# Patient Record
Sex: Female | Born: 1935 | Race: White | Hispanic: Yes | Smoking: Former smoker
Health system: Southern US, Community
[De-identification: ages and names within clinical notes are randomized; demographics above are authoritative.]

## PROBLEM LIST (undated history)

## (undated) DIAGNOSIS — M810 Age-related osteoporosis without current pathological fracture: Secondary | ICD-10-CM

## (undated) DIAGNOSIS — T783XXA Angioneurotic edema, initial encounter: Secondary | ICD-10-CM

## (undated) DIAGNOSIS — F329 Major depressive disorder, single episode, unspecified: Secondary | ICD-10-CM

## (undated) DIAGNOSIS — T464X5A Adverse effect of angiotensin-converting-enzyme inhibitors, initial encounter: Secondary | ICD-10-CM

## (undated) DIAGNOSIS — F0390 Unspecified dementia without behavioral disturbance: Secondary | ICD-10-CM

## (undated) DIAGNOSIS — E119 Type 2 diabetes mellitus without complications: Secondary | ICD-10-CM

## (undated) DIAGNOSIS — I10 Essential (primary) hypertension: Secondary | ICD-10-CM

## (undated) DIAGNOSIS — K5790 Diverticulosis of intestine, part unspecified, without perforation or abscess without bleeding: Secondary | ICD-10-CM

## (undated) DIAGNOSIS — F209 Schizophrenia, unspecified: Secondary | ICD-10-CM

## (undated) DIAGNOSIS — F32A Depression, unspecified: Secondary | ICD-10-CM

## (undated) DIAGNOSIS — K219 Gastro-esophageal reflux disease without esophagitis: Secondary | ICD-10-CM

## (undated) HISTORY — DX: Gastro-esophageal reflux disease without esophagitis: K21.9

## (undated) HISTORY — DX: Age-related osteoporosis without current pathological fracture: M81.0

## (undated) HISTORY — DX: Essential (primary) hypertension: I10

## (undated) HISTORY — DX: Diverticulosis of intestine, part unspecified, without perforation or abscess without bleeding: K57.90

## (undated) HISTORY — DX: Depression, unspecified: F32.A

## (undated) HISTORY — DX: Schizophrenia, unspecified: F20.9

## (undated) HISTORY — DX: Type 2 diabetes mellitus without complications: E11.9

## (undated) HISTORY — DX: Unspecified dementia, unspecified severity, without behavioral disturbance, psychotic disturbance, mood disturbance, and anxiety: F03.90

## (undated) HISTORY — DX: Major depressive disorder, single episode, unspecified: F32.9

## (undated) HISTORY — DX: Adverse effect of angiotensin-converting-enzyme inhibitors, initial encounter: T78.3XXA

## (undated) HISTORY — DX: Angioneurotic edema, initial encounter: T46.4X5A

---

## 2009-09-07 ENCOUNTER — Ambulatory Visit: Payer: Self-pay | Admitting: Cardiology

## 2010-08-16 ENCOUNTER — Emergency Department (HOSPITAL_COMMUNITY)
Admission: EM | Admit: 2010-08-16 | Discharge: 2010-08-16 | Disposition: A | Discharge status: Home / Self Care | Payer: Medicare Other | Attending: Emergency Medicine | Admitting: Emergency Medicine

## 2010-08-16 DIAGNOSIS — R220 Localized swelling, mass and lump, head: Secondary | ICD-10-CM | POA: Insufficient documentation

## 2010-08-16 DIAGNOSIS — E119 Type 2 diabetes mellitus without complications: Secondary | ICD-10-CM | POA: Insufficient documentation

## 2010-08-16 DIAGNOSIS — I10 Essential (primary) hypertension: Secondary | ICD-10-CM | POA: Insufficient documentation

## 2010-08-16 DIAGNOSIS — T783XXA Angioneurotic edema, initial encounter: Secondary | ICD-10-CM | POA: Insufficient documentation

## 2010-08-16 DIAGNOSIS — E0590 Thyrotoxicosis, unspecified without thyrotoxic crisis or storm: Secondary | ICD-10-CM | POA: Insufficient documentation

## 2010-08-16 DIAGNOSIS — I2510 Atherosclerotic heart disease of native coronary artery without angina pectoris: Secondary | ICD-10-CM | POA: Insufficient documentation

## 2010-08-16 DIAGNOSIS — Z79899 Other long term (current) drug therapy: Secondary | ICD-10-CM | POA: Insufficient documentation

## 2010-08-16 DIAGNOSIS — X58XXXA Exposure to other specified factors, initial encounter: Secondary | ICD-10-CM | POA: Insufficient documentation

## 2010-08-16 DIAGNOSIS — R059 Cough, unspecified: Secondary | ICD-10-CM | POA: Insufficient documentation

## 2010-08-16 DIAGNOSIS — R05 Cough: Secondary | ICD-10-CM | POA: Insufficient documentation

## 2010-12-05 ENCOUNTER — Other Ambulatory Visit: Payer: Self-pay | Admitting: Family Medicine

## 2010-12-05 DIAGNOSIS — Z1231 Encounter for screening mammogram for malignant neoplasm of breast: Secondary | ICD-10-CM

## 2010-12-17 ENCOUNTER — Ambulatory Visit
Admission: RE | Admit: 2010-12-17 | Discharge: 2010-12-17 | Disposition: A | Discharge status: Home / Self Care | Payer: Medicare Other | Source: Ambulatory Visit | Attending: Family Medicine | Admitting: Family Medicine

## 2010-12-17 DIAGNOSIS — Z1231 Encounter for screening mammogram for malignant neoplasm of breast: Secondary | ICD-10-CM

## 2011-03-05 LAB — HM COLONOSCOPY

## 2011-06-24 DIAGNOSIS — F200 Paranoid schizophrenia: Secondary | ICD-10-CM | POA: Diagnosis not present

## 2011-09-23 DIAGNOSIS — F259 Schizoaffective disorder, unspecified: Secondary | ICD-10-CM | POA: Diagnosis not present

## 2011-12-11 DIAGNOSIS — R091 Pleurisy: Secondary | ICD-10-CM | POA: Diagnosis not present

## 2011-12-11 DIAGNOSIS — I10 Essential (primary) hypertension: Secondary | ICD-10-CM | POA: Diagnosis not present

## 2011-12-11 DIAGNOSIS — E119 Type 2 diabetes mellitus without complications: Secondary | ICD-10-CM | POA: Diagnosis not present

## 2011-12-15 ENCOUNTER — Other Ambulatory Visit: Payer: Self-pay | Admitting: Family Medicine

## 2011-12-15 DIAGNOSIS — R109 Unspecified abdominal pain: Secondary | ICD-10-CM

## 2011-12-16 DIAGNOSIS — F259 Schizoaffective disorder, unspecified: Secondary | ICD-10-CM | POA: Diagnosis not present

## 2011-12-17 ENCOUNTER — Other Ambulatory Visit: Payer: Medicare Other

## 2011-12-22 ENCOUNTER — Ambulatory Visit
Admission: RE | Admit: 2011-12-22 | Discharge: 2011-12-22 | Disposition: A | Discharge status: Home / Self Care | Payer: Medicare Other | Source: Ambulatory Visit | Attending: Family Medicine | Admitting: Family Medicine

## 2011-12-22 DIAGNOSIS — R109 Unspecified abdominal pain: Secondary | ICD-10-CM

## 2011-12-22 DIAGNOSIS — R143 Flatulence: Secondary | ICD-10-CM | POA: Diagnosis not present

## 2011-12-22 DIAGNOSIS — R142 Eructation: Secondary | ICD-10-CM | POA: Diagnosis not present

## 2011-12-22 DIAGNOSIS — R141 Gas pain: Secondary | ICD-10-CM | POA: Diagnosis not present

## 2011-12-22 MED ORDER — IOHEXOL 300 MG/ML  SOLN
125.0000 mL | Freq: Once | INTRAMUSCULAR | Status: AC | PRN
  Administered 2011-12-22: 125 mL via INTRAVENOUS

## 2012-02-02 DIAGNOSIS — H905 Unspecified sensorineural hearing loss: Secondary | ICD-10-CM | POA: Diagnosis not present

## 2012-02-02 DIAGNOSIS — H9319 Tinnitus, unspecified ear: Secondary | ICD-10-CM | POA: Diagnosis not present

## 2012-02-26 DIAGNOSIS — H9190 Unspecified hearing loss, unspecified ear: Secondary | ICD-10-CM | POA: Diagnosis not present

## 2012-02-26 DIAGNOSIS — H9120 Sudden idiopathic hearing loss, unspecified ear: Secondary | ICD-10-CM | POA: Diagnosis not present

## 2012-02-26 DIAGNOSIS — H905 Unspecified sensorineural hearing loss: Secondary | ICD-10-CM | POA: Diagnosis not present

## 2012-03-16 DIAGNOSIS — F259 Schizoaffective disorder, unspecified: Secondary | ICD-10-CM | POA: Diagnosis not present

## 2012-03-19 DIAGNOSIS — H905 Unspecified sensorineural hearing loss: Secondary | ICD-10-CM | POA: Diagnosis not present

## 2012-03-23 ENCOUNTER — Other Ambulatory Visit: Payer: Self-pay | Admitting: Otolaryngology

## 2012-03-23 DIAGNOSIS — H9120 Sudden idiopathic hearing loss, unspecified ear: Secondary | ICD-10-CM

## 2012-03-23 DIAGNOSIS — H905 Unspecified sensorineural hearing loss: Secondary | ICD-10-CM

## 2012-03-23 DIAGNOSIS — H9121 Sudden idiopathic hearing loss, right ear: Secondary | ICD-10-CM

## 2012-03-29 DIAGNOSIS — H9120 Sudden idiopathic hearing loss, unspecified ear: Secondary | ICD-10-CM | POA: Diagnosis not present

## 2012-03-29 DIAGNOSIS — H905 Unspecified sensorineural hearing loss: Secondary | ICD-10-CM | POA: Diagnosis not present

## 2012-03-30 ENCOUNTER — Ambulatory Visit
Admission: RE | Admit: 2012-03-30 | Discharge: 2012-03-30 | Disposition: A | Discharge status: Home / Self Care | Payer: Medicare Other | Source: Ambulatory Visit | Attending: Otolaryngology | Admitting: Otolaryngology

## 2012-03-30 DIAGNOSIS — H905 Unspecified sensorineural hearing loss: Secondary | ICD-10-CM

## 2012-03-30 DIAGNOSIS — H9120 Sudden idiopathic hearing loss, unspecified ear: Secondary | ICD-10-CM | POA: Diagnosis not present

## 2012-03-30 DIAGNOSIS — H9121 Sudden idiopathic hearing loss, right ear: Secondary | ICD-10-CM

## 2012-03-30 MED ORDER — GADOBENATE DIMEGLUMINE 529 MG/ML IV SOLN
20.0000 mL | Freq: Once | INTRAVENOUS | Status: AC | PRN
  Administered 2012-03-30: 20 mL via INTRAVENOUS

## 2012-05-18 DIAGNOSIS — H905 Unspecified sensorineural hearing loss: Secondary | ICD-10-CM | POA: Diagnosis not present

## 2012-05-18 DIAGNOSIS — E119 Type 2 diabetes mellitus without complications: Secondary | ICD-10-CM | POA: Diagnosis not present

## 2012-05-18 DIAGNOSIS — H903 Sensorineural hearing loss, bilateral: Secondary | ICD-10-CM | POA: Diagnosis not present

## 2012-07-06 DIAGNOSIS — F259 Schizoaffective disorder, unspecified: Secondary | ICD-10-CM | POA: Diagnosis not present

## 2012-07-08 ENCOUNTER — Other Ambulatory Visit: Payer: Self-pay

## 2012-07-08 DIAGNOSIS — Z1231 Encounter for screening mammogram for malignant neoplasm of breast: Secondary | ICD-10-CM

## 2012-07-26 ENCOUNTER — Ambulatory Visit: Payer: Medicare Other

## 2012-08-10 ENCOUNTER — Ambulatory Visit: Payer: Medicare Other

## 2012-08-30 ENCOUNTER — Ambulatory Visit
Admission: RE | Admit: 2012-08-30 | Discharge: 2012-08-30 | Disposition: A | Discharge status: Home / Self Care | Payer: Medicare Other | Source: Ambulatory Visit

## 2012-08-30 DIAGNOSIS — Z1231 Encounter for screening mammogram for malignant neoplasm of breast: Secondary | ICD-10-CM

## 2012-09-21 ENCOUNTER — Telehealth: Payer: Self-pay | Admitting: Family Medicine

## 2012-09-21 MED ORDER — MEMANTINE HCL 10 MG PO TABS: 10.0000 mg | ORAL_TABLET | Freq: Two times a day (BID) | ORAL | Status: DC

## 2012-09-21 NOTE — Telephone Encounter (Signed)
rx script

## 2012-09-28 DIAGNOSIS — F259 Schizoaffective disorder, unspecified: Secondary | ICD-10-CM | POA: Diagnosis not present

## 2012-11-08 ENCOUNTER — Other Ambulatory Visit: Payer: Self-pay | Admitting: Family Medicine

## 2012-11-15 ENCOUNTER — Other Ambulatory Visit: Payer: Self-pay | Admitting: Family Medicine

## 2012-12-21 DIAGNOSIS — F259 Schizoaffective disorder, unspecified: Secondary | ICD-10-CM | POA: Diagnosis not present

## 2013-01-28 DIAGNOSIS — E119 Type 2 diabetes mellitus without complications: Secondary | ICD-10-CM | POA: Diagnosis not present

## 2013-02-07 ENCOUNTER — Ambulatory Visit (INDEPENDENT_AMBULATORY_CARE_PROVIDER_SITE_OTHER): Payer: Medicare Other | Admitting: Family Medicine

## 2013-02-07 ENCOUNTER — Encounter: Payer: Self-pay | Admitting: Family Medicine

## 2013-02-07 VITALS — BP 110/58 | HR 80 | Temp 98.5°F | Resp 18 | Ht 67.0 in | Wt 234.0 lb

## 2013-02-07 DIAGNOSIS — E039 Hypothyroidism, unspecified: Secondary | ICD-10-CM | POA: Diagnosis not present

## 2013-02-07 DIAGNOSIS — Z23 Encounter for immunization: Secondary | ICD-10-CM | POA: Diagnosis not present

## 2013-02-07 DIAGNOSIS — I10 Essential (primary) hypertension: Secondary | ICD-10-CM | POA: Diagnosis not present

## 2013-02-07 DIAGNOSIS — F209 Schizophrenia, unspecified: Secondary | ICD-10-CM | POA: Insufficient documentation

## 2013-02-07 DIAGNOSIS — F329 Major depressive disorder, single episode, unspecified: Secondary | ICD-10-CM | POA: Insufficient documentation

## 2013-02-07 DIAGNOSIS — T464X5A Adverse effect of angiotensin-converting-enzyme inhibitors, initial encounter: Secondary | ICD-10-CM | POA: Insufficient documentation

## 2013-02-07 DIAGNOSIS — E119 Type 2 diabetes mellitus without complications: Secondary | ICD-10-CM | POA: Diagnosis not present

## 2013-02-07 DIAGNOSIS — F0390 Unspecified dementia without behavioral disturbance: Secondary | ICD-10-CM | POA: Insufficient documentation

## 2013-02-07 DIAGNOSIS — K5790 Diverticulosis of intestine, part unspecified, without perforation or abscess without bleeding: Secondary | ICD-10-CM | POA: Insufficient documentation

## 2013-02-07 LAB — COMPLETE METABOLIC PANEL WITH GFR
ALT: 13 U/L (ref 0–35)
Albumin: 4 g/dL (ref 3.5–5.2)
Alkaline Phosphatase: 57 U/L (ref 39–117)
BUN: 13 mg/dL (ref 6–23)
CO2: 29 mEq/L (ref 19–32)
Chloride: 95 mEq/L — ABNORMAL LOW (ref 96–112)
GFR, Est African American: 78 mL/min
GFR, Est Non African American: 68 mL/min
Glucose, Bld: 178 mg/dL — ABNORMAL HIGH (ref 70–99)
Potassium: 4 mEq/L (ref 3.5–5.3)
Sodium: 134 mEq/L — ABNORMAL LOW (ref 135–145)
Total Bilirubin: 0.5 mg/dL (ref 0.3–1.2)
Total Protein: 7.3 g/dL (ref 6.0–8.3)

## 2013-02-07 LAB — TSH: TSH: 1.449 u[IU]/mL (ref 0.350–4.500)

## 2013-02-07 NOTE — Progress Notes (Signed)
Subjective:    Patient ID: Bonnie Watkins, female    DOB: [birth year], 77 y.o.   MRN: [id number]  HPI Patient is a pleasant 78 year old Hispanic female who comes in today for followup of her medical problems. She has diabetes mellitus type 2. She denies any burning pain in her feet.  Currently she is on metformin 1000 mg by mouth twice a day and Lantus 12 units subcutaneous each bedtime. Her fasting blood sugars tend to range around 100. Her two-hour postprandial sugars average around 150. Recently she has been having episodes of hypoglycemia at bedtime into the 60s.  She is not currently taking an aspirin. She's not had an eye exam in over 2 years.  Chest has hypertension. She is a history of angioedema on ACE inhibitors and angiotensin receptor blockers. Currently she is on amlodipine 10 mg by mouth daily, hydrochlorothiazide 25 mg by mouth daily. She denies any chest pain, shortness of breath, or dyspnea exertion.  Chest has hypothyroidism. She is currently on thyroxine 50 mcg by mouth daily. She is overdue for TSH. She denies any fatigue, depression, or alopecia, or constipation.  She requests her flu shot today in clinic. Past Medical History  Diagnosis Date  . Diabetes mellitus without complication   . Schizophrenia   . Hypertension   . Dementia   . Depression   . GERD (gastroesophageal reflux disease)   . ACE inhibitor-aggravated angioedema   . Diverticulosis    Current Outpatient Prescriptions on File Prior to Visit  Medication Sig Dispense Refill  . memantine (NAMENDA) 10 MG tablet Take 1 tablet (10 mg total) by mouth 2 (two) times daily.  60 tablet  2  . metFORMIN (GLUCOPHAGE) 1000 MG tablet TAKE (1) TABLET BY MOUTH TWICE DAILY.  60 tablet  5  . SAFETY-LOK INSULIN SYR 1CC/29G 29G X 1/2" 1 ML MISC USE AS DIRECTED.  100 each  3   No current facility-administered medications on file prior to visit.   Allergies  Allergen Reactions  . Ace Inhibitors   . Angiotensin  Receptor Blockers   . Lisinopril     Angioedema  . Robaxin [Methocarbamol] Rash   History   Social History  . Marital Status: Divorced    Spouse Name: N/A    Number of Children: N/A  . Years of Education: N/A   Occupational History  . Not on file.   Social History Main Topics  . Smoking status: Former Games developer  . Smokeless tobacco: Not on file  . Alcohol Use: No  . Drug Use: No  . Sexual Activity: No     Comment: lives at [facility].     Other Topics Concern  . Not on file   Social History Narrative  . No narrative on file      Review of Systems  All other systems reviewed and are negative.       Objective:   Physical Exam  Vitals reviewed. Constitutional: She is oriented to person, place, and time. She appears well-developed and well-nourished. No distress.  HENT:  Right Ear: External ear normal.  Left Ear: External ear normal.  Nose: Nose normal.  Mouth/Throat: Oropharynx is clear and moist. No oropharyngeal exudate.  Eyes: Conjunctivae are normal. Pupils are equal, round, and reactive to light.  Neck: Normal range of motion. Neck supple. No JVD present. No thyromegaly present.  Cardiovascular: Normal rate, regular rhythm, normal heart sounds and intact distal pulses.  Exam reveals no gallop and no friction rub.   No  murmur heard. Pulmonary/Chest: Effort normal and breath sounds normal. No respiratory distress. She has no wheezes. She has no rales. She exhibits no tenderness.  Abdominal: Soft. Bowel sounds are normal. She exhibits no distension and no mass. There is no tenderness. There is no rebound and no guarding.  Musculoskeletal: Normal range of motion. She exhibits no edema and no tenderness.  Lymphadenopathy:    She has no cervical adenopathy.  Neurological: She is alert and oriented to person, place, and time. No cranial nerve deficit. Coordination normal.  Skin: No rash noted. She is not diaphoretic.          Assessment & Plan:  1. Type  II or unspecified type diabetes mellitus without mention of complication, not stated as uncontrolled Check hemoglobin A1c. If it is less than 6.5 I would decrease Lantus substantially to prevent hypoglycemia. I will also schedule the patient for diabetic eye exam. Also recommended aspirin 81 mg by mouth every morning.  I recommended the patient return fasting for a fasting lipid panel. - COMPLETE METABOLIC PANEL WITH GFR - Hemoglobin A1c  2. Unspecified hypothyroidism Check TSH and titrate levothyroxin to achieve a TSH within therapeutic range. - TSH  3. HTN (hypertension) Blood pressure is currently well controlled. No changes in her present medications at this time. I did recommend that the patient begin regular aerobic exercise due to her obesity and deconditioning. I recommended starting slow with low intensity exercise and gradually increasing it as tolerated. - COMPLETE METABOLIC PANEL WITH GFR

## 2013-02-25 DIAGNOSIS — H2510 Age-related nuclear cataract, unspecified eye: Secondary | ICD-10-CM | POA: Diagnosis not present

## 2013-02-25 DIAGNOSIS — H40019 Open angle with borderline findings, low risk, unspecified eye: Secondary | ICD-10-CM | POA: Diagnosis not present

## 2013-02-25 DIAGNOSIS — E119 Type 2 diabetes mellitus without complications: Secondary | ICD-10-CM | POA: Diagnosis not present

## 2013-02-25 DIAGNOSIS — H35039 Hypertensive retinopathy, unspecified eye: Secondary | ICD-10-CM | POA: Diagnosis not present

## 2013-02-25 DIAGNOSIS — H02839 Dermatochalasis of unspecified eye, unspecified eyelid: Secondary | ICD-10-CM | POA: Diagnosis not present

## 2013-02-25 LAB — HM DIABETES EYE EXAM

## 2013-04-15 ENCOUNTER — Encounter: Payer: Self-pay | Admitting: Family Medicine

## 2013-04-27 ENCOUNTER — Other Ambulatory Visit: Payer: Self-pay | Admitting: Family Medicine

## 2013-05-13 ENCOUNTER — Other Ambulatory Visit: Payer: Self-pay | Admitting: Family Medicine

## 2013-05-17 ENCOUNTER — Other Ambulatory Visit: Payer: Self-pay | Admitting: Family Medicine

## 2013-05-18 ENCOUNTER — Other Ambulatory Visit: Payer: Self-pay | Admitting: Family Medicine

## 2013-05-23 ENCOUNTER — Other Ambulatory Visit: Payer: Self-pay | Admitting: Family Medicine

## 2013-05-27 ENCOUNTER — Other Ambulatory Visit: Payer: Self-pay | Admitting: Family Medicine

## 2013-05-31 ENCOUNTER — Telehealth: Payer: Self-pay | Admitting: Family Medicine

## 2013-05-31 NOTE — Telephone Encounter (Signed)
Pts resident nurse called and states that they have had an outbreak of positive flu and wants to know if we want to treat her prophylaxis for it?  Per Dr. Dennard Schaumann ok to do Tamiflu 75mg  1 po qd x 10 days. Rx faxed to her residence.

## 2013-06-03 ENCOUNTER — Other Ambulatory Visit: Payer: Self-pay | Admitting: Family Medicine

## 2013-06-03 NOTE — Telephone Encounter (Signed)
Last OV 02/07/13.  Do  not see any recent Rf on tramadol  OK refill?

## 2013-06-03 NOTE — Telephone Encounter (Signed)
rx faxed to pharmacy

## 2013-06-06 ENCOUNTER — Other Ambulatory Visit: Payer: Self-pay | Admitting: Family Medicine

## 2013-06-06 DIAGNOSIS — F259 Schizoaffective disorder, unspecified: Secondary | ICD-10-CM | POA: Diagnosis not present

## 2013-06-23 ENCOUNTER — Other Ambulatory Visit: Payer: Self-pay | Admitting: Family Medicine

## 2013-06-23 NOTE — Telephone Encounter (Signed)
Medication refilled per protocol. 

## 2013-07-02 DIAGNOSIS — H40019 Open angle with borderline findings, low risk, unspecified eye: Secondary | ICD-10-CM | POA: Diagnosis not present

## 2013-07-06 ENCOUNTER — Other Ambulatory Visit: Payer: Self-pay | Admitting: Family Medicine

## 2013-07-12 ENCOUNTER — Other Ambulatory Visit: Payer: Self-pay | Admitting: Family Medicine

## 2013-07-13 NOTE — Telephone Encounter (Signed)
Refill appropriate and filled per protocol. 

## 2013-07-14 ENCOUNTER — Other Ambulatory Visit: Payer: Self-pay | Admitting: Family Medicine

## 2013-07-14 NOTE — Telephone Encounter (Signed)
Refill appropriate and filled per protocol. 

## 2013-07-18 ENCOUNTER — Other Ambulatory Visit: Payer: Self-pay | Admitting: Family Medicine

## 2013-07-26 ENCOUNTER — Other Ambulatory Visit: Payer: Self-pay | Admitting: Family Medicine

## 2013-07-26 NOTE — Telephone Encounter (Signed)
Medication refilled per protocol. 

## 2013-08-02 ENCOUNTER — Other Ambulatory Visit: Payer: Self-pay | Admitting: Family Medicine

## 2013-08-02 NOTE — Telephone Encounter (Signed)
Medication refilled per protocol. 

## 2013-08-05 ENCOUNTER — Other Ambulatory Visit: Payer: Self-pay | Admitting: Family Medicine

## 2013-08-05 ENCOUNTER — Encounter: Payer: Self-pay | Admitting: Family Medicine

## 2013-08-05 NOTE — Telephone Encounter (Signed)
Ok to rf x 1 but ntbs

## 2013-08-05 NOTE — Telephone Encounter (Signed)
Last Rf 2/25 # 60.  Last OV 02/07/13.  OK refill?

## 2013-08-05 NOTE — Telephone Encounter (Signed)
RX faxed to pharmacy.  Letter to patient to make appt. 

## 2013-09-02 ENCOUNTER — Other Ambulatory Visit: Payer: Self-pay | Admitting: Family Medicine

## 2013-09-02 NOTE — Telephone Encounter (Signed)
Ok to refill??  Last office visit 02/07/2013.  Last refill 08/05/2013.

## 2013-09-02 NOTE — Telephone Encounter (Signed)
Medication called to pharmacy. 

## 2013-09-02 NOTE — Telephone Encounter (Signed)
ok 

## 2013-09-16 ENCOUNTER — Ambulatory Visit (INDEPENDENT_AMBULATORY_CARE_PROVIDER_SITE_OTHER): Payer: Medicare Other | Admitting: Family Medicine

## 2013-09-16 ENCOUNTER — Encounter: Payer: Self-pay | Admitting: Family Medicine

## 2013-09-16 VITALS — BP 128/66 | HR 60 | Temp 97.6°F | Resp 18 | Wt 234.0 lb

## 2013-09-16 DIAGNOSIS — E119 Type 2 diabetes mellitus without complications: Secondary | ICD-10-CM | POA: Diagnosis not present

## 2013-09-16 DIAGNOSIS — I10 Essential (primary) hypertension: Secondary | ICD-10-CM

## 2013-09-16 DIAGNOSIS — Z23 Encounter for immunization: Secondary | ICD-10-CM | POA: Diagnosis not present

## 2013-09-16 DIAGNOSIS — E039 Hypothyroidism, unspecified: Secondary | ICD-10-CM | POA: Diagnosis not present

## 2013-09-16 LAB — COMPLETE METABOLIC PANEL WITH GFR
ALBUMIN: 3.7 g/dL (ref 3.5–5.2)
ALT: 10 U/L (ref 0–35)
AST: 13 U/L (ref 0–37)
Alkaline Phosphatase: 58 U/L (ref 39–117)
BUN: 11 mg/dL (ref 6–23)
CALCIUM: 8.7 mg/dL (ref 8.4–10.5)
CHLORIDE: 97 meq/L (ref 96–112)
CO2: 29 mEq/L (ref 19–32)
Creat: 0.69 mg/dL (ref 0.50–1.10)
GFR, Est Non African American: 84 mL/min
Glucose, Bld: 105 mg/dL — ABNORMAL HIGH (ref 70–99)
Potassium: 4.1 mEq/L (ref 3.5–5.3)
Sodium: 136 mEq/L (ref 135–145)
Total Bilirubin: 0.5 mg/dL (ref 0.2–1.2)
Total Protein: 6.9 g/dL (ref 6.0–8.3)

## 2013-09-16 LAB — LIPID PANEL
Cholesterol: 122 mg/dL (ref 0–200)
HDL: 47 mg/dL (ref >39)
LDL CALC: 53 mg/dL (ref 0–99)
Total CHOL/HDL Ratio: 2.6 Ratio
Triglycerides: 109 mg/dL (ref <150)
VLDL: 22 mg/dL (ref 0–40)

## 2013-09-16 LAB — TSH: TSH: 3.732 u[IU]/mL (ref 0.350–4.500)

## 2013-09-16 LAB — HEMOGLOBIN A1C
HEMOGLOBIN A1C: 6.7 % — AB (ref <5.7)
MEAN PLASMA GLUCOSE: 146 mg/dL — AB (ref <117)

## 2013-09-16 NOTE — Progress Notes (Signed)
Subjective:    Patient ID: Bonnie Watkins, female    DOB: 11-Jul-1934, 79 y.o.   MRN: [id number]  HPI  Patient is here today for followup of her hypertension, hyperlipidemia, hypothyroidism, and diabetes mellitus type 2. She states her blood sugars are ranging 100-140. She is having no hypoglycemic effects. She is still taking Lantus 12 units subcutaneous daily. She denies any chest pain, shortness of breath, dyspnea on exertion. She denies any myalgias or right quadrant pain.  She denies any neuropathy in her feet. She denies any further acid reflux.  Overall she is doing well and the remainder of her review of systems is negative. Past Medical History  Diagnosis Date  . Diabetes mellitus without complication   . Schizophrenia   . Hypertension   . Dementia   . Depression   . GERD (gastroesophageal reflux disease)   . ACE inhibitor-aggravated angioedema   . Diverticulosis    No past surgical history on file. Current Outpatient Prescriptions on File Prior to Visit  Medication Sig Dispense Refill  . amLODipine (NORVASC) 10 MG tablet TAKE ONE TABLET BY MOUTH ONCE DAILY.  30 tablet  8  . ASPIRIN LOW DOSE 81 MG EC tablet TAKE ONE TABLET BY MOUTH ONCE DAILY.  30 tablet  5  . benztropine (COGENTIN) 1 MG tablet Take 1 mg by mouth 2 (two) times daily.      Marland Kitchen CALCIUM ANTACID 500 MG chewable tablet CHEW 1 TABLET BY MOUTH 3 TIMES DAILY AS NEEDED FOR INDIGESTION.  90 tablet  3  . Carboxymethylcellul-Glycerin 0.5-0.9 % SOLN 1 drop in each eye tid  15 mL  3  . citalopram (CELEXA) 20 MG tablet Take 20 mg by mouth daily.      Marland Kitchen docusate sodium (COLACE) 250 MG capsule 2 caps po bid  120 capsule  11  . hydrochlorothiazide (HYDRODIURIL) 25 MG tablet TAKE ONE TABLET BY MOUTH ONCE DAILY.  30 tablet  5  . LANTUS 100 UNIT/ML injection INJECT 12 UNITS SUBCUTANEOUSLY EACH MORNING.  10 mL  5  . levothyroxine (SYNTHROID, LEVOTHROID) 50 MCG tablet TAKE ONE TABLET BY MOUTH ONCE DAILY.  30 tablet  5  .  loratadine (CLARITIN) 10 MG tablet TAKE ONE TABLET BY MOUTH ONCE DAILY.  30 tablet  5  . memantine (NAMENDA) 10 MG tablet Take 1 tablet (10 mg total) by mouth 2 (two) times daily.  60 tablet  2  . metFORMIN (GLUCOPHAGE) 1000 MG tablet TAKE (1) TABLET BY MOUTH TWICE DAILY.  60 tablet  2  . omeprazole (PRILOSEC) 40 MG capsule TAKE (1) CAPSULE BY MOUTH ONCE DAILY.  30 capsule  5  . perphenazine (TRILAFON) 4 MG tablet Take 8 mg by mouth daily.      Marland Kitchen SAFETY-LOK INSULIN SYR 1CC/29G 29G X 1/2" 1 ML MISC USE AS DIRECTED.  100 each  3  . traMADol (ULTRAM) 50 MG tablet TAKE (1) TABLET BY MOUTH TWICE DAILY.  60 tablet  0  . traZODone (DESYREL) 150 MG tablet Take 150 mg by mouth at bedtime.       No current facility-administered medications on file prior to visit.   Allergies  Allergen Reactions  . Ace Inhibitors   . Angiotensin Receptor Blockers   . Lisinopril     Angioedema  . Robaxin [Methocarbamol] Rash   History   Social History  . Marital Status: Divorced    Spouse Name: N/A    Number of Children: N/A  . Years of Education: N/A  Occupational History  . Not on file.   Social History Main Topics  . Smoking status: Former Research scientist (life sciences)  . Smokeless tobacco: Not on file  . Alcohol Use: No  . Drug Use: No  . Sexual Activity: No     Comment: lives at [facility].     Other Topics Concern  . Not on file   Social History Narrative  . No narrative on file     Review of Systems  All other systems reviewed and are negative.      Objective:   Physical Exam  Vitals reviewed. Constitutional: She appears well-developed and well-nourished.  Neck: Neck supple. No JVD present. No thyromegaly present.  Cardiovascular: Normal rate, regular rhythm and normal heart sounds.   No murmur heard. Pulmonary/Chest: Effort normal and breath sounds normal. No respiratory distress. She has no wheezes. She has no rales.  Abdominal: Soft. Bowel sounds are normal. She exhibits no distension. There  is no tenderness. There is no rebound and no guarding.  Musculoskeletal: She exhibits no edema.  Lymphadenopathy:    She has no cervical adenopathy.          Assessment & Plan:  1. Type II or unspecified type diabetes mellitus without mention of complication, not stated as uncontrolled Check hemoglobin A1c. Goal hemoglobin A1c is less than 7.  Check fasting lipid panel. Goal LDL is less than 100. - COMPLETE METABOLIC PANEL WITH GFR - Hemoglobin A1c - Lipid panel - TSH  2. Need for prophylactic vaccination against Streptococcus pneumoniae (pneumococcus) Patient has had Pneumovax 23. She is due for Prevnar 13 which she received today. - Pneumococcal conjugate vaccine 13-valent  3. HTN (hypertension) Blood pressure is well controlled. Continue current medications at the present dosages.  4. Unspecified hypothyroidism Check TSH and titrate levo thyroxine to achieve a TSH within the brain.

## 2013-09-20 ENCOUNTER — Encounter: Payer: Self-pay | Admitting: Family Medicine

## 2013-09-26 DIAGNOSIS — F259 Schizoaffective disorder, unspecified: Secondary | ICD-10-CM | POA: Diagnosis not present

## 2013-10-04 ENCOUNTER — Other Ambulatory Visit: Payer: Self-pay | Admitting: Family Medicine

## 2013-10-05 NOTE — Telephone Encounter (Signed)
Ok to refill??  Last office visit 09/16/2013.   Last refill 09/02/2013.

## 2013-10-06 NOTE — Telephone Encounter (Signed)
Medication called to pharmacy. 

## 2013-10-06 NOTE — Telephone Encounter (Signed)
ok 

## 2013-10-12 ENCOUNTER — Other Ambulatory Visit: Payer: Self-pay | Admitting: Family Medicine

## 2013-10-17 ENCOUNTER — Other Ambulatory Visit: Payer: Self-pay | Admitting: Family Medicine

## 2013-10-17 NOTE — Telephone Encounter (Signed)
Refill appropriate and filled per protocol. 

## 2013-10-26 ENCOUNTER — Emergency Department (HOSPITAL_COMMUNITY): Payer: Medicare Other

## 2013-10-26 ENCOUNTER — Emergency Department (HOSPITAL_COMMUNITY)
Admission: EM | Admit: 2013-10-26 | Discharge: 2013-10-26 | Disposition: A | Discharge status: Home / Self Care | Payer: Medicare Other | Attending: Emergency Medicine | Admitting: Emergency Medicine

## 2013-10-26 ENCOUNTER — Encounter (HOSPITAL_COMMUNITY): Payer: Self-pay | Admitting: Emergency Medicine

## 2013-10-26 DIAGNOSIS — K449 Diaphragmatic hernia without obstruction or gangrene: Secondary | ICD-10-CM | POA: Diagnosis not present

## 2013-10-26 DIAGNOSIS — K529 Noninfective gastroenteritis and colitis, unspecified: Secondary | ICD-10-CM

## 2013-10-26 DIAGNOSIS — K5289 Other specified noninfective gastroenteritis and colitis: Secondary | ICD-10-CM | POA: Diagnosis not present

## 2013-10-26 DIAGNOSIS — E876 Hypokalemia: Secondary | ICD-10-CM

## 2013-10-26 DIAGNOSIS — R300 Dysuria: Secondary | ICD-10-CM | POA: Insufficient documentation

## 2013-10-26 DIAGNOSIS — K5730 Diverticulosis of large intestine without perforation or abscess without bleeding: Secondary | ICD-10-CM | POA: Diagnosis not present

## 2013-10-26 DIAGNOSIS — Z792 Long term (current) use of antibiotics: Secondary | ICD-10-CM | POA: Insufficient documentation

## 2013-10-26 DIAGNOSIS — K2990 Gastroduodenitis, unspecified, without bleeding: Secondary | ICD-10-CM | POA: Diagnosis not present

## 2013-10-26 DIAGNOSIS — R112 Nausea with vomiting, unspecified: Secondary | ICD-10-CM | POA: Diagnosis not present

## 2013-10-26 DIAGNOSIS — K2970 Gastritis, unspecified, without bleeding: Secondary | ICD-10-CM | POA: Diagnosis not present

## 2013-10-26 DIAGNOSIS — I10 Essential (primary) hypertension: Secondary | ICD-10-CM | POA: Diagnosis not present

## 2013-10-26 DIAGNOSIS — F329 Major depressive disorder, single episode, unspecified: Secondary | ICD-10-CM | POA: Insufficient documentation

## 2013-10-26 DIAGNOSIS — Z87891 Personal history of nicotine dependence: Secondary | ICD-10-CM | POA: Insufficient documentation

## 2013-10-26 DIAGNOSIS — F3289 Other specified depressive episodes: Secondary | ICD-10-CM | POA: Insufficient documentation

## 2013-10-26 DIAGNOSIS — Z7982 Long term (current) use of aspirin: Secondary | ICD-10-CM | POA: Insufficient documentation

## 2013-10-26 DIAGNOSIS — F209 Schizophrenia, unspecified: Secondary | ICD-10-CM | POA: Diagnosis not present

## 2013-10-26 DIAGNOSIS — R109 Unspecified abdominal pain: Secondary | ICD-10-CM | POA: Diagnosis present

## 2013-10-26 DIAGNOSIS — K219 Gastro-esophageal reflux disease without esophagitis: Secondary | ICD-10-CM | POA: Diagnosis not present

## 2013-10-26 DIAGNOSIS — Z79899 Other long term (current) drug therapy: Secondary | ICD-10-CM | POA: Diagnosis not present

## 2013-10-26 DIAGNOSIS — I714 Abdominal aortic aneurysm, without rupture, unspecified: Secondary | ICD-10-CM | POA: Diagnosis not present

## 2013-10-26 DIAGNOSIS — E119 Type 2 diabetes mellitus without complications: Secondary | ICD-10-CM | POA: Diagnosis not present

## 2013-10-26 DIAGNOSIS — R197 Diarrhea, unspecified: Secondary | ICD-10-CM | POA: Diagnosis not present

## 2013-10-26 DIAGNOSIS — F0390 Unspecified dementia without behavioral disturbance: Secondary | ICD-10-CM | POA: Insufficient documentation

## 2013-10-26 LAB — COMPREHENSIVE METABOLIC PANEL
ALBUMIN: 3.9 g/dL (ref 3.5–5.2)
ALK PHOS: 63 U/L (ref 39–117)
ALT: 27 U/L (ref 0–35)
AST: 69 U/L — AB (ref 0–37)
BUN: 9 mg/dL (ref 6–23)
CALCIUM: 9.2 mg/dL (ref 8.4–10.5)
CO2: 26 mEq/L (ref 19–32)
Chloride: 81 mEq/L — ABNORMAL LOW (ref 96–112)
Creatinine, Ser: 0.58 mg/dL (ref 0.50–1.10)
GFR calc Af Amer: >90 mL/min (ref >90)
GFR calc non Af Amer: 87 mL/min — ABNORMAL LOW (ref >90)
Glucose, Bld: 189 mg/dL — ABNORMAL HIGH (ref 70–99)
POTASSIUM: 2.7 meq/L — AB (ref 3.7–5.3)
SODIUM: 127 meq/L — AB (ref 137–147)
TOTAL PROTEIN: 7.8 g/dL (ref 6.0–8.3)
Total Bilirubin: 1.2 mg/dL (ref 0.3–1.2)

## 2013-10-26 LAB — CBC WITH DIFFERENTIAL/PLATELET
BASOS ABS: 0 10*3/uL (ref 0.0–0.1)
BASOS PCT: 0 % (ref 0–1)
EOS ABS: 0 10*3/uL (ref 0.0–0.7)
EOS PCT: 0 % (ref 0–5)
HCT: 37.2 % (ref 36.0–46.0)
Hemoglobin: 12.8 g/dL (ref 12.0–15.0)
Lymphocytes Relative: 21 % (ref 12–46)
Lymphs Abs: 2.3 10*3/uL (ref 0.7–4.0)
MCH: 30.5 pg (ref 26.0–34.0)
MCHC: 34.4 g/dL (ref 30.0–36.0)
MCV: 88.8 fL (ref 78.0–100.0)
Monocytes Absolute: 0.8 10*3/uL (ref 0.1–1.0)
Monocytes Relative: 8 % (ref 3–12)
Neutro Abs: 7.7 10*3/uL (ref 1.7–7.7)
Neutrophils Relative %: 71 % (ref 43–77)
PLATELETS: 187 10*3/uL (ref 150–400)
RBC: 4.19 MIL/uL (ref 3.87–5.11)
RDW: 12.5 % (ref 11.5–15.5)
WBC: 10.9 10*3/uL — ABNORMAL HIGH (ref 4.0–10.5)

## 2013-10-26 LAB — POTASSIUM: Potassium: 4.3 mEq/L (ref 3.7–5.3)

## 2013-10-26 LAB — URINALYSIS, ROUTINE W REFLEX MICROSCOPIC
Bilirubin Urine: NEGATIVE
GLUCOSE, UA: NEGATIVE mg/dL
Hgb urine dipstick: NEGATIVE
KETONES UR: 15 mg/dL — AB
NITRITE: NEGATIVE
PH: 6.5 (ref 5.0–8.0)
Protein, ur: NEGATIVE mg/dL
Specific Gravity, Urine: 1.008 (ref 1.005–1.030)
Urobilinogen, UA: 0.2 mg/dL (ref 0.0–1.0)

## 2013-10-26 LAB — I-STAT TROPONIN, ED: Troponin i, poc: 0.01 ng/mL (ref 0.00–0.08)

## 2013-10-26 LAB — CBG MONITORING, ED: GLUCOSE-CAPILLARY: 129 mg/dL — AB (ref 70–99)

## 2013-10-26 LAB — URINE MICROSCOPIC-ADD ON

## 2013-10-26 LAB — MAGNESIUM: Magnesium: 1.5 mg/dL (ref 1.5–2.5)

## 2013-10-26 LAB — LIPASE, BLOOD: Lipase: 47 U/L (ref 11–59)

## 2013-10-26 MED ORDER — MORPHINE SULFATE 4 MG/ML IJ SOLN
4.0000 mg | Freq: Once | INTRAMUSCULAR | Status: AC
  Administered 2013-10-26: 4 mg via INTRAVENOUS
  Filled 2013-10-26: qty 1

## 2013-10-26 MED ORDER — SODIUM CHLORIDE 0.9 % IV BOLUS (SEPSIS)
1000.0000 mL | Freq: Once | INTRAVENOUS | Status: AC
  Administered 2013-10-26: 1000 mL via INTRAVENOUS

## 2013-10-26 MED ORDER — CIPROFLOXACIN HCL 500 MG PO TABS: 500.0000 mg | ORAL_TABLET | Freq: Two times a day (BID) | ORAL | Status: DC

## 2013-10-26 MED ORDER — SODIUM CHLORIDE 0.9 % IV SOLN: Freq: Once | INTRAVENOUS | Status: DC

## 2013-10-26 MED ORDER — METRONIDAZOLE 500 MG PO TABS: 500.0000 mg | ORAL_TABLET | Freq: Two times a day (BID) | ORAL | Status: DC

## 2013-10-26 MED ORDER — ONDANSETRON HCL 4 MG/2ML IJ SOLN
4.0000 mg | Freq: Once | INTRAMUSCULAR | Status: AC
  Administered 2013-10-26: 4 mg via INTRAVENOUS

## 2013-10-26 MED ORDER — ONDANSETRON 8 MG PO TBDP: 8.0000 mg | ORAL_TABLET | Freq: Three times a day (TID) | ORAL | Status: DC | PRN

## 2013-10-26 MED ORDER — ONDANSETRON HCL 4 MG/2ML IJ SOLN
4.0000 mg | Freq: Once | INTRAMUSCULAR | Status: DC
  Filled 2013-10-26: qty 2

## 2013-10-26 MED ORDER — IOHEXOL 300 MG/ML  SOLN: 80.0000 mL | Freq: Once | INTRAMUSCULAR | Status: DC | PRN

## 2013-10-26 MED ORDER — POTASSIUM CHLORIDE CRYS ER 20 MEQ PO TBCR
40.0000 meq | EXTENDED_RELEASE_TABLET | Freq: Once | ORAL | Status: AC
  Administered 2013-10-26: 40 meq via ORAL
  Filled 2013-10-26: qty 2

## 2013-10-26 MED ORDER — LACTATED RINGERS IV SOLN
INTRAVENOUS | Status: DC
  Administered 2013-10-26: 05:00:00 via INTRAVENOUS

## 2013-10-26 NOTE — ED Notes (Signed)
Pt daughter at bedside. Will take back to [facility]. Pt dressed. Provided orange juice.

## 2013-10-26 NOTE — ED Provider Notes (Signed)
CSN: [id number]     Arrival date & time 10/26/13  0056 History   First MD Initiated Contact with Patient 10/26/13 0121     Chief Complaint  Patient presents with  . Emesis  . Abdominal Pain     (Consider location/radiation/quality/duration/timing/severity/associated sxs/prior Treatment) HPI Comments: Pt with hx of IDDM, Diverticular dz, cholecystectomy comes in to the ER with cc of abd pain. Abd pain since Saturday, constant, and described as burning pain in the epigastrium and sometimes laterally. + associated nausea and emesis - emesis x 3 all today, non bilious and non bloody. Pt also has had about 3 darkm tarry and green stools today, no bright red blood.  Pt denies antibiotic use. + mild dysuria, no hematuria or polyuria.  Patient is a 79 y.o. female presenting with vomiting and abdominal pain. The history is provided by the patient.  Emesis Associated symptoms: abdominal pain and diarrhea   Associated symptoms: no headaches   Abdominal Pain Associated symptoms: diarrhea, dysuria, nausea and vomiting   Associated symptoms: no chest pain, no constipation, no cough, no fever, no hematuria and no shortness of breath     Past Medical History  Diagnosis Date  . Diabetes mellitus without complication   . Schizophrenia   . Hypertension   . Dementia   . Depression   . GERD (gastroesophageal reflux disease)   . ACE inhibitor-aggravated angioedema   . Diverticulosis    History reviewed. No pertinent past surgical history. No family history on file. History  Substance Use Topics  . Smoking status: Former Research scientist (life sciences)  . Smokeless tobacco: Not on file  . Alcohol Use: No   OB History   Grav Para Term Preterm Abortions TAB SAB Ect Mult Living                 Review of Systems  Constitutional: Positive for activity change. Negative for fever and diaphoresis.  HENT: Negative for facial swelling.   Respiratory: Negative for cough, shortness of breath and wheezing.   Cardiovascular:  Negative for chest pain.  Gastrointestinal: Positive for nausea, vomiting, abdominal pain and diarrhea. Negative for constipation, blood in stool and abdominal distention.  Genitourinary: Positive for dysuria. Negative for hematuria and difficulty urinating.  Musculoskeletal: Negative for neck pain.  Skin: Negative for color change.  Neurological: Negative for speech difficulty and headaches.  Hematological: Does not bruise/bleed easily.  Psychiatric/Behavioral: Negative for confusion.      Allergies  Ace inhibitors; Angiotensin receptor blockers; Lisinopril; and Robaxin  Home Medications   Prior to Admission medications   Medication Sig Start Date End Date Taking? Authorizing Provider  acetaminophen (TYLENOL) 500 MG tablet Take 500 mg by mouth every 6 (six) hours as needed for mild pain.   Yes Historical Provider, MD  amLODipine (NORVASC) 10 MG tablet Take 10 mg by mouth daily.   Yes Historical Provider, MD  aspirin EC 81 MG tablet Take 81 mg by mouth daily.   Yes Historical Provider, MD  benztropine (COGENTIN) 1 MG tablet Take 1 mg by mouth 2 (two) times daily.   Yes Historical Provider, MD  calcium carbonate (TUMS - DOSED IN MG ELEMENTAL CALCIUM) 500 MG chewable tablet Chew 1 tablet by mouth 3 (three) times daily as needed for indigestion or heartburn.   Yes Historical Provider, MD  Carboxymethylcellul-Glycerin (OPTIVE) 0.5-0.9 % SOLN Place 1 drop into both eyes 3 (three) times daily.   Yes Historical Provider, MD  citalopram (CELEXA) 20 MG tablet Take 20 mg by mouth daily.  Yes Historical Provider, MD  docusate sodium (COLACE) 250 MG capsule 2 caps po bid 05/17/13  Yes Susy Frizzle, MD  hydrochlorothiazide (HYDRODIURIL) 25 MG tablet Take 25 mg by mouth daily.   Yes Historical Provider, MD  insulin glargine (LANTUS) 100 UNIT/ML injection Inject 12 Units into the skin every morning.   Yes Historical Provider, MD  levothyroxine (SYNTHROID, LEVOTHROID) 50 MCG tablet Take 50 mcg by  mouth daily before breakfast.   Yes Historical Provider, MD  loratadine (CLARITIN) 10 MG tablet Take 10 mg by mouth daily.   Yes Historical Provider, MD  memantine (NAMENDA) 10 MG tablet Take 1 tablet (10 mg total) by mouth 2 (two) times daily. 09/21/12  Yes Susy Frizzle, MD  metFORMIN (GLUCOPHAGE) 1000 MG tablet Take 1,000 mg by mouth 2 (two) times daily.   Yes Historical Provider, MD  omeprazole (PRILOSEC) 40 MG capsule Take 40 mg by mouth daily.   Yes Historical Provider, MD  perphenazine (TRILAFON) 4 MG tablet Take 6 mg by mouth every morning.    Yes Historical Provider, MD  polyethylene glycol (MIRALAX / GLYCOLAX) packet Take 17 g by mouth 2 (two) times daily as needed for mild constipation.   Yes Historical Provider, MD  QUEtiapine (SEROQUEL) 200 MG tablet Take 200 mg by mouth at bedtime.   Yes Historical Provider, MD  traMADol (ULTRAM) 50 MG tablet Take 50 mg by mouth 2 (two) times daily.   Yes Historical Provider, MD  traZODone (DESYREL) 150 MG tablet Take 150 mg by mouth at bedtime as needed for sleep.    Yes Historical Provider, MD  ciprofloxacin (CIPRO) 500 MG tablet Take 1 tablet (500 mg total) by mouth 2 (two) times daily. 10/26/13   Varney Biles, MD  metroNIDAZOLE (FLAGYL) 500 MG tablet Take 1 tablet (500 mg total) by mouth 2 (two) times daily. 10/26/13   Varney Biles, MD  ondansetron (ZOFRAN ODT) 8 MG disintegrating tablet Take 1 tablet (8 mg total) by mouth every 8 (eight) hours as needed for nausea. 10/26/13   Ankit Kathrynn Humble, MD   BP 178/85  Pulse 80  Temp(Src) 98.2 F (36.8 C) (Oral)  Resp 16  SpO2 95% Physical Exam  Constitutional: She is oriented to person, place, and time. She appears well-developed and well-nourished.  HENT:  Head: Normocephalic and atraumatic.  Eyes: EOM are normal. Pupils are equal, round, and reactive to light.  Neck: Neck supple.  Cardiovascular: Normal rate, regular rhythm and normal heart sounds.   Pulmonary/Chest: Effort normal. No  respiratory distress.  Abdominal: Soft. She exhibits no distension. There is tenderness. There is no rebound and no guarding.  Musculoskeletal: She exhibits no edema.  Neurological: She is alert and oriented to person, place, and time.  Skin: Skin is warm and dry.    ED Course  Procedures (including critical care time) Labs Review Labs Reviewed  CBC WITH DIFFERENTIAL - Abnormal; Notable for the following:    WBC 10.9 (*)    All other components within normal limits  COMPREHENSIVE METABOLIC PANEL - Abnormal; Notable for the following:    Sodium 127 (*)    Potassium 2.7 (*)    Chloride 81 (*)    Glucose, Bld 189 (*)    AST 69 (*)    GFR calc non Af Amer 87 (*)    All other components within normal limits  URINALYSIS, ROUTINE W REFLEX MICROSCOPIC - Abnormal; Notable for the following:    Ketones, ur 15 (*)    Leukocytes, UA  SMALL (*)    All other components within normal limits  URINE MICROSCOPIC-ADD ON - Abnormal; Notable for the following:    Squamous Epithelial / LPF FEW (*)    Bacteria, UA FEW (*)    All other components within normal limits  LIPASE, BLOOD  MAGNESIUM  POTASSIUM  I-STAT TROPOININ, ED    Imaging Review Ct Abdomen Pelvis W Contrast  10/26/2013   CLINICAL DATA:  Abdominal pain and diarrhea.  Nausea.  EXAM: CT ABDOMEN AND PELVIS WITH CONTRAST  TECHNIQUE: Multidetector CT imaging of the abdomen and pelvis was performed using the standard protocol following bolus administration of intravenous contrast.  CONTRAST:  80 mL of Omnipaque 300 IV contrast  COMPARISON:  CT of the abdomen and pelvis from 12/22/2011  FINDINGS: The visualized lung bases are clear.  A tiny hiatal hernia is noted.  Mild prominence of the intrahepatic biliary ducts remains within normal limits status post cholecystectomy. Clips are noted along the gallbladder fossa. The liver is otherwise unremarkable in appearance; a clip is noted along the hepatic dome. The spleen contains a calcified granuloma  but is otherwise unremarkable. The pancreas and adrenal glands are unremarkable; there is diffuse fatty infiltration within the pancreas.  The kidneys are unremarkable in appearance. There is no evidence of hydronephrosis. No renal or ureteral stones are seen. No perinephric stranding is appreciated.  No free fluid is identified. The small bowel is unremarkable in appearance. The stomach is within normal limits. No acute vascular abnormalities are seen. There is mild calcification along the abdominal aorta and its branches.  The appendix is not well characterized. There is no evidence for appendicitis. There is slight prominence of vasculature about the colon; the colon is grossly unremarkable in appearance.  The bladder is moderately distended and grossly unremarkable in appearance. The patient is status post hysterectomy. No suspicious adnexal masses are seen. No inguinal lymphadenopathy is seen.  No acute osseous abnormalities are identified. Vacuum phenomenon is noted at L3-L4 and L5-S1. There is mild chronic loss of height at L2.  IMPRESSION: 1. No acute abnormality seen within the abdomen or pelvis. 2. Slight prominence of the vasculature about the colon may reflect chronic sequelae of inflammation, or perhaps a minimal infectious process. This is somewhat better characterized than on the prior study. 3. Tiny hiatal hernia seen. 4. Mild calcification along the abdominal aorta and its branches.   Electronically Signed   By: Garald Balding M.D.   On: 10/26/2013 06:21     EKG Interpretation   Date/Time:  Wednesday October 26 2013 01:07:26 EDT Ventricular Rate:  82 PR Interval:  216 QRS Duration: 101 QT Interval:  438 QTC Calculation: 512 R Axis:   1 Text Interpretation:  Sinus rhythm Borderline prolonged PR interval  Abnormal R-wave progression, early transition Nonspecific T abnormalities,  diffuse leads Prolonged QT interval Confirmed by Kathrynn Humble, MD, ANKIT  [phone]) on 10/26/2013 1:17:37 AM       MDM   Final diagnoses:  Hypokalemia  Colitis  Hyponatremia   Pt comes in w/ cc of epigastric pain, emesis, diarrhea. She has mild leukocytosis, and tenderness on exam. CT scan ordered, and shows mild colitis like picture.  Patient has hyponatremia and hypokalemia. Along with LR for fluids, patient received 40 meq x 2 of K orally.  Pt passed po challenge. IV AB in the ER given, and patient discharged with stable vitals.  Pt advised to see her PCP in 3-5 days.     Varney Biles, MD 10/26/13  0853 

## 2013-10-26 NOTE — ED Notes (Signed)
PER EMS: pt from [facility] home, reports abdominal pain and diarrhea for 2 days. Pt also very nauseous and burping up/throwing up bile. A&Ox4.

## 2013-10-26 NOTE — ED Notes (Signed)
Potassium collected by phlebotomy, waiting for results, Then can discharge.

## 2013-10-26 NOTE — Discharge Instructions (Signed)
Colitis Colitis is inflammation of the colon. Colitis can be a short-term or long-standing (chronic) illness. Crohn's disease and ulcerative colitis are 2 types of colitis which are chronic. They usually require lifelong treatment. CAUSES  There are many different causes of colitis, including:  Viruses.  Germs (bacteria).  Medicine reactions. SYMPTOMS   Diarrhea.  Intestinal bleeding.  Pain.  Fever.  Throwing up (vomiting).  Tiredness (fatigue).  Weight loss.  Bowel blockage. DIAGNOSIS  The diagnosis of colitis is based on examination and stool or blood tests. X-rays, CT scan, and colonoscopy may also be needed. TREATMENT  Treatment may include:  Fluids given through the vein (intravenously).  Bowel rest (nothing to eat or drink for a period of time).  Medicine for pain and diarrhea.  Medicines (antibiotics) that kill germs.  Cortisone medicines.  Surgery. HOME CARE INSTRUCTIONS   Get plenty of rest.  Drink enough water and fluids to keep your urine clear or pale yellow.  Eat a well-balanced diet.  Call your caregiver for follow-up as recommended. SEEK IMMEDIATE MEDICAL CARE IF:   You develop chills.  You have an oral temperature above 102 F (38.9 C), not controlled by medicine.  You have extreme weakness, fainting, or dehydration.  You have repeated vomiting.  You develop severe belly (abdominal) pain or are passing bloody or tarry stools. MAKE SURE YOU:   Understand these instructions.  Will watch your condition.  Will get help right away if you are not doing well or get worse. Document Released: 06/05/2004 Document Revised: 07/21/2011 Document Reviewed: 08/31/2009 [facility] Patient Information 2015 [place], [place]. This information is not intended to replace advice given to you by your health care provider. Make sure you discuss any questions you have with your health care provider.  Hypokalemia Hypokalemia means that the amount of  potassium in the blood is lower than normal.Potassium is a chemical, called an electrolyte, that helps regulate the amount of fluid in the body. It also stimulates muscle contraction and helps nerves function properly.Most of the body's potassium is inside of cells, and only a very small amount is in the blood. Because the amount in the blood is so small, minor changes can be life-threatening. CAUSES  Antibiotics.  Diarrhea or vomiting.  Using laxatives too much, which can cause diarrhea.  Chronic kidney disease.  Water pills (diuretics).  Eating disorders (bulimia).  Low magnesium level.  Sweating a lot. SIGNS AND SYMPTOMS  Weakness.  Constipation.  Fatigue.  Muscle cramps.  Mental confusion.  Skipped heartbeats or irregular heartbeat (palpitations).  Tingling or numbness. DIAGNOSIS  Your health care provider can diagnose hypokalemia with blood tests. In addition to checking your potassium level, your health care provider may also check other lab tests. TREATMENT Hypokalemia can be treated with potassium supplements taken by mouth or adjustments in your current medicines. If your potassium level is very low, you may need to get potassium through a vein (IV) and be monitored in the hospital. A diet high in potassium is also helpful. Foods high in potassium are:  Nuts, such as peanuts and pistachios.  Seeds, such as sunflower seeds and pumpkin seeds.  Peas, lentils, and lima beans.  Whole grain and bran cereals and breads.  Fresh fruit and vegetables, such as apricots, avocado, bananas, cantaloupe, kiwi, oranges, tomatoes, asparagus, and potatoes.  Orange and tomato juices.  Red meats.  Fruit yogurt. HOME CARE INSTRUCTIONS  Take all medicines as prescribed by your health care provider.  Maintain a healthy diet by including  nutritious food, such as fruits, vegetables, nuts, whole grains, and lean meats.  If you are taking a laxative, be sure to follow the  directions on the label. SEEK MEDICAL CARE IF:  Your weakness gets worse.  You feel your heart pounding or racing.  You are vomiting or having diarrhea.  You are diabetic and having trouble keeping your blood glucose in the normal range. SEEK IMMEDIATE MEDICAL CARE IF:  You have chest pain, shortness of breath, or dizziness.  You are vomiting or having diarrhea for more than 2 days.  You faint. MAKE SURE YOU:   Understand these instructions.  Will watch your condition.  Will get help right away if you are not doing well or get worse. Document Released: 04/28/2005 Document Revised: 02/16/2013 Document Reviewed: 10/29/2012 [facility] Patient Information 2015 [place], [place]. This information is not intended to replace advice given to you by your health care provider. Make sure you discuss any questions you have with your health care provider.

## 2013-10-31 ENCOUNTER — Other Ambulatory Visit: Payer: Self-pay | Admitting: Family Medicine

## 2013-10-31 NOTE — Telephone Encounter (Signed)
ok 

## 2013-10-31 NOTE — Telephone Encounter (Signed)
Ok to refill??  Last office visit 09/16/2013.  Last refill 10/04/2013.

## 2013-11-01 NOTE — Telephone Encounter (Signed)
Rx printed and faxed to pharm 

## 2013-11-01 NOTE — Telephone Encounter (Signed)
Pharmacy opens at 9am

## 2013-11-03 ENCOUNTER — Ambulatory Visit (INDEPENDENT_AMBULATORY_CARE_PROVIDER_SITE_OTHER): Payer: Medicare Other | Admitting: Family Medicine

## 2013-11-03 ENCOUNTER — Encounter: Payer: Self-pay | Admitting: Family Medicine

## 2013-11-03 VITALS — BP 110/60 | HR 80 | Temp 97.2°F | Resp 20 | Ht 67.0 in | Wt 223.0 lb

## 2013-11-03 DIAGNOSIS — Z09 Encounter for follow-up examination after completed treatment for conditions other than malignant neoplasm: Secondary | ICD-10-CM

## 2013-11-03 DIAGNOSIS — R269 Unspecified abnormalities of gait and mobility: Secondary | ICD-10-CM | POA: Diagnosis not present

## 2013-11-03 DIAGNOSIS — K5289 Other specified noninfective gastroenteritis and colitis: Secondary | ICD-10-CM | POA: Diagnosis not present

## 2013-11-03 DIAGNOSIS — I10 Essential (primary) hypertension: Secondary | ICD-10-CM | POA: Diagnosis not present

## 2013-11-03 DIAGNOSIS — K529 Noninfective gastroenteritis and colitis, unspecified: Secondary | ICD-10-CM

## 2013-11-03 DIAGNOSIS — IMO0001 Reserved for inherently not codable concepts without codable children: Secondary | ICD-10-CM | POA: Diagnosis not present

## 2013-11-03 DIAGNOSIS — E119 Type 2 diabetes mellitus without complications: Secondary | ICD-10-CM | POA: Diagnosis not present

## 2013-11-03 DIAGNOSIS — M159 Polyosteoarthritis, unspecified: Secondary | ICD-10-CM | POA: Diagnosis not present

## 2013-11-03 NOTE — Progress Notes (Signed)
Subjective:    Patient ID: Bonnie Watkins, female    DOB: October 16, 1934, 79 y.o.   MRN: [id number]  HPI Patient went to the emergency room on June 17 after having severe diarrhea for 5 days. She is also having abdominal pain and emesis. I have copied relevant portions of her hospital notes and included them below for my reference: HPI Comments: Pt with hx of IDDM, Diverticular dz, cholecystectomy comes in to the ER with cc of abd pain. Abd pain since Saturday, constant, and described as burning pain in the epigastrium and sometimes laterally. + associated nausea and emesis - emesis x 3 all today, non bilious and non bloody. Pt also has had about 3 darkm tarry and green stools today, no bright red blood.  Pt denies antibiotic use. + mild dysuria, no hematuria or polyuria.  Patient is a 79 y.o. female presenting with vomiting and abdominal pain. The history is provided by the patient.  Emesis Associated symptoms: abdominal pain and diarrhea  Associated symptoms: no headaches  Abdominal Pain Associated symptoms: diarrhea, dysuria, nausea and vomiting  Associated symptoms: no chest pain, no constipation, no cough, no fever, no hematuria and no shortness of breath  Past Medical History   Diagnosis  Date   .  Diabetes mellitus without complication    .  Schizophrenia    .  Hypertension    .  Dementia    .  Depression    .  GERD (gastroesophageal reflux disease)    .  ACE inhibitor-aggravated angioedema    .  Diverticulosis    History reviewed. No pertinent past surgical history.  No family history on file.  History   Substance Use Topics   .  Smoking status:  Former Research scientist (life sciences)   .  Smokeless tobacco:  Not on file   .  Alcohol Use:  No     BP 178/85  Pulse 80  Temp(Src) 98.2 F (36.8 C) (Oral)  Resp 16  SpO2 95%  Physical Exam  Constitutional: She is oriented to person, place, and time. She appears well-developed and well-nourished.  HENT:  Head: Normocephalic and atraumatic.    Eyes: EOM are normal. Pupils are equal, round, and reactive to light.  Neck: Neck supple.  Cardiovascular: Normal rate, regular rhythm and normal heart sounds.  Pulmonary/Chest: Effort normal. No respiratory distress.  Abdominal: Soft. She exhibits no distension. There is tenderness. There is no rebound and no guarding.  Musculoskeletal: She exhibits no edema.  Neurological: She is alert and oriented to person, place, and time.  Skin: Skin is warm and dry.  ED Course   Procedures (including critical care time)  Labs Review  Labs Reviewed   CBC WITH DIFFERENTIAL - Abnormal; Notable for the following:    WBC  10.9 (*)     All other components within normal limits   COMPREHENSIVE METABOLIC PANEL - Abnormal; Notable for the following:    Sodium  127 (*)     Potassium  2.7 (*)     Chloride  81 (*)     Glucose, Bld  189 (*)     AST  69 (*)     GFR calc non Af Amer  87 (*)     All other components within normal limits   URINALYSIS, ROUTINE W REFLEX MICROSCOPIC - Abnormal; Notable for the following:    Ketones, ur  15 (*)     Leukocytes, UA  SMALL (*)     All other components within normal  limits   URINE MICROSCOPIC-ADD ON - Abnormal; Notable for the following:    Squamous Epithelial / LPF  FEW (*)     Bacteria, UA  FEW (*)     All other components within normal limits   LIPASE, BLOOD   MAGNESIUM   POTASSIUM   I-STAT TROPOININ, ED   Imaging Review  Ct Abdomen Pelvis W Contrast  10/26/2013 CLINICAL DATA: Abdominal pain and diarrhea. Nausea. EXAM: CT ABDOMEN AND PELVIS WITH CONTRAST TECHNIQUE: Multidetector CT imaging of the abdomen and pelvis was performed using the standard protocol following bolus administration of intravenous contrast. CONTRAST: 80 mL of Omnipaque 300 IV contrast COMPARISON: CT of the abdomen and pelvis from 12/22/2011 FINDINGS: The visualized lung bases are clear. A tiny hiatal hernia is noted. Mild prominence of the intrahepatic biliary ducts remains within normal  limits status post cholecystectomy. Clips are noted along the gallbladder fossa. The liver is otherwise unremarkable in appearance; a clip is noted along the hepatic dome. The spleen contains a calcified granuloma but is otherwise unremarkable. The pancreas and adrenal glands are unremarkable; there is diffuse fatty infiltration within the pancreas. The kidneys are unremarkable in appearance. There is no evidence of hydronephrosis. No renal or ureteral stones are seen. No perinephric stranding is appreciated. No free fluid is identified. The small bowel is unremarkable in appearance. The stomach is within normal limits. No acute vascular abnormalities are seen. There is mild calcification along the abdominal aorta and its branches. The appendix is not well characterized. There is no evidence for appendicitis. There is slight prominence of vasculature about the colon; the colon is grossly unremarkable in appearance. The bladder is moderately distended and grossly unremarkable in appearance. The patient is status post hysterectomy. No suspicious adnexal masses are seen. No inguinal lymphadenopathy is seen. No acute osseous abnormalities are identified. Vacuum phenomenon is noted at L3-L4 and L5-S1. There is mild chronic loss of height at L2. IMPRESSION: 1. No acute abnormality seen within the abdomen or pelvis. 2. Slight prominence of the vasculature about the colon may reflect chronic sequelae of inflammation, or perhaps a minimal infectious process. This is somewhat better characterized than on the prior study. 3. Tiny hiatal hernia seen. 4. Mild calcification along the abdominal aorta and its branches. Electronically Signed By: Garald Balding M.D. On: 10/26/2013 06:21  EKG Interpretation  Date/Time: Wednesday October 26 2013 01:07:26 EDT  Ventricular Rate: 82  PR Interval: 216  QRS Duration: 101  QT Interval: 438  QTC Calculation: 512  R Axis: 1  Text Interpretation: Sinus rhythm Borderline prolonged PR  interval  Abnormal R-wave progression, early transition Nonspecific T abnormalities,  diffuse leads Prolonged QT interval Confirmed by Kathrynn Humble, MD, ANKIT  [id number]) on 10/26/2013 1:17:37 AM  MDM    Final diagnoses:   Hypokalemia   Colitis   Hyponatremia  Pt comes in w/ cc of epigastric pain, emesis, diarrhea.  She has mild leukocytosis, and tenderness on exam.  CT scan ordered, and shows mild colitis like picture.  Patient has hyponatremia and hypokalemia.  Along with LR for fluids, patient received 40 meq x 2 of K orally.  Pt passed po challenge. IV AB in the ER given, and patient discharged with stable vitals.  Pt advised to see her PCP in 3-5 days.   Patient was diagnosed with colitis and hypokalemia and hyponatremia. She was given IV fluids and potassium in the emergency room. She was then discharged home on 7 days of Cipro and Flagyl for infectious colitis.  The patient states that since leaving the emergency room she has only required Imodium at one time.  The diarrhea has stopped. She has no further emesis.  She still complains of episodic nausea and. She is eating a bland diet because that's all she can tolerate. She denies any fever or melena or hematochezia.  Patient is a resident of a nursing facility although there is no known exposure to C. difficile colitis. She still feels extremely weak and dizzy and unsteady on her feet. Her blood pressure is much lower than usual at 110/60. Past Medical History  Diagnosis Date  . Diabetes mellitus without complication   . Schizophrenia   . Hypertension   . Dementia   . Depression   . GERD (gastroesophageal reflux disease)   . ACE inhibitor-aggravated angioedema   . Diverticulosis    No past surgical history on file. Current Outpatient Prescriptions on File Prior to Visit  Medication Sig Dispense Refill  . acetaminophen (TYLENOL) 500 MG tablet Take 500 mg by mouth every 6 (six) hours as needed for mild pain.      Marland Kitchen amLODipine (NORVASC)  10 MG tablet Take 10 mg by mouth daily.      Marland Kitchen aspirin EC 81 MG tablet Take 81 mg by mouth daily.      . benztropine (COGENTIN) 1 MG tablet Take 1 mg by mouth 2 (two) times daily.      . calcium carbonate (TUMS - DOSED IN MG ELEMENTAL CALCIUM) 500 MG chewable tablet Chew 1 tablet by mouth 3 (three) times daily as needed for indigestion or heartburn.      . Carboxymethylcellul-Glycerin (OPTIVE) 0.5-0.9 % SOLN Place 1 drop into both eyes 3 (three) times daily.      . citalopram (CELEXA) 20 MG tablet Take 20 mg by mouth daily.      Marland Kitchen docusate sodium (COLACE) 250 MG capsule 2 caps po bid  120 capsule  11  . hydrochlorothiazide (HYDRODIURIL) 25 MG tablet Take 25 mg by mouth daily.      . insulin glargine (LANTUS) 100 UNIT/ML injection Inject 12 Units into the skin every morning.      Marland Kitchen levothyroxine (SYNTHROID, LEVOTHROID) 50 MCG tablet Take 50 mcg by mouth daily before breakfast.      . loratadine (CLARITIN) 10 MG tablet Take 10 mg by mouth daily.      . memantine (NAMENDA) 10 MG tablet Take 1 tablet (10 mg total) by mouth 2 (two) times daily.  60 tablet  2  . metFORMIN (GLUCOPHAGE) 1000 MG tablet Take 1,000 mg by mouth 2 (two) times daily.      Marland Kitchen omeprazole (PRILOSEC) 40 MG capsule Take 40 mg by mouth daily.      . ondansetron (ZOFRAN ODT) 8 MG disintegrating tablet Take 1 tablet (8 mg total) by mouth every 8 (eight) hours as needed for nausea.  20 tablet  0  . perphenazine (TRILAFON) 4 MG tablet Take 6 mg by mouth every morning.       . polyethylene glycol (MIRALAX / GLYCOLAX) packet Take 17 g by mouth 2 (two) times daily as needed for mild constipation.      . QUEtiapine (SEROQUEL) 200 MG tablet Take 200 mg by mouth at bedtime.      . traMADol (ULTRAM) 50 MG tablet TAKE (1) TABLET BY MOUTH TWICE DAILY.  60 tablet  2  . traZODone (DESYREL) 150 MG tablet Take 150 mg by mouth at bedtime as needed for sleep.  No current facility-administered medications on file prior to visit.   Allergies    Allergen Reactions  . Ace Inhibitors Other (See Comments)    On MAR  . Angiotensin Receptor Blockers Other (See Comments)    ON MAR  . Lisinopril     Angioedema  . Robaxin [Methocarbamol] Rash   History   Social History  . Marital Status: Divorced    Spouse Name: N/A    Number of Children: N/A  . Years of Education: N/A   Occupational History  . Not on file.   Social History Main Topics  . Smoking status: Former Research scientist (life sciences)  . Smokeless tobacco: Not on file  . Alcohol Use: No  . Drug Use: No  . Sexual Activity: No     Comment: lives at [facility].     Other Topics Concern  . Not on file   Social History Narrative  . No narrative on file       Review of Systems  All other systems reviewed and are negative.      Objective:   Physical Exam  Vitals reviewed. Constitutional: She appears well-developed and well-nourished. No distress.  Eyes: Conjunctivae are normal. No scleral icterus.  Neck: Neck supple. No JVD present.  Cardiovascular: Normal rate, regular rhythm and normal heart sounds.   No murmur heard. Pulmonary/Chest: Effort normal and breath sounds normal. No respiratory distress. She has no wheezes. She has no rales.  Abdominal: Soft. Bowel sounds are normal. She exhibits no distension. There is no tenderness. There is no rebound and no guarding.  Musculoskeletal: She exhibits no edema.  Lymphadenopathy:    She has no cervical adenopathy.  Skin: She is not diaphoretic.          Assessment & Plan:  Colitis - Plan: CBC with Differential, COMPLETE METABOLIC PANEL WITH GFR, Clostridium Difficile by [facility] discharge follow-up   Clinically the patient appears to be improving.  Check a CBC and a CMP today. I'll make sure that her hyponatremia and hypokalemia has resolved. I believe this is most likely due to her diarrhea and dehydration.  I'll also send a stool assay to rule out C. difficile colitis.  If lab work is normal and stool assay is  negative and patient continues to improve clinically I feel no further followup is necessary. The weakness is due to dehydration and relative hypotension. Differential lung going to temporarily discontinue amlodipine recheck blood pressure here at an office visit in one week. I also ordered physical therapy at her nursing home to help improve weakness and her balance.

## 2013-11-04 DIAGNOSIS — K5289 Other specified noninfective gastroenteritis and colitis: Secondary | ICD-10-CM | POA: Diagnosis not present

## 2013-11-04 LAB — CBC WITH DIFFERENTIAL/PLATELET
Basophils Absolute: 0.1 10*3/uL (ref 0.0–0.1)
Basophils Relative: 1 % (ref 0–1)
Eosinophils Absolute: 0.2 10*3/uL (ref 0.0–0.7)
Eosinophils Relative: 2 % (ref 0–5)
HEMATOCRIT: 37.8 % (ref 36.0–46.0)
HEMOGLOBIN: 13 g/dL (ref 12.0–15.0)
Lymphocytes Relative: 33 % (ref 12–46)
Lymphs Abs: 2.6 10*3/uL (ref 0.7–4.0)
MCH: 30.6 pg (ref 26.0–34.0)
MCHC: 34.4 g/dL (ref 30.0–36.0)
MCV: 88.9 fL (ref 78.0–100.0)
MONO ABS: 0.7 10*3/uL (ref 0.1–1.0)
MONOS PCT: 9 % (ref 3–12)
NEUTROS ABS: 4.4 10*3/uL (ref 1.7–7.7)
Neutrophils Relative %: 55 % (ref 43–77)
Platelets: 247 10*3/uL (ref 150–400)
RBC: 4.25 MIL/uL (ref 3.87–5.11)
RDW: 13.9 % (ref 11.5–15.5)
WBC: 8 10*3/uL (ref 4.0–10.5)

## 2013-11-04 LAB — COMPLETE METABOLIC PANEL WITH GFR
ALBUMIN: 4.1 g/dL (ref 3.5–5.2)
ALT: 19 U/L (ref 0–35)
AST: 21 U/L (ref 0–37)
Alkaline Phosphatase: 60 U/L (ref 39–117)
BUN: 6 mg/dL (ref 6–23)
CALCIUM: 8.9 mg/dL (ref 8.4–10.5)
CO2: 26 mEq/L (ref 19–32)
Chloride: 89 mEq/L — ABNORMAL LOW (ref 96–112)
Creat: 0.75 mg/dL (ref 0.50–1.10)
GFR, EST AFRICAN AMERICAN: 89 mL/min
GFR, EST NON AFRICAN AMERICAN: 77 mL/min
GLUCOSE: 166 mg/dL — AB (ref 70–99)
POTASSIUM: 4.5 meq/L (ref 3.5–5.3)
SODIUM: 129 meq/L — AB (ref 135–145)
Total Bilirubin: 0.3 mg/dL (ref 0.2–1.2)
Total Protein: 7.2 g/dL (ref 6.0–8.3)

## 2013-11-04 LAB — CLOSTRIDIUM DIFFICILE BY PCR: Toxigenic C. Difficile by PCR: NEGATIVE

## 2013-11-07 DIAGNOSIS — E119 Type 2 diabetes mellitus without complications: Secondary | ICD-10-CM | POA: Diagnosis not present

## 2013-11-07 DIAGNOSIS — I10 Essential (primary) hypertension: Secondary | ICD-10-CM | POA: Diagnosis not present

## 2013-11-07 DIAGNOSIS — R269 Unspecified abnormalities of gait and mobility: Secondary | ICD-10-CM | POA: Diagnosis not present

## 2013-11-07 DIAGNOSIS — M159 Polyosteoarthritis, unspecified: Secondary | ICD-10-CM | POA: Diagnosis not present

## 2013-11-07 DIAGNOSIS — IMO0001 Reserved for inherently not codable concepts without codable children: Secondary | ICD-10-CM | POA: Diagnosis not present

## 2013-11-10 ENCOUNTER — Encounter: Payer: Self-pay | Admitting: Family Medicine

## 2013-11-10 ENCOUNTER — Ambulatory Visit (INDEPENDENT_AMBULATORY_CARE_PROVIDER_SITE_OTHER): Payer: Medicare Other | Admitting: Family Medicine

## 2013-11-10 VITALS — BP 128/70 | HR 80 | Temp 97.6°F | Resp 18 | Ht 67.0 in | Wt 228.0 lb

## 2013-11-10 DIAGNOSIS — R0989 Other specified symptoms and signs involving the circulatory and respiratory systems: Secondary | ICD-10-CM

## 2013-11-10 DIAGNOSIS — R0609 Other forms of dyspnea: Secondary | ICD-10-CM | POA: Diagnosis not present

## 2013-11-10 DIAGNOSIS — E871 Hypo-osmolality and hyponatremia: Secondary | ICD-10-CM

## 2013-11-10 DIAGNOSIS — K529 Noninfective gastroenteritis and colitis, unspecified: Secondary | ICD-10-CM

## 2013-11-10 DIAGNOSIS — K5289 Other specified noninfective gastroenteritis and colitis: Secondary | ICD-10-CM | POA: Diagnosis not present

## 2013-11-10 LAB — BASIC METABOLIC PANEL
BUN: 10 mg/dL (ref 6–23)
CHLORIDE: 89 meq/L — AB (ref 96–112)
CO2: 23 mEq/L (ref 19–32)
Calcium: 8.7 mg/dL (ref 8.4–10.5)
Creat: 0.63 mg/dL (ref 0.50–1.10)
Glucose, Bld: 111 mg/dL — ABNORMAL HIGH (ref 70–99)
Potassium: 4.3 mEq/L (ref 3.5–5.3)
Sodium: 127 mEq/L — ABNORMAL LOW (ref 135–145)

## 2013-11-10 NOTE — Progress Notes (Signed)
Subjective:    Patient ID: Bonnie Watkins, female    DOB: Nov 10, 1934, 79 y.o.   MRN: [id number]  HPI 11/03/13 Patient went to the emergency room on June 17 after having severe diarrhea for 5 days. She is also having abdominal pain and emesis. I have copied relevant portions of her hospital notes and included them below for my reference: HPI Comments: Pt with hx of IDDM, Diverticular dz, cholecystectomy comes in to the ER with cc of abd pain. Abd pain since Saturday, constant, and described as burning pain in the epigastrium and sometimes laterally. + associated nausea and emesis - emesis x 3 all today, non bilious and non bloody. Pt also has had about 3 darkm tarry and green stools today, no bright red blood.  Pt denies antibiotic use. + mild dysuria, no hematuria or polyuria.  Patient is a 79 y.o. female presenting with vomiting and abdominal pain. The history is provided by the patient.  Emesis Associated symptoms: abdominal pain and diarrhea  Associated symptoms: no headaches  Abdominal Pain Associated symptoms: diarrhea, dysuria, nausea and vomiting  Associated symptoms: no chest pain, no constipation, no cough, no fever, no hematuria and no shortness of breath  Past Medical History   Diagnosis  Date   .  Diabetes mellitus without complication    .  Schizophrenia    .  Hypertension    .  Dementia    .  Depression    .  GERD (gastroesophageal reflux disease)    .  ACE inhibitor-aggravated angioedema    .  Diverticulosis    History reviewed. No pertinent past surgical history.  No family history on file.  History   Substance Use Topics   .  Smoking status:  Former Research scientist (life sciences)   .  Smokeless tobacco:  Not on file   .  Alcohol Use:  No     BP 178/85  Pulse 80  Temp(Src) 98.2 F (36.8 C) (Oral)  Resp 16  SpO2 95%  Physical Exam  Constitutional: She is oriented to person, place, and time. She appears well-developed and well-nourished.  HENT:  Head: Normocephalic and  atraumatic.  Eyes: EOM are normal. Pupils are equal, round, and reactive to light.  Neck: Neck supple.  Cardiovascular: Normal rate, regular rhythm and normal heart sounds.  Pulmonary/Chest: Effort normal. No respiratory distress.  Abdominal: Soft. She exhibits no distension. There is tenderness. There is no rebound and no guarding.  Musculoskeletal: She exhibits no edema.  Neurological: She is alert and oriented to person, place, and time.  Skin: Skin is warm and dry.  ED Course   Procedures (including critical care time)  Labs Review  Labs Reviewed   CBC WITH DIFFERENTIAL - Abnormal; Notable for the following:    WBC  10.9 (*)     All other components within normal limits   COMPREHENSIVE METABOLIC PANEL - Abnormal; Notable for the following:    Sodium  127 (*)     Potassium  2.7 (*)     Chloride  81 (*)     Glucose, Bld  189 (*)     AST  69 (*)     GFR calc non Af Amer  87 (*)     All other components within normal limits   URINALYSIS, ROUTINE W REFLEX MICROSCOPIC - Abnormal; Notable for the following:    Ketones, ur  15 (*)     Leukocytes, UA  SMALL (*)     All other components within normal  limits   URINE MICROSCOPIC-ADD ON - Abnormal; Notable for the following:    Squamous Epithelial / LPF  FEW (*)     Bacteria, UA  FEW (*)     All other components within normal limits   LIPASE, BLOOD   MAGNESIUM   POTASSIUM   I-STAT TROPOININ, ED   Imaging Review  Ct Abdomen Pelvis W Contrast  10/26/2013 CLINICAL DATA: Abdominal pain and diarrhea. Nausea. EXAM: CT ABDOMEN AND PELVIS WITH CONTRAST TECHNIQUE: Multidetector CT imaging of the abdomen and pelvis was performed using the standard protocol following bolus administration of intravenous contrast. CONTRAST: 80 mL of Omnipaque 300 IV contrast COMPARISON: CT of the abdomen and pelvis from 12/22/2011 FINDINGS: The visualized lung bases are clear. A tiny hiatal hernia is noted. Mild prominence of the intrahepatic biliary ducts remains  within normal limits status post cholecystectomy. Clips are noted along the gallbladder fossa. The liver is otherwise unremarkable in appearance; a clip is noted along the hepatic dome. The spleen contains a calcified granuloma but is otherwise unremarkable. The pancreas and adrenal glands are unremarkable; there is diffuse fatty infiltration within the pancreas. The kidneys are unremarkable in appearance. There is no evidence of hydronephrosis. No renal or ureteral stones are seen. No perinephric stranding is appreciated. No free fluid is identified. The small bowel is unremarkable in appearance. The stomach is within normal limits. No acute vascular abnormalities are seen. There is mild calcification along the abdominal aorta and its branches. The appendix is not well characterized. There is no evidence for appendicitis. There is slight prominence of vasculature about the colon; the colon is grossly unremarkable in appearance. The bladder is moderately distended and grossly unremarkable in appearance. The patient is status post hysterectomy. No suspicious adnexal masses are seen. No inguinal lymphadenopathy is seen. No acute osseous abnormalities are identified. Vacuum phenomenon is noted at L3-L4 and L5-S1. There is mild chronic loss of height at L2. IMPRESSION: 1. No acute abnormality seen within the abdomen or pelvis. 2. Slight prominence of the vasculature about the colon may reflect chronic sequelae of inflammation, or perhaps a minimal infectious process. This is somewhat better characterized than on the prior study. 3. Tiny hiatal hernia seen. 4. Mild calcification along the abdominal aorta and its branches. Electronically Signed By: Garald Balding M.D. On: 10/26/2013 06:21  EKG Interpretation  Date/Time: Wednesday October 26 2013 01:07:26 EDT  Ventricular Rate: 82  PR Interval: 216  QRS Duration: 101  QT Interval: 438  QTC Calculation: 512  R Axis: 1  Text Interpretation: Sinus rhythm Borderline  prolonged PR interval  Abnormal R-wave progression, early transition Nonspecific T abnormalities,  diffuse leads Prolonged QT interval Confirmed by Kathrynn Humble, MD, ANKIT  [phone]) on 10/26/2013 1:17:37 AM  MDM    Final diagnoses:   Hypokalemia   Colitis   Hyponatremia  Pt comes in w/ cc of epigastric pain, emesis, diarrhea.  She has mild leukocytosis, and tenderness on exam.  CT scan ordered, and shows mild colitis like picture.  Patient has hyponatremia and hypokalemia.  Along with LR for fluids, patient received 40 meq x 2 of K orally.  Pt passed po challenge. IV AB in the ER given, and patient discharged with stable vitals.  Pt advised to see her PCP in 3-5 days.   Patient was diagnosed with colitis and hypokalemia and hyponatremia. She was given IV fluids and potassium in the emergency room. She was then discharged home on 7 days of Cipro and Flagyl for infectious colitis.  The patient states that since leaving the emergency room she has only required Imodium at one time.  The diarrhea has stopped. She has no further emesis.  She still complains of episodic nausea and. She is eating a bland diet because that's all she can tolerate. She denies any fever or melena or hematochezia.  Patient is a resident of a nursing facility although there is no known exposure to C. difficile colitis. She still feels extremely weak and dizzy and unsteady on her feet. Her blood pressure is much lower than usual at 110/60.  At that time, my plan was: Clinically the patient appears to be improving.  Check a CBC and a CMP today. I'll make sure that her hyponatremia and hypokalemia has resolved. I believe this is most likely due to her diarrhea and dehydration.  I'll also send a stool assay to rule out C. difficile colitis.  If lab work is normal and stool assay is negative and patient continues to improve clinically I feel no further followup is necessary. The weakness is due to dehydration and relative hypotension. Due  to low blood pressure, I will temporarily discontinue amlodipine recheck blood pressure here at an office visit in one week. I also ordered physical therapy at her nursing home to help improve weakness and her balance.  11/10/13 Patient is here today for recheck.  Her potassium was normal on her lab work.  However, her sodium remained low.   No visits with results within 1 Week(s) from this visit. Latest known visit with results is:  Office Visit on 11/03/2013  Component Date Value Ref Range Status  . WBC 11/03/2013 8.0  4.0 - 10.5 K/uL Final  . RBC 11/03/2013 4.25  3.87 - 5.11 MIL/uL Final  . Hemoglobin 11/03/2013 13.0  12.0 - 15.0 g/dL Final  . HCT 11/03/2013 37.8  36.0 - 46.0 % Final  . MCV 11/03/2013 88.9  78.0 - 100.0 fL Final  . MCH 11/03/2013 30.6  26.0 - 34.0 pg Final  . MCHC 11/03/2013 34.4  30.0 - 36.0 g/dL Final  . RDW 11/03/2013 13.9  11.5 - 15.5 % Final  . Platelets 11/03/2013 247  150 - 400 K/uL Final  . Neutrophils Relative % 11/03/2013 55  43 - 77 % Final  . Neutro Abs 11/03/2013 4.4  1.7 - 7.7 K/uL Final  . Lymphocytes Relative 11/03/2013 33  12 - 46 % Final  . Lymphs Abs 11/03/2013 2.6  0.7 - 4.0 K/uL Final  . Monocytes Relative 11/03/2013 9  3 - 12 % Final  . Monocytes Absolute 11/03/2013 0.7  0.1 - 1.0 K/uL Final  . Eosinophils Relative 11/03/2013 2  0 - 5 % Final  . Eosinophils Absolute 11/03/2013 0.2  0.0 - 0.7 K/uL Final  . Basophils Relative 11/03/2013 1  0 - 1 % Final  . Basophils Absolute 11/03/2013 0.1  0.0 - 0.1 K/uL Final  . Smear Review 11/03/2013 Criteria for review not met   Final  . Sodium 11/03/2013 129* 135 - 145 mEq/L Final  . Potassium 11/03/2013 4.5  3.5 - 5.3 mEq/L Final  . Chloride 11/03/2013 89* 96 - 112 mEq/L Final  . CO2 11/03/2013 26  19 - 32 mEq/L Final  . Glucose, Bld 11/03/2013 166* 70 - 99 mg/dL Final  . BUN 11/03/2013 6  6 - 23 mg/dL Final  . Creat 11/03/2013 0.75  0.50 - 1.10 mg/dL Final  . Total Bilirubin 11/03/2013 0.3  0.2 - 1.2  mg/dL Final  .  Alkaline Phosphatase 11/03/2013 60  39 - 117 U/L Final  . AST 11/03/2013 21  0 - 37 U/L Final  . ALT 11/03/2013 19  0 - 35 U/L Final  . Total Protein 11/03/2013 7.2  6.0 - 8.3 g/dL Final  . Albumin 11/03/2013 4.1  3.5 - 5.2 g/dL Final  . Calcium 11/03/2013 8.9  8.4 - 10.5 mg/dL Final  . GFR, Est African American 11/03/2013 89   Final  . GFR, Est Non African American 11/03/2013 77   Final   Comment:                            The estimated GFR is a calculation valid for adults (>=57 years old)                          that uses the CKD-EPI algorithm to adjust for age and sex. It is                            not to be used for children, pregnant women, hospitalized patients,                             patients on dialysis, or with rapidly changing kidney function.                          According to the NKDEP, eGFR >89 is normal, 60-89 shows mild                          impairment, 30-59 shows moderate impairment, 15-29 shows severe                          impairment and <15 is ESRD.                              Patient's diarrhea has resolved. Her abdominal pain is much better. Unfortunately she reports substernal chest pressure at times. She also reports dyspnea on exertion and weakness. She denies any chest pain. This has gradually worsened over the last week. She is now short of breath with minimal exertion.  Past Medical History  Diagnosis Date  . Diabetes mellitus without complication   . Schizophrenia   . Hypertension   . Dementia   . Depression   . GERD (gastroesophageal reflux disease)   . ACE inhibitor-aggravated angioedema   . Diverticulosis    No past surgical history on file. Current Outpatient Prescriptions on File Prior to Visit  Medication Sig Dispense Refill  . acetaminophen (TYLENOL) 500 MG tablet Take 500 mg by mouth every 6 (six) hours as needed for mild pain.      Marland Kitchen amLODipine (NORVASC) 10 MG tablet Take 10 mg by mouth daily.      Marland Kitchen aspirin  EC 81 MG tablet Take 81 mg by mouth daily.      . benztropine (COGENTIN) 1 MG tablet Take 1 mg by mouth 2 (two) times daily.      . calcium carbonate (TUMS - DOSED IN MG ELEMENTAL CALCIUM) 500 MG chewable tablet Chew 1 tablet by mouth 3 (three) times daily as needed for indigestion or heartburn.      Marland Kitchen  Carboxymethylcellul-Glycerin (OPTIVE) 0.5-0.9 % SOLN Place 1 drop into both eyes 3 (three) times daily.      . citalopram (CELEXA) 20 MG tablet Take 20 mg by mouth daily.      Marland Kitchen docusate sodium (COLACE) 250 MG capsule 2 caps po bid  120 capsule  11  . hydrochlorothiazide (HYDRODIURIL) 25 MG tablet Take 25 mg by mouth daily.      . insulin glargine (LANTUS) 100 UNIT/ML injection Inject 12 Units into the skin every morning.      Marland Kitchen levothyroxine (SYNTHROID, LEVOTHROID) 50 MCG tablet Take 50 mcg by mouth daily before breakfast.      . loratadine (CLARITIN) 10 MG tablet Take 10 mg by mouth daily.      . memantine (NAMENDA) 10 MG tablet Take 1 tablet (10 mg total) by mouth 2 (two) times daily.  60 tablet  2  . metFORMIN (GLUCOPHAGE) 1000 MG tablet Take 1,000 mg by mouth 2 (two) times daily.      Marland Kitchen omeprazole (PRILOSEC) 40 MG capsule Take 40 mg by mouth daily.      . ondansetron (ZOFRAN ODT) 8 MG disintegrating tablet Take 1 tablet (8 mg total) by mouth every 8 (eight) hours as needed for nausea.  20 tablet  0  . perphenazine (TRILAFON) 4 MG tablet Take 6 mg by mouth every morning.       . polyethylene glycol (MIRALAX / GLYCOLAX) packet Take 17 g by mouth 2 (two) times daily as needed for mild constipation.      . QUEtiapine (SEROQUEL) 200 MG tablet Take 200 mg by mouth at bedtime.      . traMADol (ULTRAM) 50 MG tablet TAKE (1) TABLET BY MOUTH TWICE DAILY.  60 tablet  2  . traZODone (DESYREL) 150 MG tablet Take 150 mg by mouth at bedtime as needed for sleep.        No current facility-administered medications on file prior to visit.   Allergies  Allergen Reactions  . Ace Inhibitors Other (See  Comments)    On MAR  . Angiotensin Receptor Blockers Other (See Comments)    ON MAR  . Lisinopril     Angioedema  . Robaxin [Methocarbamol] Rash   History   Social History  . Marital Status: Divorced    Spouse Name: N/A    Number of Children: N/A  . Years of Education: N/A   Occupational History  . Not on file.   Social History Main Topics  . Smoking status: Former Research scientist (life sciences)  . Smokeless tobacco: Not on file  . Alcohol Use: No  . Drug Use: No  . Sexual Activity: No     Comment: lives at [facility].     Other Topics Concern  . Not on file   Social History Narrative  . No narrative on file       Review of Systems  All other systems reviewed and are negative.      Objective:   Physical Exam  Vitals reviewed. Constitutional: She appears well-developed and well-nourished. No distress.  Eyes: Conjunctivae are normal. No scleral icterus.  Neck: Neck supple. No JVD present.  Cardiovascular: Normal rate, regular rhythm and normal heart sounds.   No murmur heard. Pulmonary/Chest: Effort normal and breath sounds normal. No respiratory distress. She has no wheezes. She has no rales.  Abdominal: Soft. Bowel sounds are normal. She exhibits no distension. There is no tenderness. There is no rebound and no guarding.  Musculoskeletal: She exhibits no edema.  Lymphadenopathy:    She has no cervical adenopathy.  Skin: She is not diaphoretic.          Assessment & Plan:  1. Colitis Clinically, the colitis seems to be resolving.  I have not yet to seen the results on her C. difficile assay.  However clinically the patient does not have C. difficile colitis.  2. Hyponatremia I will repeat a BMP today to make sure the hyponatremia has improved with hydration. - Basic Metabolic Panel  3. Dyspnea on exertion I obtained an EKG.  EKG shows normal sinus rhythm with first degree AV block at 67 beats per minute. The patient has a normal axis. She does have nonspecific ST  changes in inferior and lateral leads. I will obtain a cardiology consultation rule out ischemic cardiomyopathy as  the cause for dyspnea on exertion given her numerous risk factors. - EKG 12-Lead - Ambulatory referral to Cardiology

## 2013-11-16 ENCOUNTER — Other Ambulatory Visit: Payer: Self-pay | Admitting: Family Medicine

## 2013-11-16 ENCOUNTER — Telehealth: Payer: Self-pay | Admitting: Family Medicine

## 2013-11-16 DIAGNOSIS — E871 Hypo-osmolality and hyponatremia: Secondary | ICD-10-CM

## 2013-11-16 DIAGNOSIS — M159 Polyosteoarthritis, unspecified: Secondary | ICD-10-CM | POA: Diagnosis not present

## 2013-11-16 DIAGNOSIS — I10 Essential (primary) hypertension: Secondary | ICD-10-CM | POA: Diagnosis not present

## 2013-11-16 DIAGNOSIS — IMO0001 Reserved for inherently not codable concepts without codable children: Secondary | ICD-10-CM | POA: Diagnosis not present

## 2013-11-16 DIAGNOSIS — R269 Unspecified abnormalities of gait and mobility: Secondary | ICD-10-CM | POA: Diagnosis not present

## 2013-11-16 DIAGNOSIS — E119 Type 2 diabetes mellitus without complications: Secondary | ICD-10-CM | POA: Diagnosis not present

## 2013-11-16 NOTE — Telephone Encounter (Signed)
Message copied by Alyson Locket on Wed Nov 16, 2013  3:38 PM ------      Message from: Lenore Manner      Created: Wed Nov 16, 2013  3:16 PM      Regarding: [facility]       Contact: [phone]       [facility] is calling because of the food restrictions that they received they are not able to do them.            Please ask for PJ ------

## 2013-11-16 NOTE — Telephone Encounter (Signed)
LMTRC

## 2013-11-18 DIAGNOSIS — E119 Type 2 diabetes mellitus without complications: Secondary | ICD-10-CM | POA: Diagnosis not present

## 2013-11-18 DIAGNOSIS — M159 Polyosteoarthritis, unspecified: Secondary | ICD-10-CM | POA: Diagnosis not present

## 2013-11-18 DIAGNOSIS — R269 Unspecified abnormalities of gait and mobility: Secondary | ICD-10-CM | POA: Diagnosis not present

## 2013-11-18 DIAGNOSIS — IMO0001 Reserved for inherently not codable concepts without codable children: Secondary | ICD-10-CM | POA: Diagnosis not present

## 2013-11-18 DIAGNOSIS — I10 Essential (primary) hypertension: Secondary | ICD-10-CM | POA: Diagnosis not present

## 2013-11-21 DIAGNOSIS — I10 Essential (primary) hypertension: Secondary | ICD-10-CM | POA: Diagnosis not present

## 2013-11-21 DIAGNOSIS — R269 Unspecified abnormalities of gait and mobility: Secondary | ICD-10-CM | POA: Diagnosis not present

## 2013-11-21 DIAGNOSIS — M159 Polyosteoarthritis, unspecified: Secondary | ICD-10-CM | POA: Diagnosis not present

## 2013-11-21 DIAGNOSIS — IMO0001 Reserved for inherently not codable concepts without codable children: Secondary | ICD-10-CM | POA: Diagnosis not present

## 2013-11-21 DIAGNOSIS — E119 Type 2 diabetes mellitus without complications: Secondary | ICD-10-CM | POA: Diagnosis not present

## 2013-11-23 ENCOUNTER — Ambulatory Visit (INDEPENDENT_AMBULATORY_CARE_PROVIDER_SITE_OTHER): Payer: Medicare Other | Admitting: Cardiology

## 2013-11-23 ENCOUNTER — Encounter: Payer: Self-pay | Admitting: Cardiology

## 2013-11-23 VITALS — BP 132/82 | HR 68 | Ht 67.0 in | Wt 232.0 lb

## 2013-11-23 DIAGNOSIS — R0602 Shortness of breath: Secondary | ICD-10-CM | POA: Diagnosis not present

## 2013-11-23 DIAGNOSIS — E119 Type 2 diabetes mellitus without complications: Secondary | ICD-10-CM

## 2013-11-23 DIAGNOSIS — R9431 Abnormal electrocardiogram [ECG] [EKG]: Secondary | ICD-10-CM | POA: Diagnosis not present

## 2013-11-23 DIAGNOSIS — E669 Obesity, unspecified: Secondary | ICD-10-CM

## 2013-11-23 DIAGNOSIS — I10 Essential (primary) hypertension: Secondary | ICD-10-CM

## 2013-11-23 DIAGNOSIS — I440 Atrioventricular block, first degree: Secondary | ICD-10-CM | POA: Diagnosis not present

## 2013-11-23 DIAGNOSIS — E871 Hypo-osmolality and hyponatremia: Secondary | ICD-10-CM

## 2013-11-23 NOTE — Patient Instructions (Signed)
Your physician has requested that you have an echocardiogram. Echocardiography is a painless test that uses sound waves to create images of your heart. It provides your doctor with information about the size and shape of your heart and how well your heart's chambers and valves are working. This procedure takes approximately one hour. There are no restrictions for this procedure.  Your physician recommends that you schedule a follow-up appointment as needed.

## 2013-11-23 NOTE — Progress Notes (Signed)
[facility]. [address] [place], [place]  [address] Phone: [phone] Fax:  [phone]  Date:  11/23/2013   ID:  Bonnie Watkins, DOB [birth year], MRN [id number]  PCP:  Odette Fraction, MD   History of Present Illness: Bonnie Watkins is a 79 y.o. female here for the evaluation of dyspnea on exertion. I have reviewed Dr. Samella Parr note and EKG was obtained which showed first degree AV block, 67 beats per minute with nonspecific ST-T wave changes. There is concern for possible ischemic cardiomyopathy as the cause for her dyspnea on exertion given numerous risk factors.   She has had some shortness of breath that has improved. Deconditioning. No chest pain complaints.  She recently was quite ill going to the emergency room with diarrhea for 5 days. Abdominal pain, emesis. She has comorbidities of diabetes, schizophrenia, hypertension. Since that emergency room visit, she has improved.   Wt Readings from Last 3 Encounters:  11/23/13 232 lb (105.235 kg)  11/10/13 228 lb (103.42 kg)  11/03/13 223 lb (101.152 kg)     Past Medical History  Diagnosis Date  . Diabetes mellitus without complication   . Schizophrenia   . Hypertension   . Dementia   . Depression   . GERD (gastroesophageal reflux disease)   . ACE inhibitor-aggravated angioedema   . Diverticulosis     No past surgical history on file.  Current Outpatient Prescriptions  Medication Sig Dispense Refill  . acetaminophen (TYLENOL) 500 MG tablet Take 500 mg by mouth every 6 (six) hours as needed for mild pain.      Marland Kitchen amLODipine (NORVASC) 10 MG tablet Take 10 mg by mouth daily.      Marland Kitchen aspirin EC 81 MG tablet Take 81 mg by mouth daily.      . benztropine (COGENTIN) 1 MG tablet Take 1 mg by mouth 2 (two) times daily.      . calcium carbonate (TUMS - DOSED IN MG ELEMENTAL CALCIUM) 500 MG chewable tablet Chew 1 tablet by mouth 3 (three) times daily as needed for indigestion or heartburn.      .  Carboxymethylcellul-Glycerin (OPTIVE) 0.5-0.9 % SOLN Place 1 drop into both eyes 3 (three) times daily.      . citalopram (CELEXA) 20 MG tablet Take 20 mg by mouth daily.      Marland Kitchen docusate sodium (COLACE) 250 MG capsule 2 caps po bid  120 capsule  11  . hydrochlorothiazide (HYDRODIURIL) 25 MG tablet Take 25 mg by mouth daily.      . insulin glargine (LANTUS) 100 UNIT/ML injection Inject 12 Units into the skin every morning.      Marland Kitchen levothyroxine (SYNTHROID, LEVOTHROID) 50 MCG tablet Take 50 mcg by mouth daily before breakfast.      . loratadine (CLARITIN) 10 MG tablet Take 10 mg by mouth daily.      . memantine (NAMENDA) 10 MG tablet Take 1 tablet (10 mg total) by mouth 2 (two) times daily.  60 tablet  2  . metFORMIN (GLUCOPHAGE) 1000 MG tablet Take 1,000 mg by mouth 2 (two) times daily.      Marland Kitchen omeprazole (PRILOSEC) 40 MG capsule Take 40 mg by mouth daily.      . ondansetron (ZOFRAN ODT) 8 MG disintegrating tablet Take 1 tablet (8 mg total) by mouth every 8 (eight) hours as needed for nausea.  20 tablet  0  . perphenazine (TRILAFON) 4 MG tablet Take 6 mg by mouth every morning.       Marland Kitchen  polyethylene glycol (MIRALAX / GLYCOLAX) packet Take 17 g by mouth 2 (two) times daily as needed for mild constipation.      . traMADol (ULTRAM) 50 MG tablet TAKE (1) TABLET BY MOUTH TWICE DAILY.  60 tablet  2  . traZODone (DESYREL) 150 MG tablet Take 150 mg by mouth at bedtime as needed for sleep.       Marland Kitchen QUEtiapine (SEROQUEL) 200 MG tablet Take 200 mg by mouth at bedtime.       No current facility-administered medications for this visit.    Allergies:    Allergies  Allergen Reactions  . Shellfish Allergy Anaphylaxis    ALL SEAFOOD ALLERGY  . Ace Inhibitors Other (See Comments)    On MAR  . Angiotensin Receptor Blockers Other (See Comments)    ON MAR  . Lisinopril     Angioedema  . Robaxin [Methocarbamol] Rash    Social History:  The patient  reports that she has quit smoking. She does not have any  smokeless tobacco history on file. She reports that she does not drink alcohol or use illicit drugs.   No family history on file.  ROS:  Please see the history of present illness.   Denies any fevers, chills, orthopnea, PND, bleeding, syncope, chest pain.   All other systems reviewed and negative.   PHYSICAL EXAM: VS:  BP 132/82  Pulse 68  Ht 5\' 7"  (1.702 m)  Wt 232 lb (105.235 kg)  BMI 36.33 kg/m2 Well nourished, well developed, in no acute distress HEENT: normal, [place]/AT, EOMI Neck: no JVD, normal carotid upstroke, no bruit Cardiac:  normal S1, S2; RRR; no murmur Lungs:  clear to auscultation bilaterally, no wheezing, rhonchi or rales Abd: soft, nontender, no hepatomegaly, no bruitsoverweight Ext: no edema, 2+ distal pulses Skin: warm and dry GU: deferred Neuro: no focal abnormalities noted, AAO x 3  EKG:  Sinus rhythm , first degree AV block, nonspecific ST-T wave changes with inversion noted in the inferior and precordial lateral leads.     Labs: Sodium 127, potassium 4.3, creatinine 0.6, hemoglobin 13  ASSESSMENT AND PLAN:  1. Dyspnea-with associated abnormal EKG/nonspecific ST-T wave changes and her multiple comorbidities, I will go ahead and perform an echocardiogram to ensure proper structure and function of her heart. Overall she is improved after the last visit with Dr. Dennard Schaumann. I will followup with echocardiogram and if necessary see her back in the office. In the meanwhile, continue to modify all risk factors such as diabetes, hypertension, hyperlipidemia. Weight loss is important. 2. Essential hypertension-currently under good control. Multidrug regimen. Not on ACE inhibitor because of allergy. 3. Obesity-continue to encourage weight loss. 4. First degree AV block-try to avoid excessive AV nodal blocking agents. She is not having any high-risk symptoms such as syncope. 5. Hyponatremia-fluid restriction. Per primary team.  Signed, Candee Furbish, MD [facility]  11/23/2013 2:57 PM

## 2013-11-25 DIAGNOSIS — IMO0001 Reserved for inherently not codable concepts without codable children: Secondary | ICD-10-CM | POA: Diagnosis not present

## 2013-11-25 DIAGNOSIS — R269 Unspecified abnormalities of gait and mobility: Secondary | ICD-10-CM | POA: Diagnosis not present

## 2013-11-25 DIAGNOSIS — E119 Type 2 diabetes mellitus without complications: Secondary | ICD-10-CM | POA: Diagnosis not present

## 2013-11-25 DIAGNOSIS — M159 Polyosteoarthritis, unspecified: Secondary | ICD-10-CM | POA: Diagnosis not present

## 2013-11-25 DIAGNOSIS — I10 Essential (primary) hypertension: Secondary | ICD-10-CM | POA: Diagnosis not present

## 2013-11-30 DIAGNOSIS — E119 Type 2 diabetes mellitus without complications: Secondary | ICD-10-CM | POA: Diagnosis not present

## 2013-11-30 DIAGNOSIS — M159 Polyosteoarthritis, unspecified: Secondary | ICD-10-CM | POA: Diagnosis not present

## 2013-11-30 DIAGNOSIS — I10 Essential (primary) hypertension: Secondary | ICD-10-CM | POA: Diagnosis not present

## 2013-11-30 DIAGNOSIS — IMO0001 Reserved for inherently not codable concepts without codable children: Secondary | ICD-10-CM | POA: Diagnosis not present

## 2013-11-30 DIAGNOSIS — R269 Unspecified abnormalities of gait and mobility: Secondary | ICD-10-CM | POA: Diagnosis not present

## 2013-12-02 DIAGNOSIS — I10 Essential (primary) hypertension: Secondary | ICD-10-CM | POA: Diagnosis not present

## 2013-12-02 DIAGNOSIS — E119 Type 2 diabetes mellitus without complications: Secondary | ICD-10-CM | POA: Diagnosis not present

## 2013-12-02 DIAGNOSIS — IMO0001 Reserved for inherently not codable concepts without codable children: Secondary | ICD-10-CM | POA: Diagnosis not present

## 2013-12-02 DIAGNOSIS — R269 Unspecified abnormalities of gait and mobility: Secondary | ICD-10-CM | POA: Diagnosis not present

## 2013-12-02 DIAGNOSIS — M159 Polyosteoarthritis, unspecified: Secondary | ICD-10-CM | POA: Diagnosis not present

## 2013-12-07 DIAGNOSIS — IMO0001 Reserved for inherently not codable concepts without codable children: Secondary | ICD-10-CM | POA: Diagnosis not present

## 2013-12-07 DIAGNOSIS — R269 Unspecified abnormalities of gait and mobility: Secondary | ICD-10-CM | POA: Diagnosis not present

## 2013-12-07 DIAGNOSIS — M159 Polyosteoarthritis, unspecified: Secondary | ICD-10-CM | POA: Diagnosis not present

## 2013-12-07 DIAGNOSIS — I10 Essential (primary) hypertension: Secondary | ICD-10-CM | POA: Diagnosis not present

## 2013-12-07 DIAGNOSIS — E119 Type 2 diabetes mellitus without complications: Secondary | ICD-10-CM | POA: Diagnosis not present

## 2013-12-08 ENCOUNTER — Other Ambulatory Visit (HOSPITAL_COMMUNITY): Payer: Medicare Other

## 2013-12-09 DIAGNOSIS — M159 Polyosteoarthritis, unspecified: Secondary | ICD-10-CM | POA: Diagnosis not present

## 2013-12-09 DIAGNOSIS — I10 Essential (primary) hypertension: Secondary | ICD-10-CM | POA: Diagnosis not present

## 2013-12-09 DIAGNOSIS — R269 Unspecified abnormalities of gait and mobility: Secondary | ICD-10-CM | POA: Diagnosis not present

## 2013-12-09 DIAGNOSIS — IMO0001 Reserved for inherently not codable concepts without codable children: Secondary | ICD-10-CM | POA: Diagnosis not present

## 2013-12-09 DIAGNOSIS — E119 Type 2 diabetes mellitus without complications: Secondary | ICD-10-CM | POA: Diagnosis not present

## 2013-12-13 ENCOUNTER — Telehealth: Payer: Self-pay | Admitting: Family Medicine

## 2013-12-13 NOTE — Telephone Encounter (Signed)
Verbal ok given for additional visit

## 2013-12-13 NOTE — Telephone Encounter (Signed)
Lora from [facility] calling to get order for one more visit for pt using the cane please call her back at  [phone] she would like to go see patient in the morning

## 2013-12-14 ENCOUNTER — Ambulatory Visit (HOSPITAL_COMMUNITY): Payer: Medicare Other | Attending: Cardiology | Admitting: Cardiology

## 2013-12-14 DIAGNOSIS — R0602 Shortness of breath: Secondary | ICD-10-CM

## 2013-12-14 DIAGNOSIS — R269 Unspecified abnormalities of gait and mobility: Secondary | ICD-10-CM | POA: Diagnosis not present

## 2013-12-14 DIAGNOSIS — M159 Polyosteoarthritis, unspecified: Secondary | ICD-10-CM | POA: Diagnosis not present

## 2013-12-14 DIAGNOSIS — I519 Heart disease, unspecified: Secondary | ICD-10-CM | POA: Diagnosis not present

## 2013-12-14 DIAGNOSIS — E119 Type 2 diabetes mellitus without complications: Secondary | ICD-10-CM | POA: Diagnosis not present

## 2013-12-14 DIAGNOSIS — I10 Essential (primary) hypertension: Secondary | ICD-10-CM | POA: Diagnosis not present

## 2013-12-14 DIAGNOSIS — IMO0001 Reserved for inherently not codable concepts without codable children: Secondary | ICD-10-CM | POA: Diagnosis not present

## 2013-12-14 NOTE — Progress Notes (Signed)
Echo performed. 

## 2014-01-17 DIAGNOSIS — F259 Schizoaffective disorder, unspecified: Secondary | ICD-10-CM | POA: Diagnosis not present

## 2014-02-27 DIAGNOSIS — H40013 Open angle with borderline findings, low risk, bilateral: Secondary | ICD-10-CM | POA: Diagnosis not present

## 2014-02-27 DIAGNOSIS — H2513 Age-related nuclear cataract, bilateral: Secondary | ICD-10-CM | POA: Diagnosis not present

## 2014-02-27 DIAGNOSIS — H25013 Cortical age-related cataract, bilateral: Secondary | ICD-10-CM | POA: Diagnosis not present

## 2014-02-27 DIAGNOSIS — E119 Type 2 diabetes mellitus without complications: Secondary | ICD-10-CM | POA: Diagnosis not present

## 2014-03-30 DIAGNOSIS — D2312 Other benign neoplasm of skin of left eyelid, including canthus: Secondary | ICD-10-CM | POA: Diagnosis not present

## 2014-04-24 DIAGNOSIS — H04123 Dry eye syndrome of bilateral lacrimal glands: Secondary | ICD-10-CM | POA: Diagnosis not present

## 2014-04-24 DIAGNOSIS — H02839 Dermatochalasis of unspecified eye, unspecified eyelid: Secondary | ICD-10-CM | POA: Diagnosis not present

## 2014-04-24 DIAGNOSIS — H40013 Open angle with borderline findings, low risk, bilateral: Secondary | ICD-10-CM | POA: Diagnosis not present

## 2014-05-08 DIAGNOSIS — F251 Schizoaffective disorder, depressive type: Secondary | ICD-10-CM | POA: Diagnosis not present

## 2014-07-01 DIAGNOSIS — H40013 Open angle with borderline findings, low risk, bilateral: Secondary | ICD-10-CM | POA: Diagnosis not present

## 2014-07-20 ENCOUNTER — Other Ambulatory Visit: Payer: Self-pay

## 2014-07-20 DIAGNOSIS — Z1231 Encounter for screening mammogram for malignant neoplasm of breast: Secondary | ICD-10-CM

## 2014-07-24 DIAGNOSIS — F251 Schizoaffective disorder, depressive type: Secondary | ICD-10-CM | POA: Diagnosis not present

## 2014-08-02 ENCOUNTER — Other Ambulatory Visit: Payer: Self-pay

## 2014-08-02 ENCOUNTER — Ambulatory Visit
Admission: RE | Admit: 2014-08-02 | Discharge: 2014-08-02 | Disposition: A | Discharge status: Home / Self Care | Payer: Medicare Other | Source: Ambulatory Visit

## 2014-08-02 DIAGNOSIS — Z1231 Encounter for screening mammogram for malignant neoplasm of breast: Secondary | ICD-10-CM | POA: Diagnosis not present

## 2014-10-06 ENCOUNTER — Other Ambulatory Visit: Payer: Self-pay | Admitting: Family Medicine

## 2014-10-06 NOTE — Telephone Encounter (Signed)
Medication refilled per protocol. 

## 2014-11-14 DIAGNOSIS — F251 Schizoaffective disorder, depressive type: Secondary | ICD-10-CM | POA: Diagnosis not present

## 2014-11-16 ENCOUNTER — Emergency Department (HOSPITAL_COMMUNITY): Payer: Medicare Other

## 2014-11-16 ENCOUNTER — Encounter (HOSPITAL_COMMUNITY): Payer: Self-pay | Admitting: Emergency Medicine

## 2014-11-16 DIAGNOSIS — Y9389 Activity, other specified: Secondary | ICD-10-CM | POA: Insufficient documentation

## 2014-11-16 DIAGNOSIS — Z87891 Personal history of nicotine dependence: Secondary | ICD-10-CM | POA: Insufficient documentation

## 2014-11-16 DIAGNOSIS — I10 Essential (primary) hypertension: Secondary | ICD-10-CM | POA: Diagnosis not present

## 2014-11-16 DIAGNOSIS — S3992XA Unspecified injury of lower back, initial encounter: Secondary | ICD-10-CM | POA: Diagnosis not present

## 2014-11-16 DIAGNOSIS — Y9289 Other specified places as the place of occurrence of the external cause: Secondary | ICD-10-CM | POA: Diagnosis not present

## 2014-11-16 DIAGNOSIS — M545 Low back pain: Secondary | ICD-10-CM | POA: Diagnosis not present

## 2014-11-16 DIAGNOSIS — Y998 Other external cause status: Secondary | ICD-10-CM | POA: Insufficient documentation

## 2014-11-16 DIAGNOSIS — S20221A Contusion of right back wall of thorax, initial encounter: Secondary | ICD-10-CM | POA: Diagnosis not present

## 2014-11-16 DIAGNOSIS — F329 Major depressive disorder, single episode, unspecified: Secondary | ICD-10-CM | POA: Insufficient documentation

## 2014-11-16 DIAGNOSIS — W1839XA Other fall on same level, initial encounter: Secondary | ICD-10-CM | POA: Diagnosis not present

## 2014-11-16 DIAGNOSIS — Z7982 Long term (current) use of aspirin: Secondary | ICD-10-CM | POA: Diagnosis not present

## 2014-11-16 DIAGNOSIS — S0990XA Unspecified injury of head, initial encounter: Secondary | ICD-10-CM | POA: Insufficient documentation

## 2014-11-16 DIAGNOSIS — Z79899 Other long term (current) drug therapy: Secondary | ICD-10-CM | POA: Diagnosis not present

## 2014-11-16 DIAGNOSIS — F0390 Unspecified dementia without behavioral disturbance: Secondary | ICD-10-CM | POA: Diagnosis not present

## 2014-11-16 DIAGNOSIS — F209 Schizophrenia, unspecified: Secondary | ICD-10-CM | POA: Diagnosis not present

## 2014-11-16 DIAGNOSIS — S20229A Contusion of unspecified back wall of thorax, initial encounter: Secondary | ICD-10-CM | POA: Diagnosis not present

## 2014-11-16 DIAGNOSIS — Z794 Long term (current) use of insulin: Secondary | ICD-10-CM | POA: Insufficient documentation

## 2014-11-16 DIAGNOSIS — K219 Gastro-esophageal reflux disease without esophagitis: Secondary | ICD-10-CM | POA: Diagnosis not present

## 2014-11-16 DIAGNOSIS — M549 Dorsalgia, unspecified: Secondary | ICD-10-CM | POA: Diagnosis not present

## 2014-11-16 DIAGNOSIS — E119 Type 2 diabetes mellitus without complications: Secondary | ICD-10-CM | POA: Insufficient documentation

## 2014-11-16 DIAGNOSIS — S299XXA Unspecified injury of thorax, initial encounter: Secondary | ICD-10-CM | POA: Diagnosis present

## 2014-11-16 NOTE — ED Notes (Addendum)
Pt brought in by family from assisted living. PT was bending over to get a water bottle and fell backwards. Complaining of lower back pain and tenderness to back of head. She takes an asprin daily.

## 2014-11-17 ENCOUNTER — Encounter (HOSPITAL_COMMUNITY): Payer: Self-pay | Admitting: *Deleted

## 2014-11-17 ENCOUNTER — Emergency Department (HOSPITAL_COMMUNITY): Payer: Medicare Other

## 2014-11-17 ENCOUNTER — Emergency Department (HOSPITAL_COMMUNITY)
Admission: EM | Admit: 2014-11-17 | Discharge: 2014-11-17 | Disposition: A | Discharge status: Home / Self Care | Payer: Medicare Other | Attending: Emergency Medicine | Admitting: Emergency Medicine

## 2014-11-17 DIAGNOSIS — S0990XA Unspecified injury of head, initial encounter: Secondary | ICD-10-CM | POA: Diagnosis not present

## 2014-11-17 DIAGNOSIS — W19XXXA Unspecified fall, initial encounter: Secondary | ICD-10-CM

## 2014-11-17 DIAGNOSIS — T148XXA Other injury of unspecified body region, initial encounter: Secondary | ICD-10-CM

## 2014-11-17 DIAGNOSIS — S20229A Contusion of unspecified back wall of thorax, initial encounter: Secondary | ICD-10-CM | POA: Diagnosis not present

## 2014-11-17 NOTE — ED Provider Notes (Signed)
CSN: [id number]     Arrival date & time 11/16/14  2240 History   This chart was scribed for Varney Biles, MD by Forrestine Him, ED Scribe. This patient was seen in room A09C/A09C and the patient's care was started 3:47 AM.   Chief Complaint  Patient presents with  . Fall  . Head Injury  . Back Pain   The history is provided by the patient. No language interpreter was used.    HPI Comments: Bonnie Watkins, here with her daughter is a 80 y.o. female who presents to the Emergency Department here after an witnessed fall this evening. Daughter states per staff, pt went to grab some water and lost her balance resulting in her falling backwards 1-3 hours prior to arrival. Pt was previously medicated with nightly scheduled medications prior to fall. She now c/o constant, ongoing generalized rib pain and lower back pain. However, per triage note, pt initially complained of tenderness to the back of her head as well. No OTC medications or home remedies attempted prior to arrival. No recent fever, chills, chest pain, shortness of breath, or abdominal pain.  Past Medical History  Diagnosis Date  . Diabetes mellitus without complication   . Schizophrenia   . Hypertension   . Dementia   . Depression   . GERD (gastroesophageal reflux disease)   . ACE inhibitor-aggravated angioedema   . Diverticulosis    History reviewed. No pertinent past surgical history. History reviewed. No pertinent family history. History  Substance Use Topics  . Smoking status: Former Research scientist (life sciences)  . Smokeless tobacco: Not on file  . Alcohol Use: No   OB History    No data available     Review of Systems  Constitutional: Negative for fever and chills.  Respiratory: Negative for cough and shortness of breath.   Gastrointestinal: Negative for nausea, vomiting and abdominal pain.  Musculoskeletal: Positive for back pain and arthralgias.  Neurological: Negative for weakness and numbness.  All other systems reviewed  and are negative.     Allergies  Shellfish allergy; Ace inhibitors; Angiotensin receptor blockers; Lisinopril; Strawberry; and Robaxin  Home Medications   Prior to Admission medications   Medication Sig Start Date End Date Taking? Authorizing Provider  acetaminophen (TYLENOL) 500 MG tablet Take 500 mg by mouth every 6 (six) hours as needed for mild pain.   Yes Historical Provider, MD  aspirin EC 81 MG tablet Take 81 mg by mouth daily.   Yes Historical Provider, MD  benztropine (COGENTIN) 1 MG tablet Take 1 mg by mouth 2 (two) times daily.   Yes Historical Provider, MD  calcium carbonate (TUMS - DOSED IN MG ELEMENTAL CALCIUM) 500 MG chewable tablet Chew 1 tablet by mouth 3 (three) times daily as needed for indigestion or heartburn.   Yes Historical Provider, MD  citalopram (CELEXA) 20 MG tablet Take 20 mg by mouth daily.   Yes Historical Provider, MD  docusate sodium (COLACE) 250 MG capsule 2 caps po bid Patient taking differently: 2 caps twice daily as needed for constipation. 05/17/13  Yes Susy Frizzle, MD  hydrochlorothiazide (HYDRODIURIL) 25 MG tablet Take 25 mg by mouth daily.   Yes Historical Provider, MD  insulin glargine (LANTUS) 100 UNIT/ML injection Inject 12 Units into the skin every morning.   Yes Historical Provider, MD  levothyroxine (SYNTHROID, LEVOTHROID) 50 MCG tablet Take 50 mcg by mouth daily before breakfast.   Yes Historical Provider, MD  memantine (NAMENDA) 10 MG tablet Take 1 tablet (10  mg total) by mouth 2 (two) times daily. 09/21/12  Yes Susy Frizzle, MD  metFORMIN (GLUCOPHAGE) 1000 MG tablet Take 1,000 mg by mouth 2 (two) times daily.   Yes Historical Provider, MD  omeprazole (PRILOSEC) 40 MG capsule TAKE (1) CAPSULE BY MOUTH ONCE DAILY. 10/06/14  Yes Susy Frizzle, MD  ondansetron (ZOFRAN ODT) 8 MG disintegrating tablet Take 1 tablet (8 mg total) by mouth every 8 (eight) hours as needed for nausea. 10/26/13  Yes Varney Biles, MD  perphenazine (TRILAFON) 4  MG tablet Take 6 mg by mouth every morning.    Yes Historical Provider, MD  polyethylene glycol (MIRALAX / GLYCOLAX) packet Take 17 g by mouth 2 (two) times daily as needed for mild constipation.   Yes Historical Provider, MD  QUEtiapine (SEROQUEL) 200 MG tablet Take 200 mg by mouth at bedtime.   Yes Historical Provider, MD  traMADol (ULTRAM) 50 MG tablet TAKE (1) TABLET BY MOUTH TWICE DAILY.   Yes Susy Frizzle, MD  traZODone (DESYREL) 150 MG tablet Take 150 mg by mouth at bedtime as needed for sleep.    Yes Historical Provider, MD  amLODipine (NORVASC) 10 MG tablet Take 10 mg by mouth daily.    Historical Provider, MD  Carboxymethylcellul-Glycerin (OPTIVE) 0.5-0.9 % SOLN Place 1 drop into both eyes 3 (three) times daily.    Historical Provider, MD  loratadine (CLARITIN) 10 MG tablet Take 10 mg by mouth daily.    Historical Provider, MD   Triage Vitals: BP 145/62 mmHg  Pulse 67  Temp(Src) 98.1 F (36.7 C) (Oral)  Resp 16  SpO2 99%   Physical Exam  Constitutional: She is oriented to person, place, and time. She appears well-developed and well-nourished.  HENT:  Head: Normocephalic.  Eyes: EOM are normal.  Neck: Normal range of motion.  Pulmonary/Chest: Effort normal.  Tenderness to palpation over R lower thoracic region without ecchymosis   Abdominal: Soft. She exhibits no distension. There is no tenderness.  Musculoskeletal: Normal range of motion.  No midline C-spine tenderness Lower thoracic spine tenderness with mild ecchymoses over posterior thoracic region inferiorly   Head to toe evaluation shows no hematoma, bleeding of the scalp, no facial abrasions, step offs, crepitus, no tenderness to palpation of the bilateral upper and lower extremities, no gross deformities, no chest tenderness, no pelvic pain.   Neurological: She is alert and oriented to person, place, and time.  Psychiatric: She has a normal mood and affect.  Nursing note and vitals reviewed.   ED Course   Procedures (including critical care time)  DIAGNOSTIC STUDIES: Oxygen Saturation is 98% on RA, Normal by my interpretation.    COORDINATION OF CARE: 3:50 AM- Will order CT head without contrast, DG thoracic spine 2 views, and DG lumbar spine complete. Discussed treatment plan with pt at bedside and pt agreed to plan.     Labs Review Labs Reviewed - No data to display  Imaging Review Dg Thoracic Spine 2 View  11/16/2014   CLINICAL DATA:  Status post fall, with mid and lower back pain. Initial encounter.  EXAM: THORACIC SPINE - 2-3 VIEWS  COMPARISON:  None.  FINDINGS: There is no evidence of fracture or subluxation. Vertebral bodies demonstrate normal height and alignment. Intervertebral disc spaces are preserved. Lateral and anterior osteophytes are noted along the thoracic spine.  The visualized portions of both lungs are clear. The mediastinum is unremarkable in appearance.  IMPRESSION: No evidence of fracture or subluxation along the thoracic spine.  Electronically Signed   By: Garald Balding M.D.   On: 11/16/2014 23:53   Dg Lumbar Spine Complete  11/16/2014   CLINICAL DATA:  Status post fall, with mid and lower back pain. Initial encounter.  EXAM: LUMBAR SPINE - COMPLETE 4+ VIEW  COMPARISON:  CT of the abdomen and pelvis from 10/26/2013  FINDINGS: There is no evidence of fracture or subluxation. Vertebral bodies demonstrate normal height and alignment. Mild multilevel disc space narrowing is noted along the lumbar spine. Scattered anterior and lateral osteophytes are seen along the lumbar spine.  The visualized bowel gas pattern is unremarkable in appearance; air and stool are noted within the colon. The sacroiliac joints are within normal limits.  IMPRESSION: No evidence of fracture or subluxation along the lumbar spine.   Electronically Signed   By: Garald Balding M.D.   On: 11/16/2014 23:54   Ct Head Wo Contrast  11/17/2014   CLINICAL DATA:  80 year old female with fall  EXAM: CT HEAD  WITHOUT CONTRAST  TECHNIQUE: Contiguous axial images were obtained from the base of the skull through the vertex without intravenous contrast.  COMPARISON:  Brain MRI dated 03/30/2012 and PET-CT dated 03/08/2011  FINDINGS: There is slight prominence of the ventricles and sulci compatible with age-related volume loss. Mild periventricular and deep white matter hypodensities represent chronic microvascular ischemic changes. There is no intracranial hemorrhage. No mass effect or midline shift identified.  The visualized paranasal sinuses and mastoid air cells are well aerated. The calvarium is intact.  IMPRESSION: No acute intracranial pathology.  Mild age-related atrophy and chronic microvascular ischemic disease.   Electronically Signed   By: Anner Crete M.D.   On: 11/17/2014 00:38     EKG Interpretation None      MDM   Final diagnoses:  Fall, initial encounter  Contusion    I personally performed the services described in this documentation, which was scribed in my presence. The recorded information has been reviewed and is accurate.  DDx includes: - Mechanical falls - ICH - Fractures - Contusions - Soft tissue injury  Pt with fall that occurred several hours ago. Family reports that the fall was mechanical, and likely due to the effects of the sleep meds. Pt has some back pain. Moving all 4, no neuro deficits, and neg imaging of the hip, back and head.  Varney Biles, MD 11/17/14 [phone]

## 2014-11-17 NOTE — Discharge Instructions (Signed)
Take tylenol or motrin for pain. All the results in the ER after the fall are normal - no fractures, dislocations. Expect the pain to get worse in the next 1-2 days   Contusion A contusion is a deep bruise. Contusions are the result of an injury that caused bleeding under the skin. The contusion may turn blue, purple, or yellow. Minor injuries will give you a painless contusion, but more severe contusions may stay painful and swollen for a few weeks.  CAUSES  A contusion is usually caused by a blow, trauma, or direct force to an area of the body. SYMPTOMS   Swelling and redness of the injured area.  Bruising of the injured area.  Tenderness and soreness of the injured area.  Pain. DIAGNOSIS  The diagnosis can be made by taking a history and physical exam. An X-ray, CT scan, or MRI may be needed to determine if there were any associated injuries, such as fractures. TREATMENT  Specific treatment will depend on what area of the body was injured. In general, the best treatment for a contusion is resting, icing, elevating, and applying cold compresses to the injured area. Over-the-counter medicines may also be recommended for pain control. Ask your caregiver what the best treatment is for your contusion. HOME CARE INSTRUCTIONS   Put ice on the injured area.  Put ice in a plastic bag.  Place a towel between your skin and the bag.  Leave the ice on for 15-20 minutes, 3-4 times a day, or as directed by your health care provider.  Only take over-the-counter or prescription medicines for pain, discomfort, or fever as directed by your caregiver. Your caregiver may recommend avoiding anti-inflammatory medicines (aspirin, ibuprofen, and naproxen) for 48 hours because these medicines may increase bruising.  Rest the injured area.  If possible, elevate the injured area to reduce swelling. SEEK IMMEDIATE MEDICAL CARE IF:   You have increased bruising or swelling.  You have pain that is  getting worse.  Your swelling or pain is not relieved with medicines. MAKE SURE YOU:   Understand these instructions.  Will watch your condition.  Will get help right away if you are not doing well or get worse. Document Released: 02/05/2005 Document Revised: 05/03/2013 Document Reviewed: 03/03/2011 [facility] Patient Information 2015 [place], [place]. This information is not intended to replace advice given to you by your health care provider. Make sure you discuss any questions you have with your health care provider.

## 2014-11-23 ENCOUNTER — Encounter: Payer: Self-pay | Admitting: Family Medicine

## 2014-11-23 ENCOUNTER — Ambulatory Visit (INDEPENDENT_AMBULATORY_CARE_PROVIDER_SITE_OTHER): Payer: Medicare Other | Admitting: Family Medicine

## 2014-11-23 VITALS — BP 128/58 | HR 84 | Temp 98.5°F | Resp 18 | Ht 67.0 in | Wt 232.0 lb

## 2014-11-23 DIAGNOSIS — I10 Essential (primary) hypertension: Secondary | ICD-10-CM | POA: Diagnosis not present

## 2014-11-23 DIAGNOSIS — Z13820 Encounter for screening for osteoporosis: Secondary | ICD-10-CM

## 2014-11-23 DIAGNOSIS — E038 Other specified hypothyroidism: Secondary | ICD-10-CM

## 2014-11-23 DIAGNOSIS — E119 Type 2 diabetes mellitus without complications: Secondary | ICD-10-CM | POA: Diagnosis not present

## 2014-11-23 DIAGNOSIS — R296 Repeated falls: Secondary | ICD-10-CM

## 2014-11-23 DIAGNOSIS — E871 Hypo-osmolality and hyponatremia: Secondary | ICD-10-CM

## 2014-11-23 NOTE — Progress Notes (Signed)
Subjective:    Patient ID: Bonnie Watkins, female    DOB: 1935-01-03, 80 y.o.   MRN: [id number]  HPI Patient has not been seen in over a year. She has a past medical history of insulin-dependent diabetes. She states that her blood sugars are always below 200. She denies any hypoglycemia. Her blood pressure is well controlled today 128/58. She denies any chest pain shortness of breath or dyspnea on exertion. Pneumovax 23 and Prevnar 13 are up-to-date. Patient had a mammogram that was normal in March. She is due for a bone density test. Based on her age she does not require colonoscopy or Pap smear. Unfortunately recently she had to go the hospital after she fell and injured her thoracic back as well as struck her occiput. X-rays of the thoracic spine were negative. Head CT was negative. She did sprain her right knee when she fell. Patient states that over the last few weeks she has been feeling more and more unsteady. She also complains of weakness with walking. I ambulated with the patient in the hallway today and she does have a shuffling gait. She also staggers and returned to go in the room. Patient is on several psychiatric medicines. She has no cough or rigidity but she does have a mild resting pill-rolling tremor in her left hand. She is working with a psychiatrist to wean her off her psychiatric medication. However she would like a referral to physical therapy to help her deconditioning and her poor balance which I believe is totally appropriate Past Medical History  Diagnosis Date  . Diabetes mellitus without complication   . Schizophrenia   . Hypertension   . Dementia   . Depression   . GERD (gastroesophageal reflux disease)   . ACE inhibitor-aggravated angioedema   . Diverticulosis    No past surgical history on file. Current Outpatient Prescriptions on File Prior to Visit  Medication Sig Dispense Refill  . acetaminophen (TYLENOL) 500 MG tablet Take 500 mg by mouth every 6  (six) hours as needed for mild pain.    Marland Kitchen amLODipine (NORVASC) 10 MG tablet Take 10 mg by mouth daily.    Marland Kitchen aspirin EC 81 MG tablet Take 81 mg by mouth daily.    . benztropine (COGENTIN) 1 MG tablet Take 1 mg by mouth 2 (two) times daily.    . calcium carbonate (TUMS - DOSED IN MG ELEMENTAL CALCIUM) 500 MG chewable tablet Chew 1 tablet by mouth 3 (three) times daily as needed for indigestion or heartburn.    . Carboxymethylcellul-Glycerin (OPTIVE) 0.5-0.9 % SOLN Place 1 drop into both eyes 3 (three) times daily.    . citalopram (CELEXA) 20 MG tablet Take 20 mg by mouth daily.    Marland Kitchen docusate sodium (COLACE) 250 MG capsule 2 caps po bid (Patient taking differently: 2 caps twice daily as needed for constipation.) 120 capsule 11  . hydrochlorothiazide (HYDRODIURIL) 25 MG tablet Take 25 mg by mouth daily.    . insulin glargine (LANTUS) 100 UNIT/ML injection Inject 12 Units into the skin every morning.    Marland Kitchen levothyroxine (SYNTHROID, LEVOTHROID) 50 MCG tablet Take 50 mcg by mouth daily before breakfast.    . loratadine (CLARITIN) 10 MG tablet Take 10 mg by mouth daily.    . memantine (NAMENDA) 10 MG tablet Take 1 tablet (10 mg total) by mouth 2 (two) times daily. 60 tablet 2  . metFORMIN (GLUCOPHAGE) 1000 MG tablet Take 1,000 mg by mouth 2 (two) times daily.    Marland Kitchen  omeprazole (PRILOSEC) 40 MG capsule TAKE (1) CAPSULE BY MOUTH ONCE DAILY. 30 capsule 3  . ondansetron (ZOFRAN ODT) 8 MG disintegrating tablet Take 1 tablet (8 mg total) by mouth every 8 (eight) hours as needed for nausea. 20 tablet 0  . perphenazine (TRILAFON) 4 MG tablet Take 6 mg by mouth every morning.     Marland Kitchen QUEtiapine (SEROQUEL) 200 MG tablet Take 200 mg by mouth at bedtime.    . traMADol (ULTRAM) 50 MG tablet TAKE (1) TABLET BY MOUTH TWICE DAILY. 60 tablet 2  . traZODone (DESYREL) 150 MG tablet Take 150 mg by mouth at bedtime as needed for sleep.      No current facility-administered medications on file prior to visit.   Allergies    Allergen Reactions  . Shellfish Allergy Anaphylaxis    ALL SEAFOOD ALLERGY  . Ace Inhibitors Other (See Comments)    On MAR  . Angiotensin Receptor Blockers Other (See Comments)    ON MAR  . Lisinopril     Angioedema  . Strawberry Hives  . Robaxin [Methocarbamol] Rash   History   Social History  . Marital Status: Divorced    Spouse Name: N/A  . Number of Children: N/A  . Years of Education: N/A   Occupational History  . Not on file.   Social History Main Topics  . Smoking status: Former Research scientist (life sciences)  . Smokeless tobacco: Not on file  . Alcohol Use: No  . Drug Use: No  . Sexual Activity: No     Comment: lives at [facility].     Other Topics Concern  . Not on file   Social History Narrative     Review of Systems  All other systems reviewed and are negative.      Objective:   Physical Exam  Constitutional: She appears well-developed and well-nourished.  Cardiovascular: Normal rate, regular rhythm and normal heart sounds.   No murmur heard. Pulmonary/Chest: Effort normal and breath sounds normal. No respiratory distress. She has no wheezes. She has no rales.  Abdominal: Soft. Bowel sounds are normal. She exhibits no distension. There is no tenderness. There is no rebound and no guarding.  Musculoskeletal: She exhibits no edema.  Skin: No rash noted.  Vitals reviewed.  [phone] dr. lay       Assessment & Plan:  Hyponatremia  Essential hypertension  Diabetes mellitus type II, controlled - Plan: COMPLETE METABOLIC PANEL WITH GFR, CBC with Differential/Platelet, Hemoglobin A1c, Microalbumin, urine  Other specified hypothyroidism - Plan: TSH  Falls frequently  Patient has a history of hyponatremia and so therefore I'll recheck a sodium level to make sure the low sodium did not contribute to her dizziness and falls. Her blood pressures well controlled today. I will check a TSH to make sure that her levothyroxine is in the therapeutic range. I will also  check a hemoglobin A1c and a urine microalbumin to monitor the management of her diabetes. Her immunizations are up-to-date. I will schedule the patient for physical therapy. I'll also schedule her for a bone density test.

## 2014-11-24 LAB — COMPLETE METABOLIC PANEL WITH GFR
ALBUMIN: 3.9 g/dL (ref 3.5–5.2)
ALK PHOS: 50 U/L (ref 39–117)
ALT: 12 U/L (ref 0–35)
AST: 16 U/L (ref 0–37)
BUN: 11 mg/dL (ref 6–23)
CALCIUM: 8.8 mg/dL (ref 8.4–10.5)
CO2: 27 meq/L (ref 19–32)
CREATININE: 0.69 mg/dL (ref 0.50–1.10)
Chloride: 93 mEq/L — ABNORMAL LOW (ref 96–112)
GFR, Est African American: >89 mL/min
GFR, Est Non African American: 84 mL/min
GLUCOSE: 98 mg/dL (ref 70–99)
POTASSIUM: 4.1 meq/L (ref 3.5–5.3)
Sodium: 130 mEq/L — ABNORMAL LOW (ref 135–145)
Total Bilirubin: 0.4 mg/dL (ref 0.2–1.2)
Total Protein: 6.9 g/dL (ref 6.0–8.3)

## 2014-11-24 LAB — CBC WITH DIFFERENTIAL/PLATELET
BASOS PCT: 0 % (ref 0–1)
Basophils Absolute: 0 10*3/uL (ref 0.0–0.1)
EOS ABS: 0.2 10*3/uL (ref 0.0–0.7)
EOS PCT: 2 % (ref 0–5)
HCT: 34.9 % — ABNORMAL LOW (ref 36.0–46.0)
HEMOGLOBIN: 11.9 g/dL — AB (ref 12.0–15.0)
LYMPHS PCT: 43 % (ref 12–46)
Lymphs Abs: 3.5 10*3/uL (ref 0.7–4.0)
MCH: 31.1 pg (ref 26.0–34.0)
MCHC: 34.1 g/dL (ref 30.0–36.0)
MCV: 91.1 fL (ref 78.0–100.0)
MPV: 9 fL (ref 8.6–12.4)
Monocytes Absolute: 0.6 10*3/uL (ref 0.1–1.0)
Monocytes Relative: 7 % (ref 3–12)
NEUTROS ABS: 3.9 10*3/uL (ref 1.7–7.7)
Neutrophils Relative %: 48 % (ref 43–77)
Platelets: 200 10*3/uL (ref 150–400)
RBC: 3.83 MIL/uL — ABNORMAL LOW (ref 3.87–5.11)
RDW: 14.1 % (ref 11.5–15.5)
WBC: 8.2 10*3/uL (ref 4.0–10.5)

## 2014-11-24 LAB — HEMOGLOBIN A1C
Hgb A1c MFr Bld: 6.6 % — ABNORMAL HIGH (ref <5.7)
Mean Plasma Glucose: 143 mg/dL — ABNORMAL HIGH (ref <117)

## 2014-11-24 LAB — MICROALBUMIN, URINE: Microalb, Ur: 0.2 mg/dL (ref <2.0)

## 2014-11-24 LAB — TSH: TSH: 1.696 u[IU]/mL (ref 0.350–4.500)

## 2014-11-27 ENCOUNTER — Other Ambulatory Visit: Payer: Self-pay | Admitting: Family Medicine

## 2014-11-27 ENCOUNTER — Telehealth: Payer: Self-pay | Admitting: *Deleted

## 2014-11-27 DIAGNOSIS — E2839 Other primary ovarian failure: Secondary | ICD-10-CM

## 2014-11-27 NOTE — Telephone Encounter (Signed)
Pt has appt scheuduled at the [facility] for Bone Density on July 26th at 1:15pm arrival for 1:30 appt, pt daughter is aware of appt

## 2014-11-28 ENCOUNTER — Encounter: Payer: Self-pay | Admitting: Family Medicine

## 2014-12-04 ENCOUNTER — Telehealth: Payer: Self-pay | Admitting: Family Medicine

## 2014-12-04 DIAGNOSIS — S4991XA Unspecified injury of right shoulder and upper arm, initial encounter: Secondary | ICD-10-CM

## 2014-12-04 NOTE — Telephone Encounter (Signed)
I am ok with xray if needed.

## 2014-12-04 NOTE — Telephone Encounter (Signed)
[facility] again at [facility] Friday.  Pt says felt dizzy and fell.  Injury to rt arm and shoulder.  Large bruise to area.  Area very sore, no loss in motion.  Daughter has been putting ice to area.  Daughter took cane away and gave mother walker.  Has appt for [facility] tomorrow, would like to get xray of arm while there.  Explained again to daughter about fluid restrictions.  Need to send [facility] Therapy to [facility] and they will arrange for agency to come there.  Spoke with Carla Drape, Actuary.  She said send PT referral to [facility], they come to their facility.  Also asked her about fall, daughter wanted xray.  She is going to go see resident and call me back if we feel xray needed.

## 2014-12-04 NOTE — Telephone Encounter (Signed)
ok 

## 2014-12-04 NOTE — Telephone Encounter (Signed)
Care director at home checked on Resident.  Large bruise on upper arm (humerous)  Pain and swelling in wrist area.  Will get xray of wrist area.

## 2014-12-05 ENCOUNTER — Ambulatory Visit
Admission: RE | Admit: 2014-12-05 | Discharge: 2014-12-05 | Disposition: A | Discharge status: Home / Self Care | Payer: Medicare Other | Source: Ambulatory Visit | Attending: Family Medicine | Admitting: Family Medicine

## 2014-12-05 DIAGNOSIS — M818 Other osteoporosis without current pathological fracture: Secondary | ICD-10-CM | POA: Diagnosis not present

## 2014-12-05 DIAGNOSIS — S6991XA Unspecified injury of right wrist, hand and finger(s), initial encounter: Secondary | ICD-10-CM | POA: Diagnosis not present

## 2014-12-05 DIAGNOSIS — E2839 Other primary ovarian failure: Secondary | ICD-10-CM

## 2014-12-05 DIAGNOSIS — M25531 Pain in right wrist: Secondary | ICD-10-CM | POA: Diagnosis not present

## 2014-12-05 DIAGNOSIS — S4991XA Unspecified injury of right shoulder and upper arm, initial encounter: Secondary | ICD-10-CM

## 2014-12-06 ENCOUNTER — Other Ambulatory Visit: Payer: Self-pay | Admitting: Family Medicine

## 2014-12-06 DIAGNOSIS — F329 Major depressive disorder, single episode, unspecified: Secondary | ICD-10-CM | POA: Diagnosis not present

## 2014-12-06 DIAGNOSIS — I10 Essential (primary) hypertension: Secondary | ICD-10-CM | POA: Diagnosis not present

## 2014-12-06 DIAGNOSIS — M25511 Pain in right shoulder: Secondary | ICD-10-CM | POA: Diagnosis not present

## 2014-12-06 DIAGNOSIS — E119 Type 2 diabetes mellitus without complications: Secondary | ICD-10-CM | POA: Diagnosis not present

## 2014-12-06 DIAGNOSIS — F209 Schizophrenia, unspecified: Secondary | ICD-10-CM | POA: Diagnosis not present

## 2014-12-06 DIAGNOSIS — F0390 Unspecified dementia without behavioral disturbance: Secondary | ICD-10-CM | POA: Diagnosis not present

## 2014-12-06 MED ORDER — ALENDRONATE SODIUM 70 MG PO TABS: 70.0000 mg | ORAL_TABLET | ORAL | Status: DC

## 2014-12-08 DIAGNOSIS — F209 Schizophrenia, unspecified: Secondary | ICD-10-CM | POA: Diagnosis not present

## 2014-12-08 DIAGNOSIS — F329 Major depressive disorder, single episode, unspecified: Secondary | ICD-10-CM | POA: Diagnosis not present

## 2014-12-08 DIAGNOSIS — F0390 Unspecified dementia without behavioral disturbance: Secondary | ICD-10-CM | POA: Diagnosis not present

## 2014-12-08 DIAGNOSIS — I10 Essential (primary) hypertension: Secondary | ICD-10-CM | POA: Diagnosis not present

## 2014-12-08 DIAGNOSIS — M25511 Pain in right shoulder: Secondary | ICD-10-CM | POA: Diagnosis not present

## 2014-12-08 DIAGNOSIS — E119 Type 2 diabetes mellitus without complications: Secondary | ICD-10-CM | POA: Diagnosis not present

## 2014-12-11 ENCOUNTER — Telehealth: Payer: Self-pay | Admitting: Family Medicine

## 2014-12-11 DIAGNOSIS — M25511 Pain in right shoulder: Secondary | ICD-10-CM | POA: Diagnosis not present

## 2014-12-11 DIAGNOSIS — F329 Major depressive disorder, single episode, unspecified: Secondary | ICD-10-CM | POA: Diagnosis not present

## 2014-12-11 DIAGNOSIS — E119 Type 2 diabetes mellitus without complications: Secondary | ICD-10-CM | POA: Diagnosis not present

## 2014-12-11 DIAGNOSIS — F209 Schizophrenia, unspecified: Secondary | ICD-10-CM | POA: Diagnosis not present

## 2014-12-11 DIAGNOSIS — I10 Essential (primary) hypertension: Secondary | ICD-10-CM | POA: Diagnosis not present

## 2014-12-11 DIAGNOSIS — F0390 Unspecified dementia without behavioral disturbance: Secondary | ICD-10-CM | POA: Diagnosis not present

## 2014-12-11 NOTE — Telephone Encounter (Signed)
Bonnie Watkins is aware of normal Xray - any suggestions before I call her back?

## 2014-12-11 NOTE — Telephone Encounter (Signed)
[facility] patients daughter calling to get xray results from wrist xray, still having a lot of pain  [phone]

## 2014-12-12 NOTE — Telephone Encounter (Signed)
LMTRC

## 2014-12-12 NOTE — Telephone Encounter (Signed)
Spoke to [place] and she is going to see pt this afternoon and will evaluate her wrist and if needed she will make an appt. She uses that wrist to hold on to her walker.

## 2014-12-12 NOTE — Telephone Encounter (Signed)
Would likely recommend a thumb spica splint.  But if hurting that bad, likely NTBS.

## 2014-12-14 ENCOUNTER — Encounter (HOSPITAL_COMMUNITY): Payer: Self-pay | Admitting: Emergency Medicine

## 2014-12-14 ENCOUNTER — Emergency Department (HOSPITAL_COMMUNITY): Payer: Medicare Other

## 2014-12-14 ENCOUNTER — Emergency Department (HOSPITAL_COMMUNITY)
Admission: EM | Admit: 2014-12-14 | Discharge: 2014-12-14 | Disposition: A | Discharge status: Home / Self Care | Payer: Medicare Other | Attending: Emergency Medicine | Admitting: Emergency Medicine

## 2014-12-14 DIAGNOSIS — Z87891 Personal history of nicotine dependence: Secondary | ICD-10-CM | POA: Insufficient documentation

## 2014-12-14 DIAGNOSIS — W1839XA Other fall on same level, initial encounter: Secondary | ICD-10-CM | POA: Diagnosis not present

## 2014-12-14 DIAGNOSIS — K219 Gastro-esophageal reflux disease without esophagitis: Secondary | ICD-10-CM | POA: Diagnosis not present

## 2014-12-14 DIAGNOSIS — Y929 Unspecified place or not applicable: Secondary | ICD-10-CM | POA: Insufficient documentation

## 2014-12-14 DIAGNOSIS — S80212A Abrasion, left knee, initial encounter: Secondary | ICD-10-CM | POA: Diagnosis not present

## 2014-12-14 DIAGNOSIS — S199XXA Unspecified injury of neck, initial encounter: Secondary | ICD-10-CM | POA: Diagnosis not present

## 2014-12-14 DIAGNOSIS — Y999 Unspecified external cause status: Secondary | ICD-10-CM | POA: Insufficient documentation

## 2014-12-14 DIAGNOSIS — Y939 Activity, unspecified: Secondary | ICD-10-CM | POA: Diagnosis not present

## 2014-12-14 DIAGNOSIS — E119 Type 2 diabetes mellitus without complications: Secondary | ICD-10-CM | POA: Insufficient documentation

## 2014-12-14 DIAGNOSIS — F329 Major depressive disorder, single episode, unspecified: Secondary | ICD-10-CM | POA: Diagnosis not present

## 2014-12-14 DIAGNOSIS — S0990XA Unspecified injury of head, initial encounter: Secondary | ICD-10-CM | POA: Diagnosis not present

## 2014-12-14 DIAGNOSIS — I10 Essential (primary) hypertension: Secondary | ICD-10-CM | POA: Diagnosis not present

## 2014-12-14 DIAGNOSIS — F209 Schizophrenia, unspecified: Secondary | ICD-10-CM | POA: Insufficient documentation

## 2014-12-14 DIAGNOSIS — F0390 Unspecified dementia without behavioral disturbance: Secondary | ICD-10-CM | POA: Diagnosis not present

## 2014-12-14 DIAGNOSIS — R51 Headache: Secondary | ICD-10-CM | POA: Diagnosis not present

## 2014-12-14 DIAGNOSIS — S00212A Abrasion of left eyelid and periocular area, initial encounter: Secondary | ICD-10-CM | POA: Diagnosis not present

## 2014-12-14 DIAGNOSIS — S0993XA Unspecified injury of face, initial encounter: Secondary | ICD-10-CM | POA: Diagnosis present

## 2014-12-14 DIAGNOSIS — Z79899 Other long term (current) drug therapy: Secondary | ICD-10-CM | POA: Diagnosis not present

## 2014-12-14 DIAGNOSIS — Z7982 Long term (current) use of aspirin: Secondary | ICD-10-CM | POA: Insufficient documentation

## 2014-12-14 DIAGNOSIS — S0081XA Abrasion of other part of head, initial encounter: Secondary | ICD-10-CM | POA: Diagnosis not present

## 2014-12-14 DIAGNOSIS — W19XXXA Unspecified fall, initial encounter: Secondary | ICD-10-CM

## 2014-12-14 NOTE — ED Notes (Signed)
Per EMS, pt from [facility].  Pt had fall at around 8:20 am.  Pt fall unwitnessed.  Pt found sitting but has abrasion to knee and forehead.  Pt c/o neck, left forehead, left knee, rt shoulder, rt wrist.  No deformity noted.  No LOC.  Pt was making a bed when fell.  Pt is alert and oriented.  Pt able to stand to get on stretcher.  Neck collar in place.  Vitals: htn at first 210/90 but has improved slightly to 144/114.  Hr 80, resp 18, cbg 155.  Pt has not taken meds.  Arrival bp now 154/67

## 2014-12-14 NOTE — ED Provider Notes (Signed)
CSN: [id number]     Arrival date & time 12/14/14  1962 History   First MD Initiated Contact with Patient 12/14/14 1013     Chief Complaint  Patient presents with  . Fall     (Consider location/radiation/quality/duration/timing/severity/associated sxs/prior Treatment) Patient is a 80 y.o. female presenting with fall.  Fall This is a recurrent problem. The current episode started 1 to 2 hours ago. Episode frequency: once. The problem has been resolved. Associated symptoms include headaches (mild). Pertinent negatives include no chest pain, no abdominal pain and no shortness of breath. Nothing aggravates the symptoms. Nothing relieves the symptoms.    Past Medical History  Diagnosis Date  . Diabetes mellitus without complication   . Schizophrenia   . Hypertension   . Dementia   . Depression   . GERD (gastroesophageal reflux disease)   . ACE inhibitor-aggravated angioedema   . Diverticulosis    History reviewed. No pertinent past surgical history. No family history on file. History  Substance Use Topics  . Smoking status: Former Research scientist (life sciences)  . Smokeless tobacco: Not on file  . Alcohol Use: No   OB History    No data available     Review of Systems  Respiratory: Negative for shortness of breath.   Cardiovascular: Negative for chest pain.  Gastrointestinal: Negative for abdominal pain.  Neurological: Positive for headaches (mild).  All other systems reviewed and are negative.     Allergies  Shellfish allergy; Ace inhibitors; Angiotensin receptor blockers; Lisinopril; Strawberry; and Robaxin  Home Medications   Prior to Admission medications   Medication Sig Start Date End Date Taking? Authorizing Provider  acetaminophen (TYLENOL) 500 MG tablet Take 500 mg by mouth every 6 (six) hours as needed for mild pain.   Yes Historical Provider, MD  aspirin EC 81 MG tablet Take 81 mg by mouth daily.   Yes Historical Provider, MD  benztropine (COGENTIN) 1 MG tablet Take 1 mg by mouth  2 (two) times daily.   Yes Historical Provider, MD  calcium carbonate (TUMS - DOSED IN MG ELEMENTAL CALCIUM) 500 MG chewable tablet Chew 1 tablet by mouth 3 (three) times daily as needed for indigestion or heartburn.   Yes Historical Provider, MD  citalopram (CELEXA) 20 MG tablet Take 20 mg by mouth daily.   Yes Historical Provider, MD  docusate sodium (COLACE) 250 MG capsule 2 caps po bid Patient taking differently: 2 caps twice daily as needed for constipation. 05/17/13  Yes Susy Frizzle, MD  hydrochlorothiazide (HYDRODIURIL) 25 MG tablet Take 25 mg by mouth daily.   Yes Historical Provider, MD  insulin glargine (LANTUS) 100 UNIT/ML injection Inject 12 Units into the skin every morning.   Yes Historical Provider, MD  levothyroxine (SYNTHROID, LEVOTHROID) 50 MCG tablet Take 50 mcg by mouth daily before breakfast.   Yes Historical Provider, MD  memantine (NAMENDA) 10 MG tablet Take 1 tablet (10 mg total) by mouth 2 (two) times daily. 09/21/12  Yes Susy Frizzle, MD  metFORMIN (GLUCOPHAGE) 1000 MG tablet Take 1,000 mg by mouth 2 (two) times daily.   Yes Historical Provider, MD  omeprazole (PRILOSEC) 40 MG capsule TAKE (1) CAPSULE BY MOUTH ONCE DAILY. Patient taking differently: TAKE 40 MG BY MOUTH ONCE DAILY 10/06/14  Yes Susy Frizzle, MD  ondansetron (ZOFRAN ODT) 8 MG disintegrating tablet Take 1 tablet (8 mg total) by mouth every 8 (eight) hours as needed for nausea. 10/26/13  Yes Varney Biles, MD  perphenazine (TRILAFON) 4 MG tablet Take  6 mg by mouth every morning.    Yes Historical Provider, MD  polyethylene glycol (MIRALAX / GLYCOLAX) packet Take 17 g by mouth daily as needed for mild constipation or moderate constipation. Mix with 8 oz of liquid   Yes Historical Provider, MD  QUEtiapine (SEROQUEL) 200 MG tablet Take 200 mg by mouth at bedtime.   Yes Historical Provider, MD  traMADol (ULTRAM) 50 MG tablet TAKE (1) TABLET BY MOUTH TWICE DAILY. Patient taking differently: TAKE 50 MG BY  MOUTH TWICE DAILY.   Yes Susy Frizzle, MD  traZODone (DESYREL) 150 MG tablet Take 150 mg by mouth at bedtime as needed for sleep.    Yes Historical Provider, MD  alendronate (FOSAMAX) 70 MG tablet Take 1 tablet (70 mg total) by mouth every 7 (seven) days. Take with a full glass of water on an empty stomach. Patient not taking: Reported on 12/14/2014 12/06/14   Susy Frizzle, MD   BP 154/67 mmHg  Pulse 71  Temp(Src) 98.2 F (36.8 C) (Oral)  Resp 17  SpO2 96% Physical Exam  Constitutional: She is oriented to person, place, and time. She appears well-developed and well-nourished.  HENT:  Head: Normocephalic.  Right Ear: External ear normal.  Left Ear: External ear normal.  Abrasions over L forehead, brow  Eyes: Conjunctivae and EOM are normal. Pupils are equal, round, and reactive to light.  Neck: Normal range of motion. Neck supple.  Cardiovascular: Normal rate, regular rhythm, normal heart sounds and intact distal pulses.   Pulmonary/Chest: Effort normal and breath sounds normal.  Abdominal: Soft. Bowel sounds are normal. There is no tenderness.  Musculoskeletal: Normal range of motion.  Abrasions over L knee, no bony tenderness, FROM  Neurological: She is alert and oriented to person, place, and time.  Skin: Skin is warm and dry.  Vitals reviewed.   ED Course  Procedures (including critical care time) Labs Review Labs Reviewed - No data to display  Imaging Review Ct Head Wo Contrast  12/14/2014   CLINICAL DATA:  80 year old female post fall. No loss of consciousness. Initial encounter.  EXAM: CT HEAD WITHOUT CONTRAST  CT CERVICAL SPINE WITHOUT CONTRAST  TECHNIQUE: Multidetector CT imaging of the head and cervical spine was performed following the standard protocol without intravenous contrast. Multiplanar CT image reconstructions of the cervical spine were also generated.  COMPARISON:  11/16/2014 head CT  FINDINGS: CT HEAD FINDINGS  No skull fracture or intracranial  hemorrhage.  No CT evidence of large acute infarct.  Mild age related atrophy without hydrocephalus.  No intracranial mass lesion noted on this unenhanced exam.  CT CERVICAL SPINE FINDINGS  No cervical spine fracture. Straightening of the cervical spine may be related to head position or spasm.  Cervical spondylotic changes with spinal stenosis and cord flattening most notable C5-6 followed by the C6-7 level. If ligamentous injury or cord injury were of high clinical concern, MR imaging could be obtained for further delineation.  Lung apices clear.  No worrisome neck mass identified.  IMPRESSION: CT HEAD  No skull fracture or intracranial hemorrhage.  CT CERVICAL SPINE  No cervical spine fracture.  Straightening of the cervical spine may be related to head position or spasm.  Cervical spondylotic changes with spinal stenosis and cord flattening most notable C5-6 followed by the C6-7 level. If ligamentous injury or cord injury were of high clinical concern, MR imaging could be obtained for further delineation.   Electronically Signed   By: Alcide Evener.D.  On: 12/14/2014 11:54   Ct Cervical Spine Wo Contrast  12/14/2014   CLINICAL DATA:  80 year old female post fall. No loss of consciousness. Initial encounter.  EXAM: CT HEAD WITHOUT CONTRAST  CT CERVICAL SPINE WITHOUT CONTRAST  TECHNIQUE: Multidetector CT imaging of the head and cervical spine was performed following the standard protocol without intravenous contrast. Multiplanar CT image reconstructions of the cervical spine were also generated.  COMPARISON:  11/16/2014 head CT  FINDINGS: CT HEAD FINDINGS  No skull fracture or intracranial hemorrhage.  No CT evidence of large acute infarct.  Mild age related atrophy without hydrocephalus.  No intracranial mass lesion noted on this unenhanced exam.  CT CERVICAL SPINE FINDINGS  No cervical spine fracture. Straightening of the cervical spine may be related to head position or spasm.  Cervical spondylotic changes  with spinal stenosis and cord flattening most notable C5-6 followed by the C6-7 level. If ligamentous injury or cord injury were of high clinical concern, MR imaging could be obtained for further delineation.  Lung apices clear.  No worrisome neck mass identified.  IMPRESSION: CT HEAD  No skull fracture or intracranial hemorrhage.  CT CERVICAL SPINE  No cervical spine fracture.  Straightening of the cervical spine may be related to head position or spasm.  Cervical spondylotic changes with spinal stenosis and cord flattening most notable C5-6 followed by the C6-7 level. If ligamentous injury or cord injury were of high clinical concern, MR imaging could be obtained for further delineation.   Electronically Signed   By: Genia Del M.D.   On: 12/14/2014 11:54     EKG Interpretation None      MDM   Final diagnoses:  Fall, initial encounter    80 y.o. female with pertinent PMH of dementia, schizophrenia, recurrent falls presents with recurrent fall. She has tenderness over her forehead, otherwise atraumatic. No spinal tenderness, however with headache and history of dementia will obtain cervical spine CT scan. Patient gives a clear etiology of her fall which was that she tripped over bed sheet.   Wu as above unremarkable.  DC home in stable condition.  I have reviewed all laboratory and imaging studies if ordered as above  1. Fall, initial encounter         Debby Freiberg, MD 12/14/14 1209

## 2014-12-14 NOTE — Discharge Instructions (Signed)
Fall Prevention and Home Safety Falls cause injuries and can affect all age groups. It is possible to use preventive measures to significantly decrease the likelihood of falls. There are many simple measures which can make your home safer and prevent falls. OUTDOORS  Repair cracks and edges of walkways and driveways.  Remove high doorway thresholds.  Trim shrubbery on the main path into your home.  Have good outside lighting.  Clear walkways of tools, rocks, debris, and clutter.  Check that handrails are not broken and are securely fastened. Both sides of steps should have handrails.  Have leaves, snow, and ice cleared regularly.  Use sand or salt on walkways during winter months.  In the garage, clean up grease or oil spills. BATHROOM  Install night lights.  Install grab bars by the toilet and in the tub and shower.  Use non-skid mats or decals in the tub or shower.  Place a plastic non-slip stool in the shower to sit on, if needed.  Keep floors dry and clean up all water on the floor immediately.  Remove soap buildup in the tub or shower on a regular basis.  Secure bath mats with non-slip, double-sided rug tape.  Remove throw rugs and tripping hazards from the floors. BEDROOMS  Install night lights.  Make sure a bedside light is easy to reach.  Do not use oversized bedding.  Keep a telephone by your bedside.  Have a firm chair with side arms to use for getting dressed.  Remove throw rugs and tripping hazards from the floor. KITCHEN  Keep handles on pots and pans turned toward the center of the stove. Use back burners when possible.  Clean up spills quickly and allow time for drying.  Avoid walking on wet floors.  Avoid hot utensils and knives.  Position shelves so they are not too high or low.  Place commonly used objects within easy reach.  If necessary, use a sturdy step stool with a grab bar when reaching.  Keep electrical cables out of the  way.  Do not use floor polish or wax that makes floors slippery. If you must use wax, use non-skid floor wax.  Remove throw rugs and tripping hazards from the floor. STAIRWAYS  Never leave objects on stairs.  Place handrails on both sides of stairways and use them. Fix any loose handrails. Make sure handrails on both sides of the stairways are as long as the stairs.  Check carpeting to make sure it is firmly attached along stairs. Make repairs to worn or loose carpet promptly.  Avoid placing throw rugs at the top or bottom of stairways, or properly secure the rug with carpet tape to prevent slippage. Get rid of throw rugs, if possible.  Have an electrician put in a light switch at the top and bottom of the stairs. OTHER FALL PREVENTION TIPS  Wear low-heel or rubber-soled shoes that are supportive and fit well. Wear closed toe shoes.  When using a stepladder, make sure it is fully opened and both spreaders are firmly locked. Do not climb a closed stepladder.  Add color or contrast paint or tape to grab bars and handrails in your home. Place contrasting color strips on first and last steps.  Learn and use mobility aids as needed. Install an electrical emergency response system.  Turn on lights to avoid dark areas. Replace light bulbs that burn out immediately. Get light switches that glow.  Arrange furniture to create clear pathways. Keep furniture in the same place.  Firmly attach carpet with non-skid or double-sided tape. °· Eliminate uneven floor surfaces. °· Select a carpet pattern that does not visually hide the edge of steps. °· Be aware of all pets. °OTHER HOME SAFETY TIPS °· Set the water temperature for 120° F (48.8° C). °· Keep emergency numbers on or near the telephone. °· Keep smoke detectors on every level of the home and near sleeping areas. °Document Released: 04/18/2002 Document Revised: 10/28/2011 Document Reviewed: 07/18/2011 °ExitCare® Patient Information ©2015  [employer]. This information is not intended to replace advice given to you by your health care provider. Make sure you discuss any questions you have with your health care provider. °Contusion °A contusion is a deep bruise. Contusions are the result of an injury that caused bleeding under the skin. The contusion may turn blue, purple, or yellow. Minor injuries will give you a painless contusion, but more severe contusions may stay painful and swollen for a few weeks.  °CAUSES  °A contusion is usually caused by a blow, trauma, or direct force to an area of the body. °SYMPTOMS  °· Swelling and redness of the injured area. °· Bruising of the injured area. °· Tenderness and soreness of the injured area. °· Pain. °DIAGNOSIS  °The diagnosis can be made by taking a history and physical exam. An X-ray, CT scan, or MRI may be needed to determine if there were any associated injuries, such as fractures. °TREATMENT  °Specific treatment will depend on what area of the body was injured. In general, the best treatment for a contusion is resting, icing, elevating, and applying cold compresses to the injured area. Over-the-counter medicines may also be recommended for pain control. Ask your caregiver what the best treatment is for your contusion. °HOME CARE INSTRUCTIONS  °· Put ice on the injured area. °· Put ice in a plastic bag. °· Place a towel between your skin and the bag. °· Leave the ice on for 15-20 minutes, 3-4 times a day, or as directed by your health care provider. °· Only take over-the-counter or prescription medicines for pain, discomfort, or fever as directed by your caregiver. Your caregiver may recommend avoiding anti-inflammatory medicines (aspirin, ibuprofen, and naproxen) for 48 hours because these medicines may increase bruising. °· Rest the injured area. °· If possible, elevate the injured area to reduce swelling. °SEEK IMMEDIATE MEDICAL CARE IF:  °· You have increased bruising or swelling. °· You have  pain that is getting worse. °· Your swelling or pain is not relieved with medicines. °MAKE SURE YOU:  °· Understand these instructions. °· Will watch your condition. °· Will get help right away if you are not doing well or get worse. °Document Released: 02/05/2005 Document Revised: 05/03/2013 Document Reviewed: 03/03/2011 °ExitCare® Patient Information ©2015 [employer]. This information is not intended to replace advice given to you by your health care provider. Make sure you discuss any questions you have with your health care provider. ° °

## 2014-12-14 NOTE — ED Notes (Signed)
Bed: QD64 Expected date:  Expected time:  Means of arrival:  Comments: fall

## 2014-12-18 ENCOUNTER — Ambulatory Visit: Payer: Medicare Other | Admitting: Family Medicine

## 2014-12-19 DIAGNOSIS — F0390 Unspecified dementia without behavioral disturbance: Secondary | ICD-10-CM | POA: Diagnosis not present

## 2014-12-19 DIAGNOSIS — F329 Major depressive disorder, single episode, unspecified: Secondary | ICD-10-CM | POA: Diagnosis not present

## 2014-12-19 DIAGNOSIS — E119 Type 2 diabetes mellitus without complications: Secondary | ICD-10-CM | POA: Diagnosis not present

## 2014-12-19 DIAGNOSIS — M25511 Pain in right shoulder: Secondary | ICD-10-CM | POA: Diagnosis not present

## 2014-12-19 DIAGNOSIS — I10 Essential (primary) hypertension: Secondary | ICD-10-CM | POA: Diagnosis not present

## 2014-12-19 DIAGNOSIS — F209 Schizophrenia, unspecified: Secondary | ICD-10-CM | POA: Diagnosis not present

## 2014-12-21 DIAGNOSIS — F0390 Unspecified dementia without behavioral disturbance: Secondary | ICD-10-CM | POA: Diagnosis not present

## 2014-12-21 DIAGNOSIS — E119 Type 2 diabetes mellitus without complications: Secondary | ICD-10-CM | POA: Diagnosis not present

## 2014-12-21 DIAGNOSIS — M25511 Pain in right shoulder: Secondary | ICD-10-CM | POA: Diagnosis not present

## 2014-12-21 DIAGNOSIS — F329 Major depressive disorder, single episode, unspecified: Secondary | ICD-10-CM | POA: Diagnosis not present

## 2014-12-21 DIAGNOSIS — F209 Schizophrenia, unspecified: Secondary | ICD-10-CM | POA: Diagnosis not present

## 2014-12-21 DIAGNOSIS — I10 Essential (primary) hypertension: Secondary | ICD-10-CM | POA: Diagnosis not present

## 2014-12-25 DIAGNOSIS — I10 Essential (primary) hypertension: Secondary | ICD-10-CM | POA: Diagnosis not present

## 2014-12-25 DIAGNOSIS — M25511 Pain in right shoulder: Secondary | ICD-10-CM | POA: Diagnosis not present

## 2014-12-25 DIAGNOSIS — F329 Major depressive disorder, single episode, unspecified: Secondary | ICD-10-CM | POA: Diagnosis not present

## 2014-12-25 DIAGNOSIS — E119 Type 2 diabetes mellitus without complications: Secondary | ICD-10-CM | POA: Diagnosis not present

## 2014-12-25 DIAGNOSIS — F209 Schizophrenia, unspecified: Secondary | ICD-10-CM | POA: Diagnosis not present

## 2014-12-25 DIAGNOSIS — F0390 Unspecified dementia without behavioral disturbance: Secondary | ICD-10-CM | POA: Diagnosis not present

## 2014-12-26 ENCOUNTER — Encounter: Payer: Self-pay | Admitting: Family Medicine

## 2014-12-26 ENCOUNTER — Ambulatory Visit (INDEPENDENT_AMBULATORY_CARE_PROVIDER_SITE_OTHER): Payer: Medicare Other | Admitting: Family Medicine

## 2014-12-26 VITALS — BP 100/60 | HR 82 | Temp 98.4°F | Resp 18 | Ht 67.0 in | Wt 228.0 lb

## 2014-12-26 DIAGNOSIS — R296 Repeated falls: Secondary | ICD-10-CM | POA: Diagnosis not present

## 2014-12-26 DIAGNOSIS — G2119 Other drug induced secondary parkinsonism: Secondary | ICD-10-CM

## 2014-12-26 NOTE — Progress Notes (Signed)
Subjective:    Patient ID: Bonnie Watkins, female    DOB: [birth year], 80 y.o.   MRN: [id number]  HPI 11/23/14 Patient has not been seen in over a year. She has a past medical history of insulin-dependent diabetes. She states that her blood sugars are always below 200. She denies any hypoglycemia. Her blood pressure is well controlled today 128/58. She denies any chest pain shortness of breath or dyspnea on exertion. Pneumovax 23 and Prevnar 13 are up-to-date. Patient had a mammogram that was normal in March. She is due for a bone density test. Based on her age she does not require colonoscopy or Pap smear. Unfortunately recently she had to go the hospital after she fell and injured her thoracic back as well as struck her occiput. X-rays of the thoracic spine were negative. Head CT was negative. She did sprain her right knee when she fell. Patient states that over the last few weeks she has been feeling more and more unsteady. She also complains of weakness with walking. I ambulated with the patient in the hallway today and she does have a shuffling gait. She also staggers and returned to go in the room. Patient is on several psychiatric medicines. She has no cough or rigidity but she does have a mild resting pill-rolling tremor in her left hand. She is working with a psychiatrist to wean her off her psychiatric medication. However she would like a referral to physical therapy to help her deconditioning and her poor balance which I believe is totally appropriate.  At that time, my plan was: Patient has a history of hyponatremia and so therefore I'll recheck a sodium level to make sure the low sodium did not contribute to her dizziness and falls. Her blood pressures well controlled today. I will check a TSH to make sure that her levothyroxine is in the therapeutic range. I will also check a hemoglobin A1c and a urine microalbumin to monitor the management of her diabetes. Her immunizations are  up-to-date. I will schedule the patient for physical therapy. I'll also schedule her for a bone density test.  12/26/14 In just the last month since I last saw the patient, she has fallen on 3 separate occasions. One she was walking in the hall with a cane and became tripped over her feet which seemed to be dragging and fell. The second time she was in the room and she became tangled up in her sheets that she tried to get out of bed and fell the third time she was ambulating in her room and became tripped over her own feet and fell. Patient has suffered injuries including a contusion to the for head and a sprain of her right wrist. She has had to go to the emergency room with a done head CTs as well as neck CTs. There are no significant abnormalities although she did have cervical spinal stenosis found coincidentally on the CT of the neck. Examination today reveals cranial nerves II through XII are grossly intact muscle strength is 5 over 5 equal and symmetric in the upper and lower extremities. The patient has normal reflexes and normal sensation. There is no significant neurologic deficit detected on examination. However she does have a pill-rolling tremor in her hand. She does have a shuffling gait and a masklike facies consistent with drug-induced parkinsonism. She is on perphenazine as well as Seroquel. She is on benztropine to help mitigate the side effects of these medications Past Medical History  Diagnosis  Date  . Diabetes mellitus without complication   . Schizophrenia   . Hypertension   . Dementia   . Depression   . GERD (gastroesophageal reflux disease)   . ACE inhibitor-aggravated angioedema   . Diverticulosis    No past surgical history on file. Current Outpatient Prescriptions on File Prior to Visit  Medication Sig Dispense Refill  . acetaminophen (TYLENOL) 500 MG tablet Take 500 mg by mouth every 6 (six) hours as needed for mild pain.    Marland Kitchen alendronate (FOSAMAX) 70 MG tablet Take 1  tablet (70 mg total) by mouth every 7 (seven) days. Take with a full glass of water on an empty stomach. 12 tablet 3  . aspirin EC 81 MG tablet Take 81 mg by mouth daily.    . benztropine (COGENTIN) 1 MG tablet Take 1 mg by mouth 2 (two) times daily.    . calcium carbonate (TUMS - DOSED IN MG ELEMENTAL CALCIUM) 500 MG chewable tablet Chew 1 tablet by mouth 3 (three) times daily as needed for indigestion or heartburn.    . citalopram (CELEXA) 20 MG tablet Take 20 mg by mouth daily.    Marland Kitchen docusate sodium (COLACE) 250 MG capsule 2 caps po bid (Patient taking differently: 2 caps twice daily as needed for constipation.) 120 capsule 11  . hydrochlorothiazide (HYDRODIURIL) 25 MG tablet Take 25 mg by mouth daily.    . insulin glargine (LANTUS) 100 UNIT/ML injection Inject 12 Units into the skin every morning.    Marland Kitchen levothyroxine (SYNTHROID, LEVOTHROID) 50 MCG tablet Take 50 mcg by mouth daily before breakfast.    . memantine (NAMENDA) 10 MG tablet Take 1 tablet (10 mg total) by mouth 2 (two) times daily. 60 tablet 2  . metFORMIN (GLUCOPHAGE) 1000 MG tablet Take 1,000 mg by mouth 2 (two) times daily.    Marland Kitchen omeprazole (PRILOSEC) 40 MG capsule TAKE (1) CAPSULE BY MOUTH ONCE DAILY. (Patient taking differently: TAKE 40 MG BY MOUTH ONCE DAILY) 30 capsule 3  . ondansetron (ZOFRAN ODT) 8 MG disintegrating tablet Take 1 tablet (8 mg total) by mouth every 8 (eight) hours as needed for nausea. 20 tablet 0  . perphenazine (TRILAFON) 4 MG tablet Take 6 mg by mouth every morning.     . polyethylene glycol (MIRALAX / GLYCOLAX) packet Take 17 g by mouth daily as needed for mild constipation or moderate constipation. Mix with 8 oz of liquid    . QUEtiapine (SEROQUEL) 200 MG tablet Take 200 mg by mouth at bedtime.    . traMADol (ULTRAM) 50 MG tablet TAKE (1) TABLET BY MOUTH TWICE DAILY. (Patient taking differently: TAKE 50 MG BY MOUTH TWICE DAILY.) 60 tablet 2  . traZODone (DESYREL) 150 MG tablet Take 150 mg by mouth at bedtime  as needed for sleep.      No current facility-administered medications on file prior to visit.   Allergies  Allergen Reactions  . Shellfish Allergy Anaphylaxis    ALL SEAFOOD ALLERGY  . Ace Inhibitors Other (See Comments)    On MAR  . Angiotensin Receptor Blockers Other (See Comments)    ON MAR  . Lisinopril     Angioedema  . Strawberry Hives  . Robaxin [Methocarbamol] Rash   Social History   Social History  . Marital Status: Divorced    Spouse Name: N/A  . Number of Children: N/A  . Years of Education: N/A   Occupational History  . Not on file.   Social History Main Topics  .  Smoking status: Former Research scientist (life sciences)  . Smokeless tobacco: Not on file  . Alcohol Use: No  . Drug Use: No  . Sexual Activity: No     Comment: lives at [facility].     Other Topics Concern  . Not on file   Social History Narrative     Review of Systems  All other systems reviewed and are negative.      Objective:   Physical Exam  Constitutional: She appears well-developed and well-nourished.  Cardiovascular: Normal rate, regular rhythm and normal heart sounds.   No murmur heard. Pulmonary/Chest: Effort normal and breath sounds normal. No respiratory distress. She has no wheezes. She has no rales.  Abdominal: Soft. Bowel sounds are normal. She exhibits no distension. There is no tenderness. There is no rebound and no guarding.  Musculoskeletal: She exhibits no edema.  Skin: No rash noted.  Vitals reviewed.  [phone] dr. lay       Assessment & Plan:  Frequent falls - Plan: MR Brain W Wo Contrast  Drug-induced Parkinsonism - Plan: MR Brain W Wo Contrast  I believe the patient's falls are multifactorial. There is certainly some age-related deconditioning. The patient is currently working with physical therapy to help improve the situation. I'm also concerned about her drug-induced parkinsonism related to her anti-psychotic medication. In the past she has tried to wean off  perphenazine unsuccessfully as she experienced delusions and paranoia. I would like to speak with her psychiatrist, Dr. Hoyle Barr about possibly weaning the patient down on Seroquel to see if we can help prevent future falls. I will also schedule the patient for an MRI of the brain to exclude a subtle stroke in her cerebellum and she does have a staggering gait in addition to a shuffling gait. She staggers into walls as we walk together in the hallway.

## 2014-12-27 DIAGNOSIS — I10 Essential (primary) hypertension: Secondary | ICD-10-CM | POA: Diagnosis not present

## 2014-12-27 DIAGNOSIS — F329 Major depressive disorder, single episode, unspecified: Secondary | ICD-10-CM | POA: Diagnosis not present

## 2014-12-27 DIAGNOSIS — F0390 Unspecified dementia without behavioral disturbance: Secondary | ICD-10-CM | POA: Diagnosis not present

## 2014-12-27 DIAGNOSIS — M25511 Pain in right shoulder: Secondary | ICD-10-CM | POA: Diagnosis not present

## 2014-12-27 DIAGNOSIS — F209 Schizophrenia, unspecified: Secondary | ICD-10-CM | POA: Diagnosis not present

## 2014-12-27 DIAGNOSIS — E119 Type 2 diabetes mellitus without complications: Secondary | ICD-10-CM | POA: Diagnosis not present

## 2015-01-03 DIAGNOSIS — E119 Type 2 diabetes mellitus without complications: Secondary | ICD-10-CM | POA: Diagnosis not present

## 2015-01-03 DIAGNOSIS — F329 Major depressive disorder, single episode, unspecified: Secondary | ICD-10-CM | POA: Diagnosis not present

## 2015-01-03 DIAGNOSIS — F0390 Unspecified dementia without behavioral disturbance: Secondary | ICD-10-CM | POA: Diagnosis not present

## 2015-01-03 DIAGNOSIS — M25511 Pain in right shoulder: Secondary | ICD-10-CM | POA: Diagnosis not present

## 2015-01-03 DIAGNOSIS — F209 Schizophrenia, unspecified: Secondary | ICD-10-CM | POA: Diagnosis not present

## 2015-01-03 DIAGNOSIS — I10 Essential (primary) hypertension: Secondary | ICD-10-CM | POA: Diagnosis not present

## 2015-01-05 DIAGNOSIS — M25511 Pain in right shoulder: Secondary | ICD-10-CM | POA: Diagnosis not present

## 2015-01-05 DIAGNOSIS — F329 Major depressive disorder, single episode, unspecified: Secondary | ICD-10-CM | POA: Diagnosis not present

## 2015-01-05 DIAGNOSIS — F209 Schizophrenia, unspecified: Secondary | ICD-10-CM | POA: Diagnosis not present

## 2015-01-05 DIAGNOSIS — E119 Type 2 diabetes mellitus without complications: Secondary | ICD-10-CM | POA: Diagnosis not present

## 2015-01-05 DIAGNOSIS — I10 Essential (primary) hypertension: Secondary | ICD-10-CM | POA: Diagnosis not present

## 2015-01-05 DIAGNOSIS — F0390 Unspecified dementia without behavioral disturbance: Secondary | ICD-10-CM | POA: Diagnosis not present

## 2015-01-08 DIAGNOSIS — M25511 Pain in right shoulder: Secondary | ICD-10-CM | POA: Diagnosis not present

## 2015-01-08 DIAGNOSIS — E119 Type 2 diabetes mellitus without complications: Secondary | ICD-10-CM | POA: Diagnosis not present

## 2015-01-08 DIAGNOSIS — F209 Schizophrenia, unspecified: Secondary | ICD-10-CM | POA: Diagnosis not present

## 2015-01-08 DIAGNOSIS — F329 Major depressive disorder, single episode, unspecified: Secondary | ICD-10-CM | POA: Diagnosis not present

## 2015-01-08 DIAGNOSIS — F0390 Unspecified dementia without behavioral disturbance: Secondary | ICD-10-CM | POA: Diagnosis not present

## 2015-01-08 DIAGNOSIS — I10 Essential (primary) hypertension: Secondary | ICD-10-CM | POA: Diagnosis not present

## 2015-01-09 DIAGNOSIS — F209 Schizophrenia, unspecified: Secondary | ICD-10-CM | POA: Diagnosis not present

## 2015-01-09 DIAGNOSIS — F329 Major depressive disorder, single episode, unspecified: Secondary | ICD-10-CM | POA: Diagnosis not present

## 2015-01-09 DIAGNOSIS — I10 Essential (primary) hypertension: Secondary | ICD-10-CM | POA: Diagnosis not present

## 2015-01-10 DIAGNOSIS — F329 Major depressive disorder, single episode, unspecified: Secondary | ICD-10-CM | POA: Diagnosis not present

## 2015-01-10 DIAGNOSIS — E119 Type 2 diabetes mellitus without complications: Secondary | ICD-10-CM | POA: Diagnosis not present

## 2015-01-10 DIAGNOSIS — F0390 Unspecified dementia without behavioral disturbance: Secondary | ICD-10-CM | POA: Diagnosis not present

## 2015-01-10 DIAGNOSIS — M25511 Pain in right shoulder: Secondary | ICD-10-CM | POA: Diagnosis not present

## 2015-01-10 DIAGNOSIS — F209 Schizophrenia, unspecified: Secondary | ICD-10-CM | POA: Diagnosis not present

## 2015-01-10 DIAGNOSIS — I10 Essential (primary) hypertension: Secondary | ICD-10-CM | POA: Diagnosis not present

## 2015-01-17 DIAGNOSIS — E119 Type 2 diabetes mellitus without complications: Secondary | ICD-10-CM | POA: Diagnosis not present

## 2015-01-17 DIAGNOSIS — F209 Schizophrenia, unspecified: Secondary | ICD-10-CM | POA: Diagnosis not present

## 2015-01-17 DIAGNOSIS — M25511 Pain in right shoulder: Secondary | ICD-10-CM | POA: Diagnosis not present

## 2015-01-17 DIAGNOSIS — I10 Essential (primary) hypertension: Secondary | ICD-10-CM | POA: Diagnosis not present

## 2015-01-17 DIAGNOSIS — F329 Major depressive disorder, single episode, unspecified: Secondary | ICD-10-CM | POA: Diagnosis not present

## 2015-01-17 DIAGNOSIS — F0390 Unspecified dementia without behavioral disturbance: Secondary | ICD-10-CM | POA: Diagnosis not present

## 2015-01-19 ENCOUNTER — Ambulatory Visit
Admission: RE | Admit: 2015-01-19 | Discharge: 2015-01-19 | Disposition: A | Discharge status: Home / Self Care | Payer: Medicare Other | Source: Ambulatory Visit | Attending: Family Medicine | Admitting: Family Medicine

## 2015-01-19 DIAGNOSIS — R296 Repeated falls: Secondary | ICD-10-CM

## 2015-01-19 DIAGNOSIS — G2119 Other drug induced secondary parkinsonism: Secondary | ICD-10-CM

## 2015-01-19 MED ORDER — GADOBENATE DIMEGLUMINE 529 MG/ML IV SOLN
20.0000 mL | Freq: Once | INTRAVENOUS | Status: AC | PRN
  Administered 2015-01-19: 20 mL via INTRAVENOUS

## 2015-01-22 DIAGNOSIS — E119 Type 2 diabetes mellitus without complications: Secondary | ICD-10-CM | POA: Diagnosis not present

## 2015-01-22 DIAGNOSIS — I10 Essential (primary) hypertension: Secondary | ICD-10-CM | POA: Diagnosis not present

## 2015-01-22 DIAGNOSIS — F329 Major depressive disorder, single episode, unspecified: Secondary | ICD-10-CM | POA: Diagnosis not present

## 2015-01-22 DIAGNOSIS — F0390 Unspecified dementia without behavioral disturbance: Secondary | ICD-10-CM | POA: Diagnosis not present

## 2015-01-22 DIAGNOSIS — M25511 Pain in right shoulder: Secondary | ICD-10-CM | POA: Diagnosis not present

## 2015-01-22 DIAGNOSIS — F209 Schizophrenia, unspecified: Secondary | ICD-10-CM | POA: Diagnosis not present

## 2015-01-23 ENCOUNTER — Other Ambulatory Visit: Payer: Self-pay | Admitting: Family Medicine

## 2015-01-23 MED ORDER — QUETIAPINE FUMARATE 100 MG PO TABS: 100.0000 mg | ORAL_TABLET | Freq: Every day | ORAL | Status: DC

## 2015-01-24 DIAGNOSIS — I10 Essential (primary) hypertension: Secondary | ICD-10-CM | POA: Diagnosis not present

## 2015-01-24 DIAGNOSIS — F329 Major depressive disorder, single episode, unspecified: Secondary | ICD-10-CM | POA: Diagnosis not present

## 2015-01-24 DIAGNOSIS — E119 Type 2 diabetes mellitus without complications: Secondary | ICD-10-CM | POA: Diagnosis not present

## 2015-01-24 DIAGNOSIS — M25511 Pain in right shoulder: Secondary | ICD-10-CM | POA: Diagnosis not present

## 2015-01-24 DIAGNOSIS — F0390 Unspecified dementia without behavioral disturbance: Secondary | ICD-10-CM | POA: Diagnosis not present

## 2015-01-24 DIAGNOSIS — F209 Schizophrenia, unspecified: Secondary | ICD-10-CM | POA: Diagnosis not present

## 2015-02-02 DIAGNOSIS — Z23 Encounter for immunization: Secondary | ICD-10-CM | POA: Diagnosis not present

## 2015-02-13 ENCOUNTER — Encounter: Payer: Self-pay | Admitting: Family Medicine

## 2015-02-14 ENCOUNTER — Telehealth: Payer: Self-pay | Admitting: Family Medicine

## 2015-02-14 NOTE — Telephone Encounter (Signed)
Submitted PA through [email or web address] Josph Macho

## 2015-02-15 DIAGNOSIS — H2513 Age-related nuclear cataract, bilateral: Secondary | ICD-10-CM | POA: Diagnosis not present

## 2015-02-15 DIAGNOSIS — H40013 Open angle with borderline findings, low risk, bilateral: Secondary | ICD-10-CM | POA: Diagnosis not present

## 2015-02-15 DIAGNOSIS — E119 Type 2 diabetes mellitus without complications: Secondary | ICD-10-CM | POA: Diagnosis not present

## 2015-02-15 DIAGNOSIS — H25013 Cortical age-related cataract, bilateral: Secondary | ICD-10-CM | POA: Diagnosis not present

## 2015-02-15 LAB — HM DIABETES EYE EXAM

## 2015-02-16 ENCOUNTER — Encounter: Payer: Self-pay | Admitting: Family Medicine

## 2015-03-05 DIAGNOSIS — F251 Schizoaffective disorder, depressive type: Secondary | ICD-10-CM | POA: Diagnosis not present

## 2015-03-30 ENCOUNTER — Other Ambulatory Visit: Payer: Self-pay | Admitting: Family Medicine

## 2015-03-30 NOTE — Telephone Encounter (Signed)
Refill appropriate and filled per protocol. 

## 2015-04-13 ENCOUNTER — Telehealth: Payer: Self-pay | Admitting: *Deleted

## 2015-04-13 NOTE — Telephone Encounter (Signed)
Received fax from pharmacy.   Requested order to D/C PRN medications that patient has not used in >90 days: Chewable Antacid Miralax Tylenol  MD reviewed and approved order. Order faxed to facility.

## 2015-04-23 ENCOUNTER — Other Ambulatory Visit: Payer: Self-pay | Admitting: Family Medicine

## 2015-04-23 NOTE — Telephone Encounter (Signed)
Medication refilled per protocol. 

## 2015-05-21 ENCOUNTER — Other Ambulatory Visit: Payer: Self-pay | Admitting: Family Medicine

## 2015-05-23 ENCOUNTER — Other Ambulatory Visit: Payer: Self-pay | Admitting: Family Medicine

## 2015-05-28 ENCOUNTER — Other Ambulatory Visit: Payer: Self-pay | Admitting: Family Medicine

## 2015-05-28 NOTE — Telephone Encounter (Signed)
Refill appropriate and filled per protocol. 

## 2015-05-28 NOTE — Telephone Encounter (Signed)
Medication refilled per protocol. 

## 2015-05-29 ENCOUNTER — Other Ambulatory Visit: Payer: Self-pay | Admitting: Family Medicine

## 2015-05-29 NOTE — Telephone Encounter (Signed)
Medication called to pharmacy. 

## 2015-05-29 NOTE — Telephone Encounter (Signed)
ok 

## 2015-05-29 NOTE — Telephone Encounter (Signed)
Ok to refill??  Last office visit 12/26/2014.   Last refill 11/02/2014, #2 refills.

## 2015-06-25 ENCOUNTER — Other Ambulatory Visit: Payer: Self-pay | Admitting: Family Medicine

## 2015-06-25 DIAGNOSIS — F251 Schizoaffective disorder, depressive type: Secondary | ICD-10-CM | POA: Diagnosis not present

## 2015-06-26 NOTE — Telephone Encounter (Signed)
Refill appropriate and filled per protocol. 

## 2015-06-27 DIAGNOSIS — H40013 Open angle with borderline findings, low risk, bilateral: Secondary | ICD-10-CM | POA: Diagnosis not present

## 2015-07-12 ENCOUNTER — Other Ambulatory Visit: Payer: Self-pay | Admitting: *Deleted

## 2015-07-12 MED ORDER — CALCIUM CITRATE-VITAMIN D 250-100 MG-UNIT PO TABS: 2.0000 | ORAL_TABLET | Freq: Two times a day (BID) | ORAL | Status: DC

## 2015-07-12 NOTE — Telephone Encounter (Signed)
Received fax from facility with pharmacy recommendations.   Reports that patient is taking Fosamax for Osteoporosis prevention, but she is not taking calcium or Vit D.   Requested order for calcium citrate 200/250 (2) tabs PO BID.   MD reviewed and approved.   Order faxed to facility.   Medication list updated.

## 2015-07-18 ENCOUNTER — Telehealth: Payer: Self-pay | Admitting: Family Medicine

## 2015-07-18 ENCOUNTER — Other Ambulatory Visit: Payer: Self-pay | Admitting: Family Medicine

## 2015-07-18 NOTE — Telephone Encounter (Signed)
Daughter is wanting to get her a rolling walker with a seat d/t her weakness and shaking  - order faxed to [employer].

## 2015-08-13 ENCOUNTER — Other Ambulatory Visit: Payer: Self-pay | Admitting: Family Medicine

## 2015-08-13 MED ORDER — CALCIUM CITRATE-VITAMIN D 200-250 MG-UNIT PO TABS: 2.0000 | ORAL_TABLET | Freq: Two times a day (BID) | ORAL | Status: DC

## 2015-08-13 NOTE — Telephone Encounter (Signed)
Medication called/sent to requested pharmacy  

## 2015-10-15 DIAGNOSIS — F251 Schizoaffective disorder, depressive type: Secondary | ICD-10-CM | POA: Diagnosis not present

## 2015-10-23 ENCOUNTER — Encounter: Payer: Self-pay | Admitting: Family Medicine

## 2015-10-23 ENCOUNTER — Ambulatory Visit (INDEPENDENT_AMBULATORY_CARE_PROVIDER_SITE_OTHER): Payer: Medicare Other | Admitting: Family Medicine

## 2015-10-23 VITALS — BP 160/74 | HR 84 | Temp 98.2°F | Resp 20 | Ht 67.0 in | Wt 244.0 lb

## 2015-10-23 DIAGNOSIS — J208 Acute bronchitis due to other specified organisms: Secondary | ICD-10-CM | POA: Diagnosis not present

## 2015-10-23 MED ORDER — DOXYCYCLINE HYCLATE 100 MG PO TABS: 100.0000 mg | ORAL_TABLET | Freq: Two times a day (BID) | ORAL | Status: DC

## 2015-10-23 NOTE — Progress Notes (Signed)
Subjective:    Patient ID: Bonnie Watkins, female    DOB: June 18, 1934, 81 y.o.   MRN: [id number]  HPI  Has had a cough for 1 week. Cough is productive of yellow mucus. She has thick chest congestion and some mild shortness of breath. On examination today she has rhonchorous breath sounds. She denies any fevers. She denies any hemoptysis. She does have some mild pleurisy Past Medical History  Diagnosis Date  . Diabetes mellitus without complication ([place])   . Schizophrenia ([place])   . Hypertension   . Dementia   . Depression   . GERD (gastroesophageal reflux disease)   . ACE inhibitor-aggravated angioedema   . Diverticulosis   . Osteoporosis    No past surgical history on file. Current Outpatient Prescriptions on File Prior to Visit  Medication Sig Dispense Refill  . alendronate (FOSAMAX) 70 MG tablet TAKE 1 TAB EACH WEEK 30 MIN PRIOR TO BREAKFAST WITH LARGE GLASS OF WATER. REMAIN UPRIGHT. 4 tablet 5  . ASPIRIN LOW DOSE 81 MG EC tablet TAKE ONE TABLET BY MOUTH ONCE DAILY. 30 tablet 11  . benztropine (COGENTIN) 1 MG tablet Take 1 mg by mouth 2 (two) times daily.    . Calcium Citrate-Vitamin D 200-250 MG-UNIT TABS Take 2 tablets by mouth 2 (two) times daily. 120 tablet 11  . calcium-vitamin D 250-100 MG-UNIT tablet Take 2 tablets by mouth 2 (two) times daily.    . citalopram (CELEXA) 20 MG tablet Take 20 mg by mouth daily.    Marland Kitchen docusate sodium (COLACE) 250 MG capsule 2 caps po bid (Patient taking differently: 2 caps twice daily as needed for constipation.) 120 capsule 11  . hydrochlorothiazide (HYDRODIURIL) 25 MG tablet TAKE ONE TABLET BY MOUTH ONCE DAILY. 30 tablet 11  . insulin glargine (LANTUS) 100 UNIT/ML injection Inject 12 Units into the skin every morning.    Marland Kitchen levothyroxine (SYNTHROID, LEVOTHROID) 50 MCG tablet TAKE ONE TABLET BY MOUTH ONCE DAILY. 30 tablet 5  . memantine (NAMENDA) 10 MG tablet Take 1 tablet (10 mg total) by mouth 2 (two) times daily. 60 tablet 2  .  metFORMIN (GLUCOPHAGE) 1000 MG tablet TAKE (1) TABLET BY MOUTH TWICE DAILY. 60 tablet 0  . omeprazole (PRILOSEC) 40 MG capsule TAKE (1) CAPSULE BY MOUTH ONCE DAILY. 30 capsule 0  . ondansetron (ZOFRAN ODT) 8 MG disintegrating tablet Take 1 tablet (8 mg total) by mouth every 8 (eight) hours as needed for nausea. 20 tablet 0  . perphenazine (TRILAFON) 4 MG tablet Take 6 mg by mouth every morning.     Marland Kitchen QUEtiapine (SEROQUEL) 100 MG tablet Take 1 tablet (100 mg total) by mouth at bedtime. 30 tablet 5  . traMADol (ULTRAM) 50 MG tablet TAKE (1) TABLET BY MOUTH TWICE DAILY. 60 tablet 2  . traZODone (DESYREL) 150 MG tablet Take 150 mg by mouth at bedtime as needed for sleep.      No current facility-administered medications on file prior to visit.   Allergies  Allergen Reactions  . Shellfish Allergy Anaphylaxis    ALL SEAFOOD ALLERGY  . Ace Inhibitors Other (See Comments)    On MAR  . Angiotensin Receptor Blockers Other (See Comments)    ON MAR  . Lisinopril     Angioedema  . Strawberry Extract Hives  . Robaxin [Methocarbamol] Rash   Social History   Social History  . Marital Status: Divorced    Spouse Name: N/A  . Number of Children: N/A  . Years  of Education: N/A   Occupational History  . Not on file.   Social History Main Topics  . Smoking status: Former Research scientist (life sciences)  . Smokeless tobacco: Not on file  . Alcohol Use: No  . Drug Use: No  . Sexual Activity: No     Comment: lives at [facility].     Other Topics Concern  . Not on file   Social History Narrative     Review of Systems  All other systems reviewed and are negative.      Objective:   Physical Exam  HENT:  Right Ear: External ear normal.  Left Ear: External ear normal.  Nose: Nose normal.  Mouth/Throat: Oropharynx is clear and moist. No oropharyngeal exudate.  Neck: Neck supple.  Cardiovascular: Normal rate, regular rhythm and normal heart sounds.   Pulmonary/Chest: Effort normal. No respiratory  distress. She has no wheezes. She has no rales.  Abdominal: Soft. Bowel sounds are normal. She exhibits no distension and no mass. There is no tenderness. There is no rebound and no guarding.  Lymphadenopathy:    She has no cervical adenopathy.  Vitals reviewed.         Assessment & Plan:  Acute bronchitis due to other specified organisms - Plan: doxycycline (VIBRA-TABS) 100 MG tablet  Patient appears to have bronchitis. Given her advanced age and multiple medical comorbidities, I will start the patient on Zithromax 500 mg by mouth daily one, 250 mg by mouth daily 235. Use Mucinex 600 mg every 12 hours when necessary cough. Recheck in 48 hours if no better or sooner if worse.

## 2015-11-26 ENCOUNTER — Other Ambulatory Visit: Payer: Self-pay | Admitting: Family Medicine

## 2015-11-26 NOTE — Telephone Encounter (Signed)
ok 

## 2015-11-26 NOTE — Telephone Encounter (Signed)
Ok to refill 

## 2015-12-06 ENCOUNTER — Other Ambulatory Visit: Payer: Self-pay | Admitting: Family Medicine

## 2015-12-06 NOTE — Telephone Encounter (Signed)
Refill appropriate and filled per protocol. 

## 2015-12-07 ENCOUNTER — Other Ambulatory Visit: Payer: Self-pay | Admitting: *Deleted

## 2015-12-07 MED ORDER — QUETIAPINE FUMARATE 100 MG PO TABS: 100.0000 mg | ORAL_TABLET | Freq: Every day | ORAL | 5 refills | Status: AC

## 2015-12-07 NOTE — Telephone Encounter (Signed)
Received fax requesting refill on Seroquel.   Refill appropriate and filled per protocol.

## 2015-12-13 ENCOUNTER — Other Ambulatory Visit: Payer: Self-pay | Admitting: Family Medicine

## 2015-12-13 NOTE — Telephone Encounter (Signed)
Refill appropriate and filled per protocol. 

## 2015-12-14 ENCOUNTER — Other Ambulatory Visit: Payer: Self-pay | Admitting: Family Medicine

## 2015-12-14 NOTE — Telephone Encounter (Signed)
Refill appropriate and filled per protocol. 

## 2015-12-17 ENCOUNTER — Other Ambulatory Visit: Payer: Self-pay | Admitting: Family Medicine

## 2016-01-09 ENCOUNTER — Other Ambulatory Visit: Payer: Self-pay

## 2016-01-24 DIAGNOSIS — Z23 Encounter for immunization: Secondary | ICD-10-CM | POA: Diagnosis not present

## 2016-02-04 DIAGNOSIS — F251 Schizoaffective disorder, depressive type: Secondary | ICD-10-CM | POA: Diagnosis not present

## 2016-02-27 ENCOUNTER — Telehealth: Payer: Self-pay | Admitting: Family Medicine

## 2016-02-27 NOTE — Telephone Encounter (Signed)
Bonnie Watkins called from [facility] and states that the pt is having increased muscle spasms and she is taking Tramadol BID but they would like to know if we can call something else in for her to use prn???  CB# [phone] Fax# - [phone]

## 2016-02-28 ENCOUNTER — Encounter: Payer: Self-pay | Admitting: Family Medicine

## 2016-02-28 DIAGNOSIS — H25013 Cortical age-related cataract, bilateral: Secondary | ICD-10-CM | POA: Diagnosis not present

## 2016-02-28 DIAGNOSIS — H25011 Cortical age-related cataract, right eye: Secondary | ICD-10-CM | POA: Diagnosis not present

## 2016-02-28 DIAGNOSIS — H2511 Age-related nuclear cataract, right eye: Secondary | ICD-10-CM | POA: Diagnosis not present

## 2016-02-28 DIAGNOSIS — H2513 Age-related nuclear cataract, bilateral: Secondary | ICD-10-CM | POA: Diagnosis not present

## 2016-02-28 DIAGNOSIS — H40013 Open angle with borderline findings, low risk, bilateral: Secondary | ICD-10-CM | POA: Diagnosis not present

## 2016-02-28 LAB — HM DIABETES EYE EXAM

## 2016-02-28 NOTE — Telephone Encounter (Signed)
Flexeril 10 tid prn but ntbs if getting worse.

## 2016-02-28 NOTE — Telephone Encounter (Signed)
Orders were faxed yesterday.

## 2016-04-15 DIAGNOSIS — H25811 Combined forms of age-related cataract, right eye: Secondary | ICD-10-CM | POA: Diagnosis not present

## 2016-04-15 DIAGNOSIS — H2511 Age-related nuclear cataract, right eye: Secondary | ICD-10-CM | POA: Diagnosis not present

## 2016-05-07 DIAGNOSIS — H25012 Cortical age-related cataract, left eye: Secondary | ICD-10-CM | POA: Diagnosis not present

## 2016-05-07 DIAGNOSIS — H2512 Age-related nuclear cataract, left eye: Secondary | ICD-10-CM | POA: Diagnosis not present

## 2016-05-13 DIAGNOSIS — H2512 Age-related nuclear cataract, left eye: Secondary | ICD-10-CM | POA: Diagnosis not present

## 2016-05-13 DIAGNOSIS — H25812 Combined forms of age-related cataract, left eye: Secondary | ICD-10-CM | POA: Diagnosis not present

## 2016-05-13 DIAGNOSIS — H25012 Cortical age-related cataract, left eye: Secondary | ICD-10-CM | POA: Diagnosis not present

## 2016-07-15 ENCOUNTER — Encounter: Payer: Self-pay | Admitting: Family Medicine

## 2016-07-15 ENCOUNTER — Ambulatory Visit (INDEPENDENT_AMBULATORY_CARE_PROVIDER_SITE_OTHER): Payer: Medicare Other | Admitting: Family Medicine

## 2016-07-15 VITALS — BP 156/80 | HR 80 | Temp 98.4°F | Resp 18 | Ht 67.0 in | Wt 236.0 lb

## 2016-07-15 DIAGNOSIS — R0602 Shortness of breath: Secondary | ICD-10-CM

## 2016-07-15 NOTE — Progress Notes (Signed)
Subjective:    Patient ID: Bonnie Watkins, female    DOB: [birth year], 82 y.o.   MRN: [id number]  HPI  Patient is an 82 year old Hispanic female who presents today with 2 weeks of shortness of breath. She is accounted by her daughter. Past medical history is complicated by schizophrenia and mild dementia. She lives in a group home. She's been calling her daughter every morning stating that she is short of breath as soon as she wakes up. It time she'll be gasping for air. As soon as her nurses at her nursing facility administered her medications, her symptoms immediately improved. Later in the day she is able to exercise without any chest pain or shortness of breath. This always seems to occur in the morning and improves spontaneously after she gets her morning medications at 10 AM. She denies any angina with activity. She denies any shortness of breath with activity. She denies any orthopnea or cough or pleurisy or hemoptysis. Symptoms sound like anxiety. EKG today shows normal sinus rhythm with nonspecific ST changes in the leads V4, V5, and V6. There is no overt ischemia or infarction Past Medical History:  Diagnosis Date  . ACE inhibitor-aggravated angioedema   . Dementia   . Depression   . Diabetes mellitus without complication ([place])   . Diverticulosis   . GERD (gastroesophageal reflux disease)   . Hypertension   . Osteoporosis   . Schizophrenia ([place])    No past surgical history on file. Current Outpatient Prescriptions on File Prior to Visit  Medication Sig Dispense Refill  . alendronate (FOSAMAX) 70 MG tablet TAKE 1 TAB EACH WEEK 30 MIN PRIOR TO BREAKFAST WITH LARGE GLASS OF WATER. REMAIN UPRIGHT. 4 tablet 0  . ASPIRIN LOW DOSE 81 MG EC tablet TAKE ONE TABLET BY MOUTH ONCE DAILY. 30 tablet 11  . benztropine (COGENTIN) 1 MG tablet Take 1 mg by mouth 2 (two) times daily.    . Calcium Citrate-Vitamin D 200-250 MG-UNIT TABS Take 2 tablets by mouth 2 (two) times daily. 120  tablet 11  . calcium-vitamin D 250-100 MG-UNIT tablet Take 2 tablets by mouth 2 (two) times daily.    . citalopram (CELEXA) 20 MG tablet Take 20 mg by mouth daily.    Marland Kitchen docusate sodium (COLACE) 250 MG capsule 2 caps po bid (Patient taking differently: 2 caps twice daily as needed for constipation.) 120 capsule 11  . hydrochlorothiazide (HYDRODIURIL) 25 MG tablet TAKE ONE TABLET BY MOUTH ONCE DAILY. 30 tablet 11  . insulin glargine (LANTUS) 100 UNIT/ML injection Inject 12 Units into the skin every morning.    Marland Kitchen levothyroxine (SYNTHROID, LEVOTHROID) 50 MCG tablet TAKE ONE TABLET BY MOUTH ONCE DAILY. 30 tablet 0  . memantine (NAMENDA) 10 MG tablet Take 1 tablet (10 mg total) by mouth 2 (two) times daily. 60 tablet 2  . metFORMIN (GLUCOPHAGE) 1000 MG tablet TAKE (1) TABLET BY MOUTH TWICE DAILY. 60 tablet 0  . omeprazole (PRILOSEC) 40 MG capsule TAKE (1) CAPSULE BY MOUTH ONCE DAILY. 30 capsule 11  . ondansetron (ZOFRAN ODT) 8 MG disintegrating tablet Take 1 tablet (8 mg total) by mouth every 8 (eight) hours as needed for nausea. 20 tablet 0  . perphenazine (TRILAFON) 4 MG tablet Take 6 mg by mouth every morning.     Marland Kitchen QUEtiapine (SEROQUEL) 100 MG tablet Take 1 tablet (100 mg total) by mouth at bedtime. 30 tablet 5  . traMADol (ULTRAM) 50 MG tablet TAKE (1) TABLET BY MOUTH TWICE  DAILY. 60 tablet 1  . traZODone (DESYREL) 150 MG tablet Take 150 mg by mouth at bedtime as needed for sleep.      No current facility-administered medications on file prior to visit.    Allergies  Allergen Reactions  . Shellfish Allergy Anaphylaxis    ALL SEAFOOD ALLERGY  . Ace Inhibitors Other (See Comments)    On MAR  . Angiotensin Receptor Blockers Other (See Comments)    ON MAR  . Lisinopril     Angioedema  . Strawberry Extract Hives  . Robaxin [Methocarbamol] Rash   Social History   Social History  . Marital status: Divorced    Spouse name: N/A  . Number of children: N/A  . Years of education: N/A    Occupational History  . Not on file.   Social History Main Topics  . Smoking status: Former Research scientist (life sciences)  . Smokeless tobacco: Never Used  . Alcohol use No  . Drug use: No  . Sexual activity: No     Comment: lives at [facility].     Other Topics Concern  . Not on file   Social History Narrative  . No narrative on file   FH: non contributory   Review of Systems  All other systems reviewed and are negative.      Objective:   Physical Exam  Constitutional: She appears well-developed and well-nourished. No distress.  HENT:  Head: Normocephalic and atraumatic.  Right Ear: External ear normal.  Left Ear: External ear normal.  Nose: Nose normal.  Mouth/Throat: Oropharynx is clear and moist. No oropharyngeal exudate.  Eyes: Conjunctivae and EOM are normal. Pupils are equal, round, and reactive to light. Right eye exhibits no discharge. Left eye exhibits no discharge. No scleral icterus.  Neck: Normal range of motion. Neck supple. No JVD present. No tracheal deviation present. No thyromegaly present.  Cardiovascular: Normal rate, regular rhythm, normal heart sounds and intact distal pulses.  Exam reveals no gallop and no friction rub.   No murmur heard. Pulmonary/Chest: Effort normal and breath sounds normal. No respiratory distress. She has no wheezes. She has no rales.  Abdominal: Soft. Bowel sounds are normal. She exhibits no distension. There is no tenderness. There is no rebound and no guarding.  Musculoskeletal: She exhibits no edema.  Skin: She is not diaphoretic.  Vitals reviewed.         Assessment & Plan:  Shortness of breath - Plan: EKG 12-Lead, DG Chest 2 View, CBC with Differential/Platelet, COMPLETE METABOLIC PANEL WITH GFR, Brain natriuretic peptide, ECHOCARDIOGRAM COMPLETE  Her symptoms are very atypical. In my heart I believe she is having panic attacks. She has no angina. There is no association with activity. It occurs at the exact same time every day  it improves after she takes her morning medicines. Her EKG today is reassuring. However given her advanced age, her multiple medical comorbidities including insulin-dependent diabetes mellitus, hypertension, etc. she is certainly at risk for cardiovascular disease. Therefore I will check an echocardiogram of the heart along with a BNP. I will also check a CBC to rule out anemia as well as check her electrolytes. I'll check a chest x-ray. If the chest x-ray is normal, echocardiogram is normal, her lab work is normal, I'll treat the patient as possible panic attacks. She should develop chest pain, she would need a stress test of hard.

## 2016-07-16 ENCOUNTER — Ambulatory Visit
Admission: RE | Admit: 2016-07-16 | Discharge: 2016-07-16 | Disposition: A | Discharge status: Home / Self Care | Payer: Medicare Other | Source: Ambulatory Visit | Attending: Family Medicine | Admitting: Family Medicine

## 2016-07-16 DIAGNOSIS — R0602 Shortness of breath: Secondary | ICD-10-CM | POA: Diagnosis not present

## 2016-07-16 LAB — COMPLETE METABOLIC PANEL WITH GFR
ALBUMIN: 3.8 g/dL (ref 3.6–5.1)
ALK PHOS: 43 U/L (ref 33–130)
ALT: 17 U/L (ref 6–29)
AST: 22 U/L (ref 10–35)
BILIRUBIN TOTAL: 0.5 mg/dL (ref 0.2–1.2)
BUN: 15 mg/dL (ref 7–25)
CO2: 26 mmol/L (ref 20–31)
Calcium: 8.8 mg/dL (ref 8.6–10.4)
Chloride: 94 mmol/L — ABNORMAL LOW (ref 98–110)
Creat: 0.72 mg/dL (ref 0.60–0.88)
GFR, Est African American: >89 mL/min (ref >=60)
GFR, Est Non African American: 79 mL/min (ref >=60)
GLUCOSE: 183 mg/dL — AB (ref 70–99)
Potassium: 4 mmol/L (ref 3.5–5.3)
SODIUM: 130 mmol/L — AB (ref 135–146)
TOTAL PROTEIN: 7.1 g/dL (ref 6.1–8.1)

## 2016-07-16 LAB — CBC WITH DIFFERENTIAL/PLATELET
BASOS PCT: 0 %
Basophils Absolute: 0 cells/uL (ref 0–200)
EOS PCT: 1 %
Eosinophils Absolute: 77 cells/uL (ref 15–500)
HCT: 37.8 % (ref 35.0–45.0)
HEMOGLOBIN: 12.6 g/dL (ref 12.0–15.0)
LYMPHS ABS: 3080 {cells}/uL (ref 850–3900)
Lymphocytes Relative: 40 %
MCH: 30.6 pg (ref 27.0–33.0)
MCHC: 33.3 g/dL (ref 32.0–36.0)
MCV: 91.7 fL (ref 80.0–100.0)
MPV: 9.9 fL (ref 7.5–12.5)
Monocytes Absolute: 539 cells/uL (ref 200–950)
Monocytes Relative: 7 %
NEUTROS ABS: 4004 {cells}/uL (ref 1500–7800)
Neutrophils Relative %: 52 %
PLATELETS: 212 10*3/uL (ref 140–400)
RBC: 4.12 MIL/uL (ref 3.80–5.10)
RDW: 13.8 % (ref 11.0–15.0)
WBC: 7.7 10*3/uL (ref 3.8–10.8)

## 2016-07-16 LAB — BRAIN NATRIURETIC PEPTIDE: Brain Natriuretic Peptide: 18.2 pg/mL (ref <100)

## 2016-07-31 DIAGNOSIS — F251 Schizoaffective disorder, depressive type: Secondary | ICD-10-CM | POA: Diagnosis not present

## 2016-08-01 ENCOUNTER — Ambulatory Visit (HOSPITAL_COMMUNITY): Payer: Medicare Other | Attending: Cardiology

## 2016-08-01 ENCOUNTER — Encounter: Payer: Self-pay | Admitting: Family Medicine

## 2016-08-01 ENCOUNTER — Other Ambulatory Visit: Payer: Self-pay

## 2016-08-01 DIAGNOSIS — I10 Essential (primary) hypertension: Secondary | ICD-10-CM | POA: Diagnosis not present

## 2016-08-01 DIAGNOSIS — R0602 Shortness of breath: Secondary | ICD-10-CM | POA: Diagnosis not present

## 2016-08-01 DIAGNOSIS — E785 Hyperlipidemia, unspecified: Secondary | ICD-10-CM | POA: Diagnosis not present

## 2016-08-01 DIAGNOSIS — Z87891 Personal history of nicotine dependence: Secondary | ICD-10-CM | POA: Insufficient documentation

## 2016-08-01 DIAGNOSIS — I440 Atrioventricular block, first degree: Secondary | ICD-10-CM | POA: Diagnosis not present

## 2016-08-15 ENCOUNTER — Ambulatory Visit (INDEPENDENT_AMBULATORY_CARE_PROVIDER_SITE_OTHER): Payer: Medicare Other | Admitting: Family Medicine

## 2016-08-15 ENCOUNTER — Encounter: Payer: Self-pay | Admitting: Family Medicine

## 2016-08-15 VITALS — BP 138/70 | HR 74 | Temp 97.9°F | Resp 16 | Wt 234.0 lb

## 2016-08-15 DIAGNOSIS — IMO0001 Reserved for inherently not codable concepts without codable children: Secondary | ICD-10-CM

## 2016-08-15 DIAGNOSIS — G2119 Other drug induced secondary parkinsonism: Secondary | ICD-10-CM | POA: Diagnosis not present

## 2016-08-15 DIAGNOSIS — Z794 Long term (current) use of insulin: Secondary | ICD-10-CM | POA: Diagnosis not present

## 2016-08-15 DIAGNOSIS — E1165 Type 2 diabetes mellitus with hyperglycemia: Secondary | ICD-10-CM | POA: Diagnosis not present

## 2016-08-15 DIAGNOSIS — I10 Essential (primary) hypertension: Secondary | ICD-10-CM | POA: Diagnosis not present

## 2016-08-15 DIAGNOSIS — Z1231 Encounter for screening mammogram for malignant neoplasm of breast: Secondary | ICD-10-CM

## 2016-08-15 DIAGNOSIS — M810 Age-related osteoporosis without current pathological fracture: Secondary | ICD-10-CM

## 2016-08-15 DIAGNOSIS — Z1239 Encounter for other screening for malignant neoplasm of breast: Secondary | ICD-10-CM

## 2016-08-15 DIAGNOSIS — E038 Other specified hypothyroidism: Secondary | ICD-10-CM | POA: Diagnosis not present

## 2016-08-15 LAB — LIPID PANEL
CHOL/HDL RATIO: 2.3 ratio (ref <5.0)
CHOLESTEROL: 117 mg/dL (ref <200)
HDL: 52 mg/dL (ref >50)
LDL Cholesterol: 46 mg/dL (ref <100)
Triglycerides: 94 mg/dL (ref <150)
VLDL: 19 mg/dL (ref <30)

## 2016-08-15 LAB — HEMOGLOBIN, FINGERSTICK: HEMOGLOBIN, FINGERSTICK: 13.2 g/dL (ref 12.0–15.0)

## 2016-08-15 LAB — TSH: TSH: 1.76 m[IU]/L

## 2016-08-15 NOTE — Progress Notes (Signed)
Subjective:    Patient ID: Bonnie Watkins, female    DOB: [birth year], 82 y.o.   MRN: [id number]  HPI Here today for complete physical exam. Her last mammogram was in 2016. She is due again. Her last bone density was performed in 2016 and was positive for osteoporosis. She is currently on Fosamax but she is due again to recheck with another bone density test. Because her age, she does not require a Pap smear or colonoscopy. Her most recent immunizations are listed below: Immunization History  Administered Date(s) Administered  . Influenza, High Dose Seasonal PF 01/31/2016  . Influenza,inj,Quad PF,36+ Mos 02/07/2013  . Pneumococcal Conjugate-13 09/16/2013  . Pneumococcal Polysaccharide-23 11/26/2010   She is due for the shingles shot and we discussed this at length with her daughter. They need to check on the price first. She declines a tetanus shot. Past Medical History:  Diagnosis Date  . ACE inhibitor-aggravated angioedema   . Dementia   . Depression   . Diabetes mellitus without complication ([place])   . Diverticulosis   . GERD (gastroesophageal reflux disease)   . Hypertension   . Osteoporosis   . Schizophrenia ([place])    No past surgical history on file. Current Outpatient Prescriptions on File Prior to Visit  Medication Sig Dispense Refill  . alendronate (FOSAMAX) 70 MG tablet TAKE 1 TAB EACH WEEK 30 MIN PRIOR TO BREAKFAST WITH LARGE GLASS OF WATER. REMAIN UPRIGHT. 4 tablet 0  . ASPIRIN LOW DOSE 81 MG EC tablet TAKE ONE TABLET BY MOUTH ONCE DAILY. 30 tablet 11  . benztropine (COGENTIN) 1 MG tablet Take 1 mg by mouth 2 (two) times daily.    . citalopram (CELEXA) 20 MG tablet Take 20 mg by mouth daily.    . hydrochlorothiazide (HYDRODIURIL) 25 MG tablet TAKE ONE TABLET BY MOUTH ONCE DAILY. 30 tablet 11  . insulin glargine (LANTUS) 100 UNIT/ML injection Inject 12 Units into the skin every morning.     Marland Kitchen levothyroxine (SYNTHROID, LEVOTHROID) 50 MCG tablet TAKE ONE TABLET  BY MOUTH ONCE DAILY. 30 tablet 0  . memantine (NAMENDA) 10 MG tablet Take 1 tablet (10 mg total) by mouth 2 (two) times daily. 60 tablet 2  . metFORMIN (GLUCOPHAGE) 1000 MG tablet TAKE (1) TABLET BY MOUTH TWICE DAILY. 60 tablet 0  . omeprazole (PRILOSEC) 40 MG capsule TAKE (1) CAPSULE BY MOUTH ONCE DAILY. 30 capsule 11  . perphenazine (TRILAFON) 4 MG tablet Take 6 mg by mouth every morning.     Marland Kitchen QUEtiapine (SEROQUEL) 100 MG tablet Take 1 tablet (100 mg total) by mouth at bedtime. 30 tablet 5  . traMADol (ULTRAM) 50 MG tablet TAKE (1) TABLET BY MOUTH TWICE DAILY. 60 tablet 1  . traZODone (DESYREL) 150 MG tablet Take 150 mg by mouth at bedtime as needed for sleep.     Marland Kitchen docusate sodium (COLACE) 250 MG capsule 2 caps po bid (Patient taking differently: 2 caps twice daily as needed for constipation.) 120 capsule 11   No current facility-administered medications on file prior to visit.    Allergies  Allergen Reactions  . Shellfish Allergy Anaphylaxis    ALL SEAFOOD ALLERGY  . Ace Inhibitors Other (See Comments)    On MAR  . Angiotensin Receptor Blockers Other (See Comments)    ON MAR  . Lisinopril     Angioedema  . Strawberry Extract Hives  . Robaxin [Methocarbamol] Rash   Social History   Social History  . Marital status: Divorced  Spouse name: N/A  . Number of children: N/A  . Years of education: N/A   Occupational History  . Not on file.   Social History Main Topics  . Smoking status: Former Research scientist (life sciences)  . Smokeless tobacco: Never Used  . Alcohol use No  . Drug use: No  . Sexual activity: No     Comment: lives at [facility].     Other Topics Concern  . Not on file   Social History Narrative  . No narrative on file   No family history on file.    Review of Systems  All other systems reviewed and are negative.      Objective:   Physical Exam  Constitutional: She is oriented to person, place, and time. She appears well-developed and well-nourished. No  distress.  HENT:  Head: Normocephalic and atraumatic.  Right Ear: External ear normal.  Left Ear: External ear normal.  Nose: Nose normal.  Mouth/Throat: Oropharynx is clear and moist. No oropharyngeal exudate.  Eyes: Conjunctivae and EOM are normal. Pupils are equal, round, and reactive to light. Right eye exhibits no discharge. Left eye exhibits no discharge. No scleral icterus.  Neck: Normal range of motion. Neck supple. No JVD present. No tracheal deviation present. No thyromegaly present.  Cardiovascular: Normal rate, regular rhythm, normal heart sounds and intact distal pulses.  Exam reveals no gallop and no friction rub.   No murmur heard. Pulmonary/Chest: Effort normal and breath sounds normal. No stridor. No respiratory distress. She has no wheezes. She has no rales. She exhibits no tenderness.  Abdominal: Soft. Bowel sounds are normal. She exhibits no distension and no mass. There is no tenderness. There is no rebound and no guarding.  Musculoskeletal: Normal range of motion. She exhibits no tenderness.  Lymphadenopathy:    She has no cervical adenopathy.  Neurological: She is alert and oriented to person, place, and time. She has normal reflexes. She displays normal reflexes. No cranial nerve deficit. She exhibits normal muscle tone. Coordination normal.  Skin: Skin is warm. No rash noted. She is not diaphoretic. No erythema. No pallor.  Psychiatric: She has a normal mood and affect. Her behavior is normal. Judgment and thought content normal.  Vitals reviewed.         Assessment & Plan:  Uncontrolled type 2 diabetes mellitus without complication, with long-term current use of insulin (HCC) - Plan: Hemoglobin, fingerstick, Lipid panel, TSH, Hemoglobin A1c, Microalbumin, urine  Essential hypertension  Other specified hypothyroidism  Drug-induced Parkinsonism (HCC)  Age-related osteoporosis without current pathological fracture - Plan: DG Bone Density  Encounter for  screening breast examination - Plan: MM Digital Screening  His clinical exam today is completely normal. Her blood pressure is acceptable. I will check a hemoglobin A1c. Her goal hemoglobin A1c for her age and medical capabilities to be anything less than 8. I will also check a fasting lipid panel. Goal LDL cholesterol is less than 100. I'll schedule the patient for mammogram. I will also monitor management of her hypothyroidism with a TSH to ensure adequate dosage. I will schedule patient for a bone density test to evaluate her response to therapy on Fosamax for osteoporosis. Immunizations are up-to-date. I did encourage her shingles vaccine but they will check on the price of this first. She is not due for colonoscopy nor does she need a Pap smear. Diabetic eye exam is up-to-date

## 2016-08-16 LAB — MICROALBUMIN, URINE: Microalb, Ur: 0.4 mg/dL

## 2016-08-16 LAB — HEMOGLOBIN A1C
HEMOGLOBIN A1C: 7.1 % — AB (ref <5.7)
MEAN PLASMA GLUCOSE: 157 mg/dL

## 2016-08-19 ENCOUNTER — Encounter: Payer: Self-pay | Admitting: Family Medicine

## 2016-09-04 DIAGNOSIS — H04123 Dry eye syndrome of bilateral lacrimal glands: Secondary | ICD-10-CM | POA: Diagnosis not present

## 2016-09-04 DIAGNOSIS — H1013 Acute atopic conjunctivitis, bilateral: Secondary | ICD-10-CM | POA: Diagnosis not present

## 2016-09-04 DIAGNOSIS — H40013 Open angle with borderline findings, low risk, bilateral: Secondary | ICD-10-CM | POA: Diagnosis not present

## 2016-11-17 ENCOUNTER — Other Ambulatory Visit: Payer: Self-pay | Admitting: Family Medicine

## 2016-11-18 ENCOUNTER — Other Ambulatory Visit: Payer: Self-pay | Admitting: Family Medicine

## 2016-11-19 ENCOUNTER — Other Ambulatory Visit: Payer: Self-pay | Admitting: Family Medicine

## 2016-11-21 ENCOUNTER — Other Ambulatory Visit: Payer: Self-pay | Admitting: Family Medicine

## 2016-11-21 NOTE — Telephone Encounter (Signed)
ok 

## 2016-11-21 NOTE — Telephone Encounter (Signed)
Ok to refill 

## 2016-11-21 NOTE — Telephone Encounter (Signed)
Medication called to pharmacy. 

## 2016-12-01 ENCOUNTER — Other Ambulatory Visit: Payer: Self-pay | Admitting: Family Medicine

## 2016-12-03 ENCOUNTER — Other Ambulatory Visit: Payer: Self-pay | Admitting: Family Medicine

## 2016-12-05 ENCOUNTER — Ambulatory Visit
Admission: RE | Admit: 2016-12-05 | Discharge: 2016-12-05 | Disposition: A | Discharge status: Home / Self Care | Payer: Medicare Other | Source: Ambulatory Visit | Attending: Family Medicine | Admitting: Family Medicine

## 2016-12-05 DIAGNOSIS — Z1231 Encounter for screening mammogram for malignant neoplasm of breast: Secondary | ICD-10-CM | POA: Diagnosis not present

## 2016-12-05 DIAGNOSIS — Z1239 Encounter for other screening for malignant neoplasm of breast: Secondary | ICD-10-CM

## 2016-12-05 DIAGNOSIS — M85852 Other specified disorders of bone density and structure, left thigh: Secondary | ICD-10-CM | POA: Diagnosis not present

## 2016-12-05 DIAGNOSIS — M810 Age-related osteoporosis without current pathological fracture: Secondary | ICD-10-CM

## 2016-12-05 DIAGNOSIS — Z780 Asymptomatic menopausal state: Secondary | ICD-10-CM | POA: Diagnosis not present

## 2016-12-10 ENCOUNTER — Encounter: Payer: Self-pay | Admitting: Family Medicine

## 2016-12-30 ENCOUNTER — Other Ambulatory Visit: Payer: Self-pay | Admitting: Family Medicine

## 2016-12-31 ENCOUNTER — Other Ambulatory Visit: Payer: Self-pay | Admitting: Family Medicine

## 2017-01-01 ENCOUNTER — Other Ambulatory Visit: Payer: Self-pay | Admitting: Family Medicine

## 2017-01-15 DIAGNOSIS — F251 Schizoaffective disorder, depressive type: Secondary | ICD-10-CM | POA: Diagnosis not present

## 2017-01-16 ENCOUNTER — Other Ambulatory Visit: Payer: Self-pay | Admitting: Family Medicine

## 2017-01-16 NOTE — Telephone Encounter (Signed)
Ok to refill 

## 2017-01-16 NOTE — Telephone Encounter (Signed)
ok 

## 2017-01-29 DIAGNOSIS — Z23 Encounter for immunization: Secondary | ICD-10-CM | POA: Diagnosis not present

## 2017-03-09 DIAGNOSIS — H35033 Hypertensive retinopathy, bilateral: Secondary | ICD-10-CM | POA: Diagnosis not present

## 2017-03-09 DIAGNOSIS — H40013 Open angle with borderline findings, low risk, bilateral: Secondary | ICD-10-CM | POA: Diagnosis not present

## 2017-03-09 DIAGNOSIS — E113293 Type 2 diabetes mellitus with mild nonproliferative diabetic retinopathy without macular edema, bilateral: Secondary | ICD-10-CM | POA: Diagnosis not present

## 2017-03-09 DIAGNOSIS — Z961 Presence of intraocular lens: Secondary | ICD-10-CM | POA: Diagnosis not present

## 2017-03-09 LAB — HM DIABETES EYE EXAM

## 2017-03-10 ENCOUNTER — Encounter: Payer: Self-pay | Admitting: Family Medicine

## 2017-03-31 ENCOUNTER — Emergency Department (HOSPITAL_COMMUNITY)
Admission: EM | Admit: 2017-03-31 | Discharge: 2017-03-31 | Disposition: A | Discharge status: Home / Self Care | Payer: Medicare Other | Attending: Emergency Medicine | Admitting: Emergency Medicine

## 2017-03-31 ENCOUNTER — Encounter (HOSPITAL_COMMUNITY): Payer: Self-pay | Admitting: Nurse Practitioner

## 2017-03-31 ENCOUNTER — Other Ambulatory Visit: Payer: Self-pay

## 2017-03-31 ENCOUNTER — Emergency Department (HOSPITAL_COMMUNITY): Payer: Medicare Other

## 2017-03-31 DIAGNOSIS — S8992XA Unspecified injury of left lower leg, initial encounter: Secondary | ICD-10-CM | POA: Diagnosis not present

## 2017-03-31 DIAGNOSIS — M25511 Pain in right shoulder: Secondary | ICD-10-CM | POA: Insufficient documentation

## 2017-03-31 DIAGNOSIS — Z794 Long term (current) use of insulin: Secondary | ICD-10-CM | POA: Insufficient documentation

## 2017-03-31 DIAGNOSIS — Z7982 Long term (current) use of aspirin: Secondary | ICD-10-CM | POA: Diagnosis not present

## 2017-03-31 DIAGNOSIS — S4991XA Unspecified injury of right shoulder and upper arm, initial encounter: Secondary | ICD-10-CM | POA: Diagnosis not present

## 2017-03-31 DIAGNOSIS — G4489 Other headache syndrome: Secondary | ICD-10-CM | POA: Diagnosis not present

## 2017-03-31 DIAGNOSIS — E119 Type 2 diabetes mellitus without complications: Secondary | ICD-10-CM | POA: Diagnosis not present

## 2017-03-31 DIAGNOSIS — Z79899 Other long term (current) drug therapy: Secondary | ICD-10-CM | POA: Insufficient documentation

## 2017-03-31 DIAGNOSIS — Z87891 Personal history of nicotine dependence: Secondary | ICD-10-CM | POA: Insufficient documentation

## 2017-03-31 DIAGNOSIS — M25561 Pain in right knee: Secondary | ICD-10-CM | POA: Insufficient documentation

## 2017-03-31 DIAGNOSIS — M25562 Pain in left knee: Secondary | ICD-10-CM | POA: Insufficient documentation

## 2017-03-31 DIAGNOSIS — I10 Essential (primary) hypertension: Secondary | ICD-10-CM | POA: Diagnosis not present

## 2017-03-31 DIAGNOSIS — S8991XA Unspecified injury of right lower leg, initial encounter: Secondary | ICD-10-CM | POA: Diagnosis not present

## 2017-03-31 DIAGNOSIS — S0990XA Unspecified injury of head, initial encounter: Secondary | ICD-10-CM | POA: Diagnosis not present

## 2017-03-31 DIAGNOSIS — F0390 Unspecified dementia without behavioral disturbance: Secondary | ICD-10-CM | POA: Diagnosis not present

## 2017-03-31 DIAGNOSIS — W19XXXA Unspecified fall, initial encounter: Secondary | ICD-10-CM

## 2017-03-31 DIAGNOSIS — M79621 Pain in right upper arm: Secondary | ICD-10-CM | POA: Diagnosis not present

## 2017-03-31 MED ORDER — ACETAMINOPHEN 325 MG PO TABS
650.0000 mg | ORAL_TABLET | Freq: Once | ORAL | Status: AC
  Administered 2017-03-31: 650 mg via ORAL
  Filled 2017-03-31: qty 2

## 2017-03-31 MED ORDER — IBUPROFEN 200 MG PO TABS
600.0000 mg | ORAL_TABLET | Freq: Once | ORAL | Status: AC
  Administered 2017-03-31: 600 mg via ORAL
  Filled 2017-03-31: qty 3

## 2017-03-31 NOTE — ED Triage Notes (Signed)
Patient brought in by EMS from [place] from [facility]. patient fell this AM and hit her head no LOC. Patient also complains of right leg pain. No blood thinners.

## 2017-03-31 NOTE — ED Notes (Signed)
Bed: WA23 Expected date:  Expected time:  Means of arrival:  Comments: 

## 2017-04-01 NOTE — ED Provider Notes (Signed)
[facility] DEPT Provider Note   CSN: [id number] Arrival date & time: 03/31/17  1128     History   Chief Complaint No chief complaint on file.   HPI Bonnie Watkins is a 82 y.o. female.  HPI Patient is an 82 year old female who resides at [facility] home.  She reportedly had a fall this morning and struck her head without loss consciousness.  No significant hematoma of her scalp.  She complains of bilateral knee pain right arm and right shoulder pain.  Denies pain in her wrists.  Denies neck pain.  Denies headache at this time.  No use of anticoagulants.  No vomiting.  Denies chest pain and abdominal pain.  No back pain.   Past Medical History:  Diagnosis Date  . ACE inhibitor-aggravated angioedema   . Dementia   . Depression   . Diabetes mellitus without complication ([place])   . Diverticulosis   . GERD (gastroesophageal reflux disease)   . Hypertension   . Osteoporosis   . Schizophrenia [facility])     Patient Active Problem List   Diagnosis Date Noted  . First degree AV block 11/23/2013  . Diabetes mellitus without complication ([place])   . Schizophrenia ([place])   . Hypertension   . Dementia   . Depression   . ACE inhibitor-aggravated angioedema   . Diverticulosis     History reviewed. No pertinent surgical history.  OB History    No data available       Home Medications    Prior to Admission medications   Medication Sig Start Date End Date Taking? Authorizing Provider  alendronate (FOSAMAX) 70 MG tablet TAKE 1 TAB EACH WEEK 30 MIN PRIOR TO BREAKFAST WITH LARGE GLASS OF WATER. REMAIN UPRIGHT. 11/17/16  Yes Susy Frizzle, MD  aspirin (ASPIRIN LOW DOSE) 81 MG EC tablet TAKE ONE TABLET BY MOUTH ONCE DAILY. 11/18/16  Yes Susy Frizzle, MD  benztropine (COGENTIN) 1 MG tablet Take 1 mg by mouth 2 (two) times daily.   Yes [provider]  Calcium Citrate-Vitamin D (CITRACAL PETITES/VITAMIN D) 200-250 MG-UNIT  TABS TAKE (2) TABLETS BY MOUTH TWICE DAILY. 12/31/16  Yes Susy Frizzle, MD  citalopram (CELEXA) 20 MG tablet Take 20 mg by mouth daily.   Yes [provider]  cyclobenzaprine (FLEXERIL) 10 MG tablet Take 10 mg by mouth every 8 (eight) hours as needed for muscle spasms.   Yes [provider]  docusate sodium (COLACE) 100 MG capsule TAKE (1) CAPSULE BY MOUTH ONCE DAILY. 01/01/17  Yes Susy Frizzle, MD  hydrochlorothiazide (HYDRODIURIL) 25 MG tablet TAKE ONE TABLET BY MOUTH ONCE DAILY. 11/18/16  Yes Susy Frizzle, MD  LANTUS 100 UNIT/ML injection INJECT 14 UNITS SUBCUTANEOUSLY EACH MORNING. 12/01/16  Yes Susy Frizzle, MD  levothyroxine (SYNTHROID, LEVOTHROID) 50 MCG tablet TAKE ONE TABLET BY MOUTH ONCE DAILY. 11/19/16  Yes Susy Frizzle, MD  memantine (NAMENDA) 10 MG tablet Take 1 tablet (10 mg total) by mouth 2 (two) times daily. 09/21/12  Yes Susy Frizzle, MD  metFORMIN (GLUCOPHAGE) 1000 MG tablet TAKE (1) TABLET BY MOUTH TWICE DAILY. 11/18/16  Yes Susy Frizzle, MD  omeprazole (PRILOSEC) 40 MG capsule TAKE (1) CAPSULE BY MOUTH ONCE DAILY. 12/31/16  Yes Susy Frizzle, MD  perphenazine (TRILAFON) 4 MG tablet Take 6 mg by mouth every morning.    Yes [provider]  Polyethyl Glycol-Propyl Glycol (SYSTANE) 0.4-0.3 % SOLN Place 1 drop into  both eyes 2 (two) times daily.   Yes [provider]  QUEtiapine (SEROQUEL) 100 MG tablet Take 1 tablet (100 mg total) by mouth at bedtime. 12/07/15  Yes Susy Frizzle, MD  SAFETY-LOK INSULIN SYR 1CC/29G 29G X 1/2" 1 ML MISC USE AS DIRECTED. 12/31/16  Yes Susy Frizzle, MD  traMADol (ULTRAM) 50 MG tablet TAKE (1) TABLET BY MOUTH TWICE DAILY. 01/16/17  Yes Susy Frizzle, MD  traZODone (DESYREL) 150 MG tablet Take 150 mg by mouth at bedtime as needed for sleep.    Yes [provider]    Family History History reviewed. No pertinent family history.  Social History Social History    Tobacco Use  . Smoking status: Former Research scientist (life sciences)  . Smokeless tobacco: Never Used  Substance Use Topics  . Alcohol use: No  . Drug use: No     Allergies   Shellfish allergy; Ace inhibitors; Angiotensin receptor blockers; Lisinopril; Strawberry extract; and Robaxin [methocarbamol]   Review of Systems Review of Systems  All other systems reviewed and are negative.    Physical Exam Updated Vital Signs BP (!) 168/88   Pulse 80   Resp 20   SpO2 96%   Physical Exam  Constitutional: She appears well-developed and well-nourished. No distress.  HENT:  Head: Normocephalic and atraumatic.  No abrasion/hematoma/laceration of her scalp  Eyes: EOM are normal.  Neck: Normal range of motion. Neck supple.  C-spine nontender.  Full range of motion of neck  Cardiovascular: Normal rate and regular rhythm.  Pulmonary/Chest: Effort normal and breath sounds normal.  Abdominal: Soft. She exhibits no distension. There is no tenderness.  Musculoskeletal: Normal range of motion.  Full range of motion of bilateral shoulders, elbows and wrists. Full range of motion of bilateral hips, knees and ankles.  Mild tenderness right lateral shoulder without obvious deformity.  Clavicles are normal bilaterally.  Small area of bruising noted on her bilateral patellar regions without obvious deformity.  Normal pulses distally.   Neurological: She is alert.  Skin: Skin is warm and dry.  Psychiatric: She has a normal mood and affect. Judgment normal.  Nursing note and vitals reviewed.    ED Treatments / Results  Labs (all labs ordered are listed, but only abnormal results are displayed) Labs Reviewed - No data to display  EKG  EKG Interpretation None       Radiology Dg Shoulder Right  Result Date: 03/31/2017 CLINICAL DATA:  82 year old who fell of the nursing home earlier today injuring the right shoulder. Initial encounter. EXAM: RIGHT SHOULDER - 2+ VIEW COMPARISON:  Right humerus x-rays  obtained concurrently. Visualized right shoulder on a chest x-ray 07/16/2016. Report of right shoulder x-rays 03/08/2011 describing a comminuted fracture of the humeral head and proximal humeral metaphysis. 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 Numerous calcified loose bodies in the joint. No convincing evidence of an acute fracture. Acromioclavicular joint intact with moderate degenerative changes. Glenohumeral joint intact with degenerative changes. IMPRESSION: 1. No convincing evidence of acute osseous abnormality. 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 2. Numerous calcified loose bodies in the joint. 3. Degenerative changes involving the glenohumeral joint and the acromioclavicular joint. Electronically Signed   By: Evangeline Dakin M.D.   On: 03/31/2017 15:58   Dg Knee Complete 4 Views Left  Result Date: 03/31/2017 CLINICAL DATA:  Fall.  Knee pain. EXAM: LEFT KNEE - COMPLETE 4+ VIEW COMPARISON:  None. FINDINGS: Tricompartment degenerative change. No acute bony abnormality identified no evidence of fracture or dislocation. IMPRESSION: Tricompartment degenerative change.  No acute abnormality. Electronically Signed   By: Marcello Moores  Register   On: 03/31/2017 14:49   Dg Knee Complete 4 Views Right  Result Date: 03/31/2017 CLINICAL DATA:  82 year old who fell at the nursing home earlier today injuring both knees and the right shoulder. Initial encounter. EXAM: RIGHT KNEE - COMPLETE 4+ VIEW COMPARISON:  None. FINDINGS: No evidence of acute fracture or dislocation. Severe patellofemoral compartment joint space narrowing, moderate to severe medial compartment joint space narrowing and mild lateral compartment joint space narrowing with associated spurring. Osseous demineralization. No visible joint effusion. IMPRESSION: 1. No acute osseous  abnormality. 2. Osteoarthritis. 3. Osseous demineralization. Electronically Signed   By: Evangeline Dakin M.D.   On: 03/31/2017 14:56   Dg Humerus Right  Result Date: 03/31/2017 CLINICAL DATA:  82 year old who fell at the nursing home earlier today injuring both knees and the right shoulder/right upper arm. Initial encounter. EXAM: RIGHT HUMERUS - 2+ VIEW COMPARISON:  Right shoulder x-rays obtained concurrently. Visualized right shoulder on chest x-ray 07/16/2016 and earlier. 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 Numerous calcified loose bodies in the right shoulder joint. No acute fractures involving the humerus. Osseous demineralization. Relatively well-preserved joint spaces in the elbow. IMPRESSION: 1. No acute osseous abnormality. 2. 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 Numerous calcified loose bodies in the right shoulder joint. Electronically Signed   By: Evangeline Dakin M.D.   On: 03/31/2017 14:58    Procedures Procedures (including critical care time)  Medications Ordered in ED Medications  acetaminophen (TYLENOL) tablet 650 mg (650 mg Oral Given 03/31/17 1301)  ibuprofen (ADVIL,MOTRIN) tablet 600 mg (600 mg Oral Given 03/31/17 1301)     Initial Impression / Assessment and Plan / ED Course  I have reviewed the triage vital signs and the nursing notes.  Pertinent labs & imaging results that were available during my care of the patient were reviewed by me and considered in my medical decision making (see chart for details).     Contusions without fractures.  Discharged home in good condition.  Primary care follow-up.  C-spine nontender.  Final Clinical Impressions(s) / ED Diagnoses   Final diagnoses:  Fall, initial encounter  Acute bilateral knee pain  Acute pain of right shoulder    ED Discharge Orders    None       Jola Schmidt, MD 04/01/17 [id number]

## 2017-04-14 DIAGNOSIS — M6281 Muscle weakness (generalized): Secondary | ICD-10-CM | POA: Diagnosis not present

## 2017-04-15 DIAGNOSIS — M6281 Muscle weakness (generalized): Secondary | ICD-10-CM | POA: Diagnosis not present

## 2017-04-17 DIAGNOSIS — M6281 Muscle weakness (generalized): Secondary | ICD-10-CM | POA: Diagnosis not present

## 2017-04-21 DIAGNOSIS — M6281 Muscle weakness (generalized): Secondary | ICD-10-CM | POA: Diagnosis not present

## 2017-04-22 DIAGNOSIS — M6281 Muscle weakness (generalized): Secondary | ICD-10-CM | POA: Diagnosis not present

## 2017-04-23 DIAGNOSIS — M6281 Muscle weakness (generalized): Secondary | ICD-10-CM | POA: Diagnosis not present

## 2017-04-27 DIAGNOSIS — M6281 Muscle weakness (generalized): Secondary | ICD-10-CM | POA: Diagnosis not present

## 2017-04-29 DIAGNOSIS — M6281 Muscle weakness (generalized): Secondary | ICD-10-CM | POA: Diagnosis not present

## 2017-04-30 DIAGNOSIS — M6281 Muscle weakness (generalized): Secondary | ICD-10-CM | POA: Diagnosis not present

## 2017-05-06 DIAGNOSIS — M6281 Muscle weakness (generalized): Secondary | ICD-10-CM | POA: Diagnosis not present

## 2017-05-07 DIAGNOSIS — M6281 Muscle weakness (generalized): Secondary | ICD-10-CM | POA: Diagnosis not present

## 2017-05-12 DIAGNOSIS — M6281 Muscle weakness (generalized): Secondary | ICD-10-CM | POA: Diagnosis not present

## 2017-05-13 DIAGNOSIS — M6281 Muscle weakness (generalized): Secondary | ICD-10-CM | POA: Diagnosis not present

## 2017-05-14 DIAGNOSIS — M6281 Muscle weakness (generalized): Secondary | ICD-10-CM | POA: Diagnosis not present

## 2017-05-15 DIAGNOSIS — M6281 Muscle weakness (generalized): Secondary | ICD-10-CM | POA: Diagnosis not present

## 2017-05-18 ENCOUNTER — Other Ambulatory Visit: Payer: Self-pay | Admitting: Family Medicine

## 2017-05-18 DIAGNOSIS — M6281 Muscle weakness (generalized): Secondary | ICD-10-CM | POA: Diagnosis not present

## 2017-05-20 DIAGNOSIS — M6281 Muscle weakness (generalized): Secondary | ICD-10-CM | POA: Diagnosis not present

## 2017-05-21 ENCOUNTER — Ambulatory Visit (INDEPENDENT_AMBULATORY_CARE_PROVIDER_SITE_OTHER): Payer: Medicare Other | Admitting: Family Medicine

## 2017-05-21 VITALS — BP 150/68 | HR 78 | Temp 98.2°F | Resp 18 | Ht 67.0 in

## 2017-05-21 DIAGNOSIS — M6281 Muscle weakness (generalized): Secondary | ICD-10-CM | POA: Diagnosis not present

## 2017-05-21 DIAGNOSIS — M25361 Other instability, right knee: Secondary | ICD-10-CM

## 2017-05-21 NOTE — Progress Notes (Signed)
Subjective:    Patient ID: Bonnie Watkins, female    DOB: 18-Dec-1934, 83 y.o.   MRN: [id number]  HPI Patient went to the emergency room in November after a fall. X-rays obtained in November revealed tricompartmental arthritis in the right knee. Ever since the fall, patient reports instability in the right knee. She has fallen several times. She states that the right knee simply gives way and will buckle unexpectedly causing her to fall. She states that occasionally the knee will swell. However she denies any pain in the knee. She denies any erythema. There is no effusion today. Her biggest concern is the instability in the knee. However she refuses surgery. Exam today shows no evidence of laxity to varus or valgus stress. Exam is limited by her body habitus and her confinement to a wheelchair but anterior and posterior drawer signs appear negative. She does have pain with Apley grind suggesting a meniscal tear Past Medical History:  Diagnosis Date  . ACE inhibitor-aggravated angioedema   . Dementia   . Depression   . Diabetes mellitus without complication ([place])   . Diverticulosis   . GERD (gastroesophageal reflux disease)   . Hypertension   . Osteoporosis   . Schizophrenia ([place])    No past surgical history on file. Current Outpatient Medications on File Prior to Visit  Medication Sig Dispense Refill  . alendronate (FOSAMAX) 70 MG tablet TAKE 1 TAB EACH WEEK 30 MIN PRIOR TO BREAKFAST WITH LARGE GLASS OF WATER. REMAIN UPRIGHT. 4 tablet 11  . aspirin 81 MG EC tablet TAKE (1) TABLET BY MOUTH ONCE DAILY. 90 tablet 3  . benztropine (COGENTIN) 1 MG tablet Take 1 mg by mouth 2 (two) times daily.    . Calcium Citrate-Vitamin D (CITRACAL PETITES/VITAMIN D) 200-250 MG-UNIT TABS TAKE (2) TABLETS BY MOUTH TWICE DAILY. 120 each 5  . citalopram (CELEXA) 20 MG tablet Take 20 mg by mouth daily.    . cyclobenzaprine (FLEXERIL) 10 MG tablet Take 10 mg by mouth every 8 (eight) hours as needed for  muscle spasms.    Marland Kitchen docusate sodium (COLACE) 100 MG capsule TAKE (1) CAPSULE BY MOUTH ONCE DAILY. 30 capsule 11  . hydrochlorothiazide (HYDRODIURIL) 25 MG tablet TAKE ONE TABLET BY MOUTH ONCE DAILY. 30 tablet 5  . LANTUS 100 UNIT/ML injection INJECT 14 UNITS SUBCUTANEOUSLY EACH MORNING. 10 mL 3  . levothyroxine (SYNTHROID, LEVOTHROID) 50 MCG tablet TAKE ONE TABLET BY MOUTH ONCE DAILY. 30 tablet 5  . memantine (NAMENDA) 10 MG tablet Take 1 tablet (10 mg total) by mouth 2 (two) times daily. 60 tablet 2  . metFORMIN (GLUCOPHAGE) 1000 MG tablet TAKE (1) TABLET BY MOUTH TWICE DAILY. 60 tablet 3  . omeprazole (PRILOSEC) 40 MG capsule TAKE (1) CAPSULE BY MOUTH ONCE DAILY. 30 capsule 11  . perphenazine (TRILAFON) 4 MG tablet Take 6 mg by mouth every morning.     Vladimir Faster Glycol-Propyl Glycol (SYSTANE) 0.4-0.3 % SOLN Place 1 drop into both eyes 2 (two) times daily.    . QUEtiapine (SEROQUEL) 100 MG tablet Take 1 tablet (100 mg total) by mouth at bedtime. 30 tablet 5  . SAFETY-LOK INSULIN SYR 1CC/29G 29G X 1/2" 1 ML MISC USE AS DIRECTED. 100 each 5  . traMADol (ULTRAM) 50 MG tablet TAKE (1) TABLET BY MOUTH TWICE DAILY. 60 tablet 2  . traZODone (DESYREL) 150 MG tablet Take 150 mg by mouth at bedtime as needed for sleep.      No current facility-administered  medications on file prior to visit.    Allergies  Allergen Reactions  . Shellfish Allergy Anaphylaxis    ALL SEAFOOD ALLERGY  . Ace Inhibitors Other (See Comments)    On MAR  . Angiotensin Receptor Blockers Other (See Comments)    ON MAR  . Lisinopril     Angioedema  . Strawberry Extract Hives  . Robaxin [Methocarbamol] Rash   Social History   Socioeconomic History  . Marital status: Divorced    Spouse name: Not on file  . Number of children: Not on file  . Years of education: Not on file  . Highest education level: Not on file  Social Needs  . Financial resource strain: Not on file  . Food insecurity - worry: Not on file  . Food  insecurity - inability: Not on file  . Transportation needs - medical: Not on file  . Transportation needs - non-medical: Not on file  Occupational History  . Not on file  Tobacco Use  . Smoking status: Former Research scientist (life sciences)  . Smokeless tobacco: Never Used  Substance and Sexual Activity  . Alcohol use: No  . Drug use: No  . Sexual activity: No    Comment: lives at [facility].    Other Topics Concern  . Not on file  Social History Narrative  . Not on file      Review of Systems  All other systems reviewed and are negative.      Objective:   Physical Exam  Cardiovascular: Normal rate and regular rhythm.  Pulmonary/Chest: Effort normal and breath sounds normal.  Musculoskeletal:       Right knee: She exhibits abnormal meniscus. She exhibits normal range of motion, no swelling, no effusion, no LCL laxity, normal patellar mobility, no bony tenderness and no MCL laxity. No medial joint line, no lateral joint line, no MCL and no LCL tenderness noted.  Vitals reviewed.         Assessment & Plan:  Knee instability, right  I believe this is multifactorial due to age, tricompartmental arthritis of the knee, likely meniscal tear. Exam today is difficult due to body habitus however I appreciate no evidence of an MCL, LCL, PCL, or anterior cruciate ligament tear. We discussed MRI to rule out a tear that would require surgery however the patient states that she does not want any surgery. Therefore I see no reason to proceed with an MRI. She denies any pain. Therefore I will recommend a hinged knee brace whenever the patient is ambulating. Also recommended physical therapy to try to strengthen the muscles the control knee flexion and extension as well as to strengthen the muscles in her right leg to help prevent future falls. If instability is not improving over the next 4-6 weeks, I would recommend an MRI of the right knee

## 2017-05-22 DIAGNOSIS — M6281 Muscle weakness (generalized): Secondary | ICD-10-CM | POA: Diagnosis not present

## 2017-05-25 DIAGNOSIS — M6281 Muscle weakness (generalized): Secondary | ICD-10-CM | POA: Diagnosis not present

## 2017-05-26 DIAGNOSIS — M6281 Muscle weakness (generalized): Secondary | ICD-10-CM | POA: Diagnosis not present

## 2017-05-27 DIAGNOSIS — M6281 Muscle weakness (generalized): Secondary | ICD-10-CM | POA: Diagnosis not present

## 2017-05-28 DIAGNOSIS — M6281 Muscle weakness (generalized): Secondary | ICD-10-CM | POA: Diagnosis not present

## 2017-05-29 DIAGNOSIS — M6281 Muscle weakness (generalized): Secondary | ICD-10-CM | POA: Diagnosis not present

## 2017-06-01 DIAGNOSIS — M6281 Muscle weakness (generalized): Secondary | ICD-10-CM | POA: Diagnosis not present

## 2017-06-03 ENCOUNTER — Other Ambulatory Visit: Payer: Self-pay | Admitting: Family Medicine

## 2017-06-03 DIAGNOSIS — M6281 Muscle weakness (generalized): Secondary | ICD-10-CM | POA: Diagnosis not present

## 2017-06-04 ENCOUNTER — Telehealth: Payer: Self-pay | Admitting: Family Medicine

## 2017-06-04 NOTE — Telephone Encounter (Signed)
Bonnie Watkins from [facility] called and states that pt needs a 3 in 1 commode and they need an order sent to [place] - order faxed to [phone].

## 2017-06-05 DIAGNOSIS — M6281 Muscle weakness (generalized): Secondary | ICD-10-CM | POA: Diagnosis not present

## 2017-06-08 DIAGNOSIS — M6281 Muscle weakness (generalized): Secondary | ICD-10-CM | POA: Diagnosis not present

## 2017-06-11 DIAGNOSIS — M6281 Muscle weakness (generalized): Secondary | ICD-10-CM | POA: Diagnosis not present

## 2017-06-12 DIAGNOSIS — M6281 Muscle weakness (generalized): Secondary | ICD-10-CM | POA: Diagnosis not present

## 2017-06-15 DIAGNOSIS — M6281 Muscle weakness (generalized): Secondary | ICD-10-CM | POA: Diagnosis not present

## 2017-06-16 DIAGNOSIS — M6281 Muscle weakness (generalized): Secondary | ICD-10-CM | POA: Diagnosis not present

## 2017-06-17 DIAGNOSIS — M6281 Muscle weakness (generalized): Secondary | ICD-10-CM | POA: Diagnosis not present

## 2017-06-18 DIAGNOSIS — M6281 Muscle weakness (generalized): Secondary | ICD-10-CM | POA: Diagnosis not present

## 2017-06-19 DIAGNOSIS — M6281 Muscle weakness (generalized): Secondary | ICD-10-CM | POA: Diagnosis not present

## 2017-06-22 DIAGNOSIS — M6281 Muscle weakness (generalized): Secondary | ICD-10-CM | POA: Diagnosis not present

## 2017-06-24 DIAGNOSIS — M6281 Muscle weakness (generalized): Secondary | ICD-10-CM | POA: Diagnosis not present

## 2017-06-25 DIAGNOSIS — M6281 Muscle weakness (generalized): Secondary | ICD-10-CM | POA: Diagnosis not present

## 2017-06-26 DIAGNOSIS — M6281 Muscle weakness (generalized): Secondary | ICD-10-CM | POA: Diagnosis not present

## 2017-06-29 DIAGNOSIS — M6281 Muscle weakness (generalized): Secondary | ICD-10-CM | POA: Diagnosis not present

## 2017-06-30 DIAGNOSIS — F251 Schizoaffective disorder, depressive type: Secondary | ICD-10-CM | POA: Diagnosis not present

## 2017-06-30 DIAGNOSIS — M6281 Muscle weakness (generalized): Secondary | ICD-10-CM | POA: Diagnosis not present

## 2017-07-01 DIAGNOSIS — M6281 Muscle weakness (generalized): Secondary | ICD-10-CM | POA: Diagnosis not present

## 2017-07-02 ENCOUNTER — Ambulatory Visit (INDEPENDENT_AMBULATORY_CARE_PROVIDER_SITE_OTHER): Payer: Medicare Other | Admitting: Family Medicine

## 2017-07-02 ENCOUNTER — Other Ambulatory Visit: Payer: Self-pay

## 2017-07-02 ENCOUNTER — Encounter: Payer: Self-pay | Admitting: Family Medicine

## 2017-07-02 VITALS — BP 142/66 | HR 80 | Temp 99.0°F | Resp 14 | Ht 67.0 in | Wt 225.0 lb

## 2017-07-02 DIAGNOSIS — R296 Repeated falls: Secondary | ICD-10-CM

## 2017-07-02 DIAGNOSIS — M25361 Other instability, right knee: Secondary | ICD-10-CM

## 2017-07-02 DIAGNOSIS — G2119 Other drug induced secondary parkinsonism: Secondary | ICD-10-CM

## 2017-07-02 NOTE — Progress Notes (Signed)
Subjective:    Patient ID: Bonnie Watkins, female    DOB: May 26, 1934, 83 y.o.   MRN: [id number]  HPI  05/30/17 Patient went to the emergency room in November after a fall. X-rays obtained in November revealed tricompartmental arthritis in the right knee. Ever since the fall, patient reports instability in the right knee. She has fallen several times. She states that the right knee simply gives way and will buckle unexpectedly causing her to fall. She states that occasionally the knee will swell. However she denies any pain in the knee. She denies any erythema. There is no effusion today. Her biggest concern is the instability in the knee. However she refuses surgery. Exam today shows no evidence of laxity to varus or valgus stress. Exam is limited by her body habitus and her confinement to a wheelchair but anterior and posterior drawer signs appear negative. She does have pain with Apley grind suggesting a meniscal tear.  At that time, my plan was: I believe this is multifactorial due to age, tricompartmental arthritis of the knee, likely meniscal tear. Exam today is difficult due to body habitus however I appreciate no evidence of an MCL, LCL, PCL, or anterior cruciate ligament tear. We discussed MRI to rule out a tear that would require surgery however the patient states that she does not want any surgery. Therefore I see no reason to proceed with an MRI. She denies any pain. Therefore I will recommend a hinged knee brace whenever the patient is ambulating. Also recommended physical therapy to try to strengthen the muscles the control knee flexion and extension as well as to strengthen the muscles in her right leg to help prevent future falls. If instability is not improving over the next 4-6 weeks, I would recommend an MRI of the right knee  07/02/17 Patient is complying wearing the hinged knee brace.  However she is fallen twice more since I last saw her.  The most recent time was in the  bathroom.  She states that whenever she tries to stand up, she has to put pressure on her right knee with her hands to help push her to a standing position.  The right knee will frequently buckle and she will lose her balance and fall injuring herself.  She is now fallen 3 times in the last 3 months once requiring an emergency room visit.  She is receiving physical therapy at the nursing home but this does not seem to be helping.  She is also on medication for schizophrenia.  She demonstrates tardive dyskinesia.  She also has some mild drug-induced parkinsonism which may be contributing to her lack of balance and falls as well.  There have been no changes in her psychiatric medications in years.  She denies any hallucinations or delusions or delirious behavior Past Medical History:  Diagnosis Date  . ACE inhibitor-aggravated angioedema   . Dementia   . Depression   . Diabetes mellitus without complication ([place])   . Diverticulosis   . GERD (gastroesophageal reflux disease)   . Hypertension   . Osteoporosis   . Schizophrenia ([place])    History reviewed. No pertinent surgical history. Current Outpatient Medications on File Prior to Visit  Medication Sig Dispense Refill  . alendronate (FOSAMAX) 70 MG tablet TAKE 1 TAB EACH WEEK 30 MIN PRIOR TO BREAKFAST WITH LARGE GLASS OF WATER. REMAIN UPRIGHT. 4 tablet 11  . aspirin 81 MG EC tablet TAKE (1) TABLET BY MOUTH ONCE DAILY. 90 tablet 3  .  benztropine (COGENTIN) 1 MG tablet Take 1 mg by mouth 2 (two) times daily.    . Calcium Citrate-Vitamin D (CITRACAL PETITES/VITAMIN D) 200-250 MG-UNIT TABS TAKE (2) TABLETS BY MOUTH TWICE DAILY. 120 each 5  . citalopram (CELEXA) 20 MG tablet Take 20 mg by mouth daily.    . cyclobenzaprine (FLEXERIL) 10 MG tablet Take 10 mg by mouth every 8 (eight) hours as needed for muscle spasms.    Marland Kitchen docusate sodium (COLACE) 100 MG capsule TAKE (1) CAPSULE BY MOUTH ONCE DAILY. 30 capsule 11  . GAVILAX powder MIX 1 CAPFUL (17G) IN 8  OUNCES OF JUICE/WATER AND DRINK ONCE DAILY ASNEEDED FOR CONSTIPATION. 510 g 3  . hydrochlorothiazide (HYDRODIURIL) 25 MG tablet TAKE ONE TABLET BY MOUTH ONCE DAILY. 30 tablet 5  . LANTUS 100 UNIT/ML injection INJECT 14 UNITS SUBCUTANEOUSLY EACH MORNING. 10 mL 3  . levothyroxine (SYNTHROID, LEVOTHROID) 50 MCG tablet TAKE ONE TABLET BY MOUTH ONCE DAILY. 30 tablet 5  . memantine (NAMENDA) 10 MG tablet Take 1 tablet (10 mg total) by mouth 2 (two) times daily. 60 tablet 2  . metFORMIN (GLUCOPHAGE) 1000 MG tablet TAKE (1) TABLET BY MOUTH TWICE DAILY. 60 tablet 3  . omeprazole (PRILOSEC) 40 MG capsule TAKE (1) CAPSULE BY MOUTH ONCE DAILY. 30 capsule 11  . perphenazine (TRILAFON) 4 MG tablet Take 6 mg by mouth every morning.     Vladimir Faster Glycol-Propyl Glycol (SYSTANE) 0.4-0.3 % SOLN Place 1 drop into both eyes 2 (two) times daily.    . QUEtiapine (SEROQUEL) 100 MG tablet Take 1 tablet (100 mg total) by mouth at bedtime. 30 tablet 5  . SAFETY-LOK INSULIN SYR 1CC/29G 29G X 1/2" 1 ML MISC USE AS DIRECTED. 100 each 5  . traMADol (ULTRAM) 50 MG tablet TAKE (1) TABLET BY MOUTH TWICE DAILY. 60 tablet 2  . traZODone (DESYREL) 150 MG tablet Take 150 mg by mouth at bedtime as needed for sleep.      No current facility-administered medications on file prior to visit.    Allergies  Allergen Reactions  . Shellfish Allergy Anaphylaxis    ALL SEAFOOD ALLERGY  . Ace Inhibitors Other (See Comments)    On MAR  . Angiotensin Receptor Blockers Other (See Comments)    ON MAR  . Lisinopril     Angioedema  . Strawberry Extract Hives  . Robaxin [Methocarbamol] Rash   Social History   Socioeconomic History  . Marital status: Divorced    Spouse name: Not on file  . Number of children: Not on file  . Years of education: Not on file  . Highest education level: Not on file  Social Needs  . Financial resource strain: Not on file  . Food insecurity - worry: Not on file  . Food insecurity - inability: Not on file   . Transportation needs - medical: Not on file  . Transportation needs - non-medical: Not on file  Occupational History  . Not on file  Tobacco Use  . Smoking status: Former Research scientist (life sciences)  . Smokeless tobacco: Never Used  Substance and Sexual Activity  . Alcohol use: No  . Drug use: No  . Sexual activity: No    Comment: lives at [facility].    Other Topics Concern  . Not on file  Social History Narrative  . Not on file      Review of Systems  All other systems reviewed and are negative.      Objective:   Physical Exam  Cardiovascular: Normal rate and regular rhythm.  Pulmonary/Chest: Effort normal and breath sounds normal.  Musculoskeletal:       Right knee: She exhibits abnormal meniscus. She exhibits normal range of motion, no swelling, no effusion, no LCL laxity, normal patellar mobility, no bony tenderness and no MCL laxity. No medial joint line, no lateral joint line, no MCL and no LCL tenderness noted.  Vitals reviewed.         Assessment & Plan:  Knee instability, right - Plan: Ambulatory referral to Orthopedic Surgery  Drug-induced Parkinsonism ([place])  Falls frequently  I believe this is likely multifactorial.  She has osteoarthritis of the right knee and likely a meniscal tear making the knee unstable.  She has her age making her weak.  Her elevated BMI also contributes to her propensity to fall.  I am also concerned that drug-induced parkinsonism and medication side effects may be contributing as well.  Therefore I will decrease the dose of her Trilafon from 6 mg a day to 2 mg a day in the morning in an effort to reduce sedation.  I will discontinue cyclobenzaprine which she is using on an as-needed basis.  I will also discontinue her benztropine.  She will continue Seroquel at night.  Meanwhile I will have her see an orthopedist.  She is a poor surgical candidate but perhaps she would benefit from Visco supplementation injections.  I will reassess the patient in  2 weeks.  Monitor for any mood swings or behavior changes.

## 2017-07-03 DIAGNOSIS — M6281 Muscle weakness (generalized): Secondary | ICD-10-CM | POA: Diagnosis not present

## 2017-07-06 DIAGNOSIS — M6281 Muscle weakness (generalized): Secondary | ICD-10-CM | POA: Diagnosis not present

## 2017-07-10 ENCOUNTER — Other Ambulatory Visit: Payer: Self-pay | Admitting: Family Medicine

## 2017-07-10 DIAGNOSIS — M6281 Muscle weakness (generalized): Secondary | ICD-10-CM | POA: Diagnosis not present

## 2017-07-14 DIAGNOSIS — M6281 Muscle weakness (generalized): Secondary | ICD-10-CM | POA: Diagnosis not present

## 2017-07-15 DIAGNOSIS — M6281 Muscle weakness (generalized): Secondary | ICD-10-CM | POA: Diagnosis not present

## 2017-07-16 ENCOUNTER — Encounter: Payer: Self-pay | Admitting: Family Medicine

## 2017-07-16 ENCOUNTER — Ambulatory Visit (INDEPENDENT_AMBULATORY_CARE_PROVIDER_SITE_OTHER): Payer: Medicare Other | Admitting: Family Medicine

## 2017-07-16 VITALS — BP 160/76 | HR 84 | Temp 98.3°F | Resp 16 | Ht 67.0 in | Wt 216.0 lb

## 2017-07-16 DIAGNOSIS — F209 Schizophrenia, unspecified: Secondary | ICD-10-CM | POA: Diagnosis not present

## 2017-07-16 DIAGNOSIS — R296 Repeated falls: Secondary | ICD-10-CM

## 2017-07-16 DIAGNOSIS — G2119 Other drug induced secondary parkinsonism: Secondary | ICD-10-CM | POA: Diagnosis not present

## 2017-07-16 NOTE — Progress Notes (Signed)
Subjective:    Patient ID: Bonnie Watkins, female    DOB: 1934/12/05, 83 y.o.   MRN: [id number]  HPI  05/30/17 Patient went to the emergency room in November after a fall. X-rays obtained in November revealed tricompartmental arthritis in the right knee. Ever since the fall, patient reports instability in the right knee. She has fallen several times. She states that the right knee simply gives way and will buckle unexpectedly causing her to fall. She states that occasionally the knee will swell. However she denies any pain in the knee. She denies any erythema. There is no effusion today. Her biggest concern is the instability in the knee. However she refuses surgery. Exam today shows no evidence of laxity to varus or valgus stress. Exam is limited by her body habitus and her confinement to a wheelchair but anterior and posterior drawer signs appear negative. She does have pain with Apley grind suggesting a meniscal tear.  At that time, my plan was: I believe this is multifactorial due to age, tricompartmental arthritis of the knee, likely meniscal tear. Exam today is difficult due to body habitus however I appreciate no evidence of an MCL, LCL, PCL, or anterior cruciate ligament tear. We discussed MRI to rule out a tear that would require surgery however the patient states that she does not want any surgery. Therefore I see no reason to proceed with an MRI. She denies any pain. Therefore I will recommend a hinged knee brace whenever the patient is ambulating. Also recommended physical therapy to try to strengthen the muscles the control knee flexion and extension as well as to strengthen the muscles in her right leg to help prevent future falls. If instability is not improving over the next 4-6 weeks, I would recommend an MRI of the right knee  07/02/17 Patient is complying wearing the hinged knee brace.  However she is fallen twice more since I last saw her.  The most recent time was in the  bathroom.  She states that whenever she tries to stand up, she has to put pressure on her right knee with her hands to help push her to a standing position.  The right knee will frequently buckle and she will lose her balance and fall injuring herself.  She is now fallen 3 times in the last 3 months once requiring an emergency room visit.  She is receiving physical therapy at the nursing home but this does not seem to be helping.  She is also on medication for schizophrenia.  She demonstrates tardive dyskinesia.  She also has some mild drug-induced parkinsonism which may be contributing to her lack of balance and falls as well.  There have been no changes in her psychiatric medications in years.  She denies any hallucinations or delusions or delirious behavior.  At that time, my plan was: I believe this is likely multifactorial.  She has osteoarthritis of the right knee and likely a meniscal tear making the knee unstable.  She has her age making her weak.  Her elevated BMI also contributes to her propensity to fall.  I am also concerned that drug-induced parkinsonism and medication side effects may be contributing as well.  Therefore I will decrease the dose of her Trilafon from 6 mg a day to 2 mg a day in the morning in an effort to reduce sedation.  I will discontinue cyclobenzaprine which she is using on an as-needed basis.  I will also discontinue her benztropine.  She will  continue Seroquel at night.  Meanwhile I will have her see an orthopedist.  She is a poor surgical candidate but perhaps she would benefit from Visco supplementation injections.  I will reassess the patient in 2 weeks.  Monitor for any mood swings or behavior changes.  07/16/17 Since decreasing the patient's perphenazine, she's had increased hallucinations. Yesterday she was getting close out of her closet for a dead family member that she told her daughter was in the nursing home with her. She is not violent. She is not delirious. However  she is having more hallucinations. Furthermore the daughter has not appreciated any improvement in her balance although the patient states that she feels more stable on her feet. She denies any depression or suicidal thoughts.  Past Medical History:  Diagnosis Date  . ACE inhibitor-aggravated angioedema   . Dementia   . Depression   . Diabetes mellitus without complication ([place])   . Diverticulosis   . GERD (gastroesophageal reflux disease)   . Hypertension   . Osteoporosis   . Schizophrenia ([place])    No past surgical history on file. Current Outpatient Medications on File Prior to Visit  Medication Sig Dispense Refill  . alendronate (FOSAMAX) 70 MG tablet TAKE 1 TAB EACH WEEK 30 MIN PRIOR TO BREAKFAST WITH LARGE GLASS OF WATER. REMAIN UPRIGHT. 4 tablet 11  . aspirin 81 MG EC tablet TAKE (1) TABLET BY MOUTH ONCE DAILY. 90 tablet 3  . benztropine (COGENTIN) 1 MG tablet Take 1 mg by mouth 2 (two) times daily.    . Calcium Citrate-Vitamin D (CITRACAL PETITES/VITAMIN D) 200-250 MG-UNIT TABS TAKE (2) TABLETS BY MOUTH TWICE DAILY. 120 each 5  . citalopram (CELEXA) 20 MG tablet Take 20 mg by mouth daily.    . cyclobenzaprine (FLEXERIL) 10 MG tablet Take 10 mg by mouth every 8 (eight) hours as needed for muscle spasms.    Marland Kitchen docusate sodium (COLACE) 100 MG capsule TAKE (1) CAPSULE BY MOUTH ONCE DAILY. 30 capsule 11  . GAVILAX powder MIX 1 CAPFUL (17G) IN 8 OUNCES OF JUICE/WATER AND DRINK ONCE DAILY ASNEEDED FOR CONSTIPATION. 510 g 3  . hydrochlorothiazide (HYDRODIURIL) 25 MG tablet TAKE ONE TABLET BY MOUTH ONCE DAILY. 30 tablet 5  . LANTUS 100 UNIT/ML injection INJECT 10 UNITS SUBCUTANEOUSLY EACH MORNING. 10 mL 0  . levothyroxine (SYNTHROID, LEVOTHROID) 50 MCG tablet TAKE ONE TABLET BY MOUTH ONCE DAILY. 30 tablet 5  . memantine (NAMENDA) 10 MG tablet Take 1 tablet (10 mg total) by mouth 2 (two) times daily. 60 tablet 2  . metFORMIN (GLUCOPHAGE) 1000 MG tablet TAKE (1) TABLET BY MOUTH TWICE DAILY.  60 tablet 3  . omeprazole (PRILOSEC) 40 MG capsule TAKE (1) CAPSULE BY MOUTH ONCE DAILY. 30 capsule 11  . perphenazine (TRILAFON) 4 MG tablet Take 6 mg by mouth every morning.     Vladimir Faster Glycol-Propyl Glycol (SYSTANE) 0.4-0.3 % SOLN Place 1 drop into both eyes 2 (two) times daily.    . QUEtiapine (SEROQUEL) 100 MG tablet Take 1 tablet (100 mg total) by mouth at bedtime. 30 tablet 5  . SAFETY-LOK INSULIN SYR 1CC/29G 29G X 1/2" 1 ML MISC USE AS DIRECTED. 100 each 5  . traMADol (ULTRAM) 50 MG tablet TAKE (1) TABLET BY MOUTH TWICE DAILY. 60 tablet 2  . traZODone (DESYREL) 150 MG tablet Take 150 mg by mouth at bedtime as needed for sleep.      No current facility-administered medications on file prior to visit.  Allergies  Allergen Reactions  . Shellfish Allergy Anaphylaxis    ALL SEAFOOD ALLERGY  . Ace Inhibitors Other (See Comments)    On MAR  . Angiotensin Receptor Blockers Other (See Comments)    ON MAR  . Lisinopril     Angioedema  . Strawberry Extract Hives  . Robaxin [Methocarbamol] Rash   Social History   Socioeconomic History  . Marital status: Divorced    Spouse name: Not on file  . Number of children: Not on file  . Years of education: Not on file  . Highest education level: Not on file  Social Needs  . Financial resource strain: Not on file  . Food insecurity - worry: Not on file  . Food insecurity - inability: Not on file  . Transportation needs - medical: Not on file  . Transportation needs - non-medical: Not on file  Occupational History  . Not on file  Tobacco Use  . Smoking status: Former Research scientist (life sciences)  . Smokeless tobacco: Never Used  Substance and Sexual Activity  . Alcohol use: No  . Drug use: No  . Sexual activity: No    Comment: lives at [facility].    Other Topics Concern  . Not on file  Social History Narrative  . Not on file      Review of Systems  All other systems reviewed and are negative.      Objective:   Physical Exam    Cardiovascular: Normal rate and regular rhythm.  Pulmonary/Chest: Effort normal and breath sounds normal.  Musculoskeletal:       Right knee: She exhibits abnormal meniscus. She exhibits normal range of motion, no swelling, no effusion, no LCL laxity, normal patellar mobility, no bony tenderness and no MCL laxity. No medial joint line, no lateral joint line, no MCL and no LCL tenderness noted.  Vitals reviewed.         Assessment & Plan:  Schizophrenia, unspecified type ([place])  Falls frequently  Drug-induced Parkinsonism ([place])  Patient is scheduled to see the orthopedist next week. Daughter believes that the pain in her knee is the biggest cause of her balance. I believe is multifactorial and likely related to diabetic neuropathy, obesity, right knee pain, polypharmacy, advanced age, etc. However decreasing her medication has led to more hallucinations. Therefore I will resume perphenazine 6 mg by mouth every morning and I will resume Cogentin 1 mg a day.

## 2017-07-17 DIAGNOSIS — M6281 Muscle weakness (generalized): Secondary | ICD-10-CM | POA: Diagnosis not present

## 2017-07-21 ENCOUNTER — Encounter (INDEPENDENT_AMBULATORY_CARE_PROVIDER_SITE_OTHER): Payer: Self-pay | Admitting: Orthopaedic Surgery

## 2017-07-21 ENCOUNTER — Ambulatory Visit (INDEPENDENT_AMBULATORY_CARE_PROVIDER_SITE_OTHER): Payer: Medicare Other | Admitting: Orthopaedic Surgery

## 2017-07-21 DIAGNOSIS — M1711 Unilateral primary osteoarthritis, right knee: Secondary | ICD-10-CM | POA: Insufficient documentation

## 2017-07-21 MED ORDER — LIDOCAINE HCL 1 % IJ SOLN
2.0000 mL | INTRAMUSCULAR | Status: AC | PRN
  Administered 2017-07-21: 2 mL

## 2017-07-21 MED ORDER — BUPIVACAINE HCL 0.25 % IJ SOLN
2.0000 mL | INTRAMUSCULAR | Status: AC | PRN
  Administered 2017-07-21: 2 mL via INTRA_ARTICULAR

## 2017-07-21 MED ORDER — METHYLPREDNISOLONE ACETATE 40 MG/ML IJ SUSP
40.0000 mg | INTRAMUSCULAR | Status: AC | PRN
  Administered 2017-07-21: 40 mg via INTRA_ARTICULAR

## 2017-07-21 NOTE — Progress Notes (Signed)
Office Visit Note   Patient: Bonnie Watkins           Date of Birth: 29-May-1934           MRN: [id number] Visit Date: 07/21/2017              Requested by: Susy Frizzle, MD [id number] [place], [place] [address] PCP: Susy Frizzle, MD   Assessment & Plan: Visit Diagnoses:  1. Primary localized osteoarthritis of right knee     Plan: Although [name] is complaining of instability and not pain, feels appropriate to proceed with diagnostic and hopefully therapeutic intra-articular cortisone injection to see if this helps with her instability.  She will continue with outpatient physical therapy in the meantime.  She will follow-up with [place] on an as-needed basis.  Follow-Up Instructions: Return if symptoms worsen or fail to improve.   Orders:  Orders Placed This Encounter  Procedures  . Large Joint Inj: R knee   No orders of the defined types were placed in this encounter.     Procedures: Large Joint Inj: R knee on 07/21/2017 3:28 PM Indications: pain Details: 22 G needle, anterolateral approach Medications: 2 mL lidocaine 1 %; 2 mL bupivacaine 0.25 %; 40 mg methylPREDNISolone acetate 40 MG/ML      Clinical Data: No additional findings.   Subjective: Chief Complaint  Patient presents with  . Right Knee - Pain    HPI Bonnie Watkins is a pleasant 83 year old female who presents our clinic today with right knee instability.  She notes that she has a greater than 10-year history of osteoarthritis to the right knee.  About 10 years ago, she had a Visco supplementation injection.  This helped significantly until about 2 months ago.  She was seen by her primary care provider where she was given tramadol and sent to outpatient physical therapy.  Her pain went away but is now only complaining of instability.  She is unsure what things make this worse.  Review of Systems as detailed in HPI.  All others are negative.   Objective: Vital Signs: There were no  vitals taken for this visit.  Physical Exam well-developed well-nourished female no acute distress.  Alert and oriented x3.  Ortho Exam examination of her right knee reveals a trace effusion.  Range of motion 0-105 degrees.  No joint line tenderness.  Moderate patellofemoral crepitus.  Negative logroll negative straight leg raise.  Specialty Comments:  No specialty comments available.  Imaging: Images reviewed by me and canopy reveal severe tricompartmental degenerative changes   PMFS History: Patient Active Problem List   Diagnosis Date Noted  . Primary localized osteoarthritis of right knee 07/21/2017  . First degree AV block 11/23/2013  . Diabetes mellitus without complication ([place])   . Schizophrenia ([place])   . Hypertension   . Dementia   . Depression   . ACE inhibitor-aggravated angioedema   . Diverticulosis    Past Medical History:  Diagnosis Date  . ACE inhibitor-aggravated angioedema   . Dementia   . Depression   . Diabetes mellitus without complication ([place])   . Diverticulosis   . GERD (gastroesophageal reflux disease)   . Hypertension   . Osteoporosis   . Schizophrenia ([place])     History reviewed. No pertinent family history.  History reviewed. No pertinent surgical history. Social History   Occupational History  . Not on file  Tobacco Use  . Smoking status: Former Research scientist (life sciences)  . Smokeless tobacco: Never  Used  Substance and Sexual Activity  . Alcohol use: No  . Drug use: No  . Sexual activity: No    Comment: lives at [facility].

## 2017-07-22 DIAGNOSIS — M6281 Muscle weakness (generalized): Secondary | ICD-10-CM | POA: Diagnosis not present

## 2017-07-24 DIAGNOSIS — M6281 Muscle weakness (generalized): Secondary | ICD-10-CM | POA: Diagnosis not present

## 2017-07-29 DIAGNOSIS — M6281 Muscle weakness (generalized): Secondary | ICD-10-CM | POA: Diagnosis not present

## 2017-07-30 DIAGNOSIS — M6281 Muscle weakness (generalized): Secondary | ICD-10-CM | POA: Diagnosis not present

## 2017-08-03 DIAGNOSIS — M6281 Muscle weakness (generalized): Secondary | ICD-10-CM | POA: Diagnosis not present

## 2017-08-05 DIAGNOSIS — M6281 Muscle weakness (generalized): Secondary | ICD-10-CM | POA: Diagnosis not present

## 2017-08-07 DIAGNOSIS — M6281 Muscle weakness (generalized): Secondary | ICD-10-CM | POA: Diagnosis not present

## 2017-08-10 DIAGNOSIS — M6281 Muscle weakness (generalized): Secondary | ICD-10-CM | POA: Diagnosis not present

## 2017-08-12 DIAGNOSIS — M6281 Muscle weakness (generalized): Secondary | ICD-10-CM | POA: Diagnosis not present

## 2017-08-14 DIAGNOSIS — M6281 Muscle weakness (generalized): Secondary | ICD-10-CM | POA: Diagnosis not present

## 2017-08-17 DIAGNOSIS — M6281 Muscle weakness (generalized): Secondary | ICD-10-CM | POA: Diagnosis not present

## 2017-08-18 DIAGNOSIS — M6281 Muscle weakness (generalized): Secondary | ICD-10-CM | POA: Diagnosis not present

## 2017-08-20 DIAGNOSIS — M6281 Muscle weakness (generalized): Secondary | ICD-10-CM | POA: Diagnosis not present

## 2017-08-24 ENCOUNTER — Other Ambulatory Visit: Payer: Self-pay | Admitting: Family Medicine

## 2017-08-25 DIAGNOSIS — M6281 Muscle weakness (generalized): Secondary | ICD-10-CM | POA: Diagnosis not present

## 2017-08-26 DIAGNOSIS — M6281 Muscle weakness (generalized): Secondary | ICD-10-CM | POA: Diagnosis not present

## 2017-08-28 DIAGNOSIS — M6281 Muscle weakness (generalized): Secondary | ICD-10-CM | POA: Diagnosis not present

## 2017-08-31 DIAGNOSIS — M6281 Muscle weakness (generalized): Secondary | ICD-10-CM | POA: Diagnosis not present

## 2017-09-02 DIAGNOSIS — M6281 Muscle weakness (generalized): Secondary | ICD-10-CM | POA: Diagnosis not present

## 2017-09-04 DIAGNOSIS — M6281 Muscle weakness (generalized): Secondary | ICD-10-CM | POA: Diagnosis not present

## 2017-09-08 DIAGNOSIS — M6281 Muscle weakness (generalized): Secondary | ICD-10-CM | POA: Diagnosis not present

## 2017-09-11 DIAGNOSIS — M6281 Muscle weakness (generalized): Secondary | ICD-10-CM | POA: Diagnosis not present

## 2017-09-14 DIAGNOSIS — M6281 Muscle weakness (generalized): Secondary | ICD-10-CM | POA: Diagnosis not present

## 2017-09-15 ENCOUNTER — Encounter (HOSPITAL_COMMUNITY): Payer: Self-pay | Admitting: Emergency Medicine

## 2017-09-15 ENCOUNTER — Observation Stay (HOSPITAL_COMMUNITY)
Admission: EM | Admit: 2017-09-15 | Discharge: 2017-09-17 | Payer: Medicare Other | Attending: Family Medicine | Admitting: Family Medicine

## 2017-09-15 ENCOUNTER — Emergency Department (HOSPITAL_COMMUNITY): Payer: Medicare Other

## 2017-09-15 ENCOUNTER — Other Ambulatory Visit: Payer: Self-pay

## 2017-09-15 DIAGNOSIS — K219 Gastro-esophageal reflux disease without esophagitis: Secondary | ICD-10-CM | POA: Diagnosis not present

## 2017-09-15 DIAGNOSIS — Z79899 Other long term (current) drug therapy: Secondary | ICD-10-CM | POA: Diagnosis not present

## 2017-09-15 DIAGNOSIS — F0390 Unspecified dementia without behavioral disturbance: Secondary | ICD-10-CM | POA: Diagnosis not present

## 2017-09-15 DIAGNOSIS — Z87891 Personal history of nicotine dependence: Secondary | ICD-10-CM | POA: Insufficient documentation

## 2017-09-15 DIAGNOSIS — Z888 Allergy status to other drugs, medicaments and biological substances status: Secondary | ICD-10-CM | POA: Insufficient documentation

## 2017-09-15 DIAGNOSIS — E039 Hypothyroidism, unspecified: Secondary | ICD-10-CM | POA: Diagnosis not present

## 2017-09-15 DIAGNOSIS — I10 Essential (primary) hypertension: Secondary | ICD-10-CM | POA: Diagnosis not present

## 2017-09-15 DIAGNOSIS — F209 Schizophrenia, unspecified: Secondary | ICD-10-CM | POA: Diagnosis not present

## 2017-09-15 DIAGNOSIS — Z794 Long term (current) use of insulin: Secondary | ICD-10-CM | POA: Diagnosis not present

## 2017-09-15 DIAGNOSIS — E1165 Type 2 diabetes mellitus with hyperglycemia: Secondary | ICD-10-CM | POA: Insufficient documentation

## 2017-09-15 DIAGNOSIS — R110 Nausea: Secondary | ICD-10-CM | POA: Diagnosis present

## 2017-09-15 DIAGNOSIS — R0789 Other chest pain: Principal | ICD-10-CM | POA: Insufficient documentation

## 2017-09-15 DIAGNOSIS — Z7982 Long term (current) use of aspirin: Secondary | ICD-10-CM | POA: Insufficient documentation

## 2017-09-15 DIAGNOSIS — F329 Major depressive disorder, single episode, unspecified: Secondary | ICD-10-CM | POA: Diagnosis not present

## 2017-09-15 DIAGNOSIS — I209 Angina pectoris, unspecified: Secondary | ICD-10-CM

## 2017-09-15 DIAGNOSIS — R079 Chest pain, unspecified: Secondary | ICD-10-CM

## 2017-09-15 DIAGNOSIS — E119 Type 2 diabetes mellitus without complications: Secondary | ICD-10-CM

## 2017-09-15 DIAGNOSIS — E871 Hypo-osmolality and hyponatremia: Secondary | ICD-10-CM | POA: Insufficient documentation

## 2017-09-15 DIAGNOSIS — R0602 Shortness of breath: Secondary | ICD-10-CM | POA: Diagnosis not present

## 2017-09-15 LAB — CBC
HCT: 37.2 % (ref 36.0–46.0)
HEMATOCRIT: 40.6 % (ref 36.0–46.0)
HEMOGLOBIN: 12.7 g/dL (ref 12.0–15.0)
Hemoglobin: 13.7 g/dL (ref 12.0–15.0)
MCH: 30.6 pg (ref 26.0–34.0)
MCH: 30.9 pg (ref 26.0–34.0)
MCHC: 33.7 g/dL (ref 30.0–36.0)
MCHC: 34.1 g/dL (ref 30.0–36.0)
MCV: 90.8 fL (ref 78.0–100.0)
MCV: 90.5 fL (ref 78.0–100.0)
PLATELETS: 193 10*3/uL (ref 150–400)
Platelets: 210 10*3/uL (ref 150–400)
RBC: 4.47 MIL/uL (ref 3.87–5.11)
RBC: 4.11 MIL/uL (ref 3.87–5.11)
RDW: 13.6 % (ref 11.5–15.5)
RDW: 13.7 % (ref 11.5–15.5)
WBC: 11.2 10*3/uL — ABNORMAL HIGH (ref 4.0–10.5)
WBC: 9.7 10*3/uL (ref 4.0–10.5)

## 2017-09-15 LAB — CREATININE, SERUM
CREATININE: 0.62 mg/dL (ref 0.44–1.00)
GFR calc Af Amer: >60 mL/min (ref >60)
GFR calc non Af Amer: >60 mL/min (ref >60)

## 2017-09-15 LAB — BASIC METABOLIC PANEL
Anion gap: 12 (ref 5–15)
BUN: 11 mg/dL (ref 6–20)
CHLORIDE: 88 mmol/L — AB (ref 101–111)
CO2: 26 mmol/L (ref 22–32)
CREATININE: 0.65 mg/dL (ref 0.44–1.00)
Calcium: 9.1 mg/dL (ref 8.9–10.3)
GFR calc Af Amer: >60 mL/min (ref >60)
GFR calc non Af Amer: >60 mL/min (ref >60)
Glucose, Bld: 190 mg/dL — ABNORMAL HIGH (ref 65–99)
POTASSIUM: 4.2 mmol/L (ref 3.5–5.1)
SODIUM: 126 mmol/L — AB (ref 135–145)

## 2017-09-15 LAB — CBG MONITORING, ED
GLUCOSE-CAPILLARY: 160 mg/dL — AB (ref 65–99)
Glucose-Capillary: 162 mg/dL — ABNORMAL HIGH (ref 65–99)

## 2017-09-15 LAB — I-STAT TROPONIN, ED: Troponin i, poc: 0.02 ng/mL (ref 0.00–0.08)

## 2017-09-15 LAB — TROPONIN I: Troponin I: <0.03 ng/mL (ref <0.03)

## 2017-09-15 LAB — D-DIMER, QUANTITATIVE (NOT AT ARMC): D DIMER QUANT: 6.52 ug{FEU}/mL — AB (ref 0.00–0.50)

## 2017-09-15 MED ORDER — TRAMADOL HCL 50 MG PO TABS: 50.0000 mg | ORAL_TABLET | Freq: Two times a day (BID) | ORAL | Status: DC | PRN

## 2017-09-15 MED ORDER — PERPHENAZINE 2 MG PO TABS
5.0000 mg | ORAL_TABLET | ORAL | Status: DC
  Administered 2017-09-16: 5 mg via ORAL
  Filled 2017-09-15 (×2): qty 1

## 2017-09-15 MED ORDER — QUETIAPINE FUMARATE 25 MG PO TABS
100.0000 mg | ORAL_TABLET | Freq: Every day | ORAL | Status: DC
  Administered 2017-09-15 – 2017-09-16 (×2): 100 mg via ORAL
  Filled 2017-09-15: qty 4
  Filled 2017-09-15: qty 1

## 2017-09-15 MED ORDER — ONDANSETRON HCL 4 MG/2ML IJ SOLN: 4.0000 mg | Freq: Four times a day (QID) | INTRAMUSCULAR | Status: DC | PRN

## 2017-09-15 MED ORDER — PANTOPRAZOLE SODIUM 40 MG PO TBEC
40.0000 mg | DELAYED_RELEASE_TABLET | Freq: Every day | ORAL | Status: DC
  Administered 2017-09-16 – 2017-09-17 (×2): 40 mg via ORAL
  Filled 2017-09-15 (×2): qty 1

## 2017-09-15 MED ORDER — INSULIN GLARGINE 100 UNIT/ML ~~LOC~~ SOLN
5.0000 [IU] | Freq: Every day | SUBCUTANEOUS | Status: DC
  Administered 2017-09-16: 5 [IU] via SUBCUTANEOUS
  Filled 2017-09-15 (×2): qty 0.05

## 2017-09-15 MED ORDER — HYDRALAZINE HCL 20 MG/ML IJ SOLN: 10.0000 mg | INTRAMUSCULAR | Status: DC | PRN

## 2017-09-15 MED ORDER — ASPIRIN EC 325 MG PO TBEC
325.0000 mg | DELAYED_RELEASE_TABLET | Freq: Every day | ORAL | Status: DC
  Administered 2017-09-16: 325 mg via ORAL
  Filled 2017-09-15: qty 1

## 2017-09-15 MED ORDER — BENZTROPINE MESYLATE 1 MG PO TABS
1.0000 mg | ORAL_TABLET | Freq: Every day | ORAL | Status: DC
  Administered 2017-09-16: 1 mg via ORAL
  Filled 2017-09-15 (×2): qty 1

## 2017-09-15 MED ORDER — LEVOTHYROXINE SODIUM 50 MCG PO TABS
50.0000 ug | ORAL_TABLET | Freq: Every day | ORAL | Status: DC
  Administered 2017-09-16 – 2017-09-17 (×2): 50 ug via ORAL
  Filled 2017-09-15 (×2): qty 1

## 2017-09-15 MED ORDER — ACETAMINOPHEN 325 MG PO TABS: 650.0000 mg | ORAL_TABLET | ORAL | Status: DC | PRN

## 2017-09-15 MED ORDER — ASPIRIN EC 325 MG PO TBEC: 325.0000 mg | DELAYED_RELEASE_TABLET | Freq: Every day | ORAL | Status: DC

## 2017-09-15 MED ORDER — NITROGLYCERIN 0.4 MG SL SUBL: 0.4000 mg | SUBLINGUAL_TABLET | SUBLINGUAL | Status: DC | PRN

## 2017-09-15 MED ORDER — ASPIRIN 81 MG PO CHEW: 324.0000 mg | CHEWABLE_TABLET | Freq: Once | ORAL | Status: DC

## 2017-09-15 MED ORDER — ENOXAPARIN SODIUM 40 MG/0.4ML ~~LOC~~ SOLN
40.0000 mg | SUBCUTANEOUS | Status: DC
  Administered 2017-09-16 – 2017-09-17 (×2): 40 mg via SUBCUTANEOUS
  Filled 2017-09-15 (×2): qty 0.4

## 2017-09-15 MED ORDER — MEMANTINE HCL 10 MG PO TABS
10.0000 mg | ORAL_TABLET | Freq: Two times a day (BID) | ORAL | Status: DC
  Administered 2017-09-15 – 2017-09-17 (×4): 10 mg via ORAL
  Filled 2017-09-15 (×4): qty 1

## 2017-09-15 MED ORDER — INSULIN ASPART 100 UNIT/ML ~~LOC~~ SOLN
0.0000 [IU] | Freq: Three times a day (TID) | SUBCUTANEOUS | Status: DC
  Filled 2017-09-15: qty 1

## 2017-09-15 MED ORDER — CITALOPRAM HYDROBROMIDE 20 MG PO TABS
20.0000 mg | ORAL_TABLET | Freq: Every day | ORAL | Status: DC
  Administered 2017-09-16 – 2017-09-17 (×2): 20 mg via ORAL
  Filled 2017-09-15 (×2): qty 1

## 2017-09-15 NOTE — ED Notes (Signed)
Pt resting quietly at this time with no complaints.  Family at bedside

## 2017-09-15 NOTE — ED Provider Notes (Signed)
[facility] EMERGENCY DEPARTMENT Provider Note   CSN: [id number] Arrival date & time: 09/15/17  1424     History   Chief Complaint Chief Complaint  Patient presents with  . Chest Pain    HPI Bonnie Watkins is a 83 y.o. female.  HPI 83 year old comes in with chief complaint of chest pain.  Patient reports that after lunch she started having sudden onset of midsternal chest pain which was nonradiating, with associated nausea, shortness of breath and diaphoresis.  EMS was called by the nursing home and patient states that her pain resolved once she was given nitroglycerin.  Patient has history of hypertension, diabetes.  She has no known history of CAD and denies any similar symptoms in the past.  Past Medical History:  Diagnosis Date  . ACE inhibitor-aggravated angioedema   . Dementia   . Depression   . Diabetes mellitus without complication ([place])   . Diverticulosis   . GERD (gastroesophageal reflux disease)   . Hypertension   . Osteoporosis   . Schizophrenia [facility])     Patient Active Problem List   Diagnosis Date Noted  . Primary localized osteoarthritis of right knee 07/21/2017  . First degree AV block 11/23/2013  . Diabetes mellitus without complication ([place])   . Schizophrenia ([place])   . Hypertension   . Dementia   . Depression   . ACE inhibitor-aggravated angioedema   . Diverticulosis     No past surgical history on file.   OB History   None      Home Medications    Prior to Admission medications   Medication Sig Start Date End Date Taking? Authorizing Provider  alendronate (FOSAMAX) 70 MG tablet TAKE 1 TAB EACH WEEK 30 MIN PRIOR TO BREAKFAST WITH LARGE GLASS OF WATER. REMAIN UPRIGHT. 11/17/16   Susy Frizzle, MD  aspirin 81 MG EC tablet TAKE (1) TABLET BY MOUTH ONCE DAILY. 05/19/17   Susy Frizzle, MD  benztropine (COGENTIN) 1 MG tablet Take 1 mg by mouth 2 (two) times daily.    [provider]  Calcium  Citrate-Vitamin D (CITRACAL PETITES/VITAMIN D) 200-250 MG-UNIT TABS TAKE (2) TABLETS BY MOUTH TWICE DAILY. 12/31/16   Susy Frizzle, MD  citalopram (CELEXA) 20 MG tablet Take 20 mg by mouth daily.    [provider]  cyclobenzaprine (FLEXERIL) 10 MG tablet Take 10 mg by mouth every 8 (eight) hours as needed for muscle spasms.    [provider]  docusate sodium (COLACE) 100 MG capsule TAKE (1) CAPSULE BY MOUTH ONCE DAILY. 01/01/17   Susy Frizzle, MD  GAVILAX powder MIX 1 CAPFUL (17G) IN 8 OUNCES OF JUICE/WATER AND DRINK ONCE DAILY ASNEEDED FOR CONSTIPATION. 06/03/17   Susy Frizzle, MD  hydrochlorothiazide (HYDRODIURIL) 25 MG tablet TAKE ONE TABLET BY MOUTH ONCE DAILY. 11/18/16   Susy Frizzle, MD  LANTUS 100 UNIT/ML injection INJECT 10 UNITS SUBCUTANEOUSLY EACH MORNING. 08/24/17   Susy Frizzle, MD  levothyroxine (SYNTHROID, LEVOTHROID) 50 MCG tablet TAKE ONE TABLET BY MOUTH ONCE DAILY. 11/19/16   Susy Frizzle, MD  memantine (NAMENDA) 10 MG tablet Take 1 tablet (10 mg total) by mouth 2 (two) times daily. 09/21/12   Susy Frizzle, MD  metFORMIN (GLUCOPHAGE) 1000 MG tablet TAKE (1) TABLET BY MOUTH TWICE DAILY. 11/18/16   Susy Frizzle, MD  omeprazole (PRILOSEC) 40 MG capsule TAKE (1) CAPSULE BY MOUTH ONCE DAILY. 12/31/16   Susy Frizzle, MD  perphenazine (TRILAFON) 4 MG tablet Take 2 mg by mouth every morning.     [provider]  Polyethyl Glycol-Propyl Glycol (SYSTANE) 0.4-0.3 % SOLN Place 1 drop into both eyes 2 (two) times daily.    [provider]  QUEtiapine (SEROQUEL) 100 MG tablet Take 1 tablet (100 mg total) by mouth at bedtime. 12/07/15   Susy Frizzle, MD  SAFETY-LOK INSULIN SYR 1CC/29G 29G X 1/2" 1 ML MISC USE AS DIRECTED. 12/31/16   Susy Frizzle, MD  traMADol (ULTRAM) 50 MG tablet TAKE (1) TABLET BY MOUTH TWICE DAILY. 01/16/17   Susy Frizzle, MD  traZODone (DESYREL) 150 MG tablet Take 150 mg by mouth at bedtime as  needed for sleep.     [provider]    Family History No family history on file.  Social History Social History   Tobacco Use  . Smoking status: Former Research scientist (life sciences)  . Smokeless tobacco: Never Used  Substance Use Topics  . Alcohol use: No  . Drug use: No     Allergies   Shellfish allergy; Ace inhibitors; Angiotensin receptor blockers; Lisinopril; Strawberry extract; and Robaxin [methocarbamol]   Review of Systems Review of Systems  Constitutional: Positive for activity change and diaphoresis.  Respiratory: Positive for shortness of breath.   Cardiovascular: Positive for chest pain.  All other systems reviewed and are negative.    Physical Exam Updated Vital Signs BP (!) 201/103   Pulse 87   Temp 97.9 F (36.6 C) (Oral)   Resp (!) 25   SpO2 95%   Physical Exam  Constitutional: She is oriented to person, place, and time. She appears well-developed.  HENT:  Head: Normocephalic and atraumatic.  Eyes: EOM are normal.  Neck: Normal range of motion. Neck supple.  Cardiovascular: Normal rate.  Pulmonary/Chest: Effort normal.  Abdominal: Bowel sounds are normal.  Neurological: She is alert and oriented to person, place, and time.  Skin: Skin is warm and dry.  Nursing note and vitals reviewed.    ED Treatments / Results  Labs (all labs ordered are listed, but only abnormal results are displayed) Labs Reviewed  BASIC METABOLIC PANEL  CBC  TROPONIN I  TROPONIN I  I-STAT TROPONIN, ED  CBG MONITORING, ED    EKG EKG Interpretation  Date/Time:  Tuesday Sep 15 2017 14:26:36 EDT Ventricular Rate:  86 PR Interval:    QRS Duration: 102 QT Interval:  420 QTC Calculation: 503 R Axis:   -8 Text Interpretation:  Sinus rhythm Prolonged PR interval Posterior infarct, old Prolonged QT interval No acute changes Nonspecific ST and T wave abnormality Confirmed by Varney Biles ([id number]) on 09/15/2017 2:51:55 PM   Radiology Dg Chest 2 View  Result Date:  09/15/2017 CLINICAL DATA:  Left-sided chest pressure. Diabetes. Hyperglycemic. EXAM: CHEST - 2 VIEW COMPARISON:  None. FINDINGS: Heart size is normal. No edema or effusion. No focal airspace disease present. Chronic advanced degenerative changes are present in right shoulder. IMPRESSION: No active cardiopulmonary disease. Electronically Signed   By: San Morelle M.D.   On: 09/15/2017 15:28    Procedures Procedures (including critical care time)  Medications Ordered in ED Medications  aspirin chewable tablet 324 mg (324 mg Oral Not Given 09/15/17 1459)     Initial Impression / Assessment and Plan / ED Course  I have reviewed the triage vital signs and the nursing notes.  Pertinent labs & imaging results that were available during my care of the patient were reviewed by me and considered  in my medical decision making (see chart for details).     Differential diagnosis includes: ACS syndrome Pericardial effusion / tamponade pericarditis Pneumonia Pleural effusion / Pulmonary edema PE Pneumothorax Musculoskeletal pain PUD / Gastritis / Esophagitis Esophageal spasm   83 year old female comes in with chief complaint of chest pain.  Patient had sudden onset of midsternal chest pain with associated nausea, diaphoresis and shortness of breath.  Patient's symptoms resolved after she received nitroglycerin per EMS.  Patient has no known CAD history however she has multiple risk factors.  EKG has no acute findings.  We will admit patient for ACS rule out, which appears to be most likely event.  Other consideration includes dysrhythmia and esophageal spasms.    Final Clinical Impressions(s) / ED Diagnoses   Final diagnoses:  Angina pectoris [facility])    ED Discharge Orders    None       Varney Biles, MD 09/15/17 [phone]

## 2017-09-15 NOTE — ED Notes (Signed)
Pt given [place]

## 2017-09-15 NOTE — ED Triage Notes (Signed)
Patient arrived from [facility], reports of central chest pain after lunch. EMS reports that she was diaphoretic on the ride here. EMS gave 324mg  ASA, 0.4 Nitro X1 and 4mg  Zofran  On arrival patient reports chest pain 5 on a 0-10scale-Not radiating

## 2017-09-15 NOTE — ED Notes (Signed)
RN attempted to pull back labs on IV but would not draw. RN attempted IV start twice.

## 2017-09-15 NOTE — ED Notes (Signed)
Vicente Males (Daughter) [phone]- cell, if no answer please leave a message. Please call and let daughter know what room the patient is being transferred to

## 2017-09-15 NOTE — ED Notes (Signed)
Patient transported to X-ray 

## 2017-09-15 NOTE — ED Notes (Signed)
Purewick placed on pt. 

## 2017-09-15 NOTE — H&P (Addendum)
History and Physical    [name] WNU:[id number] DOB: Jan 23, 1935 DOA: 09/15/2017  PCP: Susy Frizzle, MD  Patient coming from: Assisted living facility.  Chief Complaint: Chest pain.  HPI: Bonnie Watkins is a 83 y.o. female with history of diabetes mellitus type 2, hypertension, hypothyroidism, schizophrenia and depression was brought to the ER after patient started experiencing chest pain.  Patient chest pain happened after patient had lunch.  Patient initially felt mildly dizzy nauseous diaphoretic and started developing chest pain.  Chest pain was substernal nonradiating had mild shortness of breath associated with it.  Denies any vomiting or abdominal pain.  EMS was called and patient was given sublingual nitroglycerin following which chest pain resolved.  ED Course: In the ER patient is chest pain-free.  Troponin was negative chest x-ray was unremarkable.  EKG shows nonspecific findings.  Blood pressure initially was mildly elevated improved.  Labs show mild hyponatremia.  Patient admitted for further management of chest pain.  Review of Systems: As per HPI, rest all negative.   Past Medical History:  Diagnosis Date  . ACE inhibitor-aggravated angioedema   . Dementia   . Depression   . Diabetes mellitus without complication ([place])   . Diverticulosis   . GERD (gastroesophageal reflux disease)   . Hypertension   . Osteoporosis   . Schizophrenia ([place])     History reviewed. No pertinent surgical history.   reports that she has quit smoking. She has never used smokeless tobacco. She reports that she does not drink alcohol or use drugs.  Allergies  Allergen Reactions  . Shellfish Allergy Anaphylaxis    ALL SEAFOOD ALLERGY  . Ace Inhibitors Other (See Comments)    On MAR  . Angiotensin Receptor Blockers Other (See Comments)    ON MAR  . Lisinopril     Angioedema  . Strawberry Extract Hives  . Robaxin [Methocarbamol] Rash    History reviewed.  No pertinent family history.  Prior to Admission medications   Medication Sig Start Date End Date Taking? Authorizing Provider  alendronate (FOSAMAX) 70 MG tablet TAKE 1 TAB EACH WEEK 30 MIN PRIOR TO BREAKFAST WITH LARGE GLASS OF WATER. REMAIN UPRIGHT. 11/17/16  Yes Susy Frizzle, MD  benztropine (COGENTIN) 1 MG tablet Take 1 mg by mouth daily.    Yes [provider]  Calcium Citrate-Vitamin D (CITRACAL PETITES/VITAMIN D) 200-250 MG-UNIT TABS TAKE (2) TABLETS BY MOUTH TWICE DAILY. 12/31/16  Yes Susy Frizzle, MD  citalopram (CELEXA) 20 MG tablet Take 20 mg by mouth daily.   Yes [provider]  hydrochlorothiazide (HYDRODIURIL) 25 MG tablet TAKE ONE TABLET BY MOUTH ONCE DAILY. 11/18/16  Yes Susy Frizzle, MD  LANTUS 100 UNIT/ML injection INJECT 10 UNITS SUBCUTANEOUSLY EACH MORNING. 08/24/17  Yes Susy Frizzle, MD  levothyroxine (SYNTHROID, LEVOTHROID) 50 MCG tablet TAKE ONE TABLET BY MOUTH ONCE DAILY. 11/19/16  Yes Susy Frizzle, MD  memantine (NAMENDA) 10 MG tablet Take 1 tablet (10 mg total) by mouth 2 (two) times daily. 09/21/12  Yes Susy Frizzle, MD  metFORMIN (GLUCOPHAGE) 1000 MG tablet TAKE (1) TABLET BY MOUTH TWICE DAILY. 11/18/16  Yes Susy Frizzle, MD  omeprazole (PRILOSEC) 40 MG capsule TAKE (1) CAPSULE BY MOUTH ONCE DAILY. 12/31/16  Yes Susy Frizzle, MD  perphenazine (TRILAFON) 2 MG tablet Take 5 mg by mouth every morning.    Yes [provider]  Polyethyl Glycol-Propyl Glycol (SYSTANE) 0.4-0.3 % SOLN Place 1 drop  into both eyes 2 (two) times daily.   Yes [provider]  QUEtiapine (SEROQUEL) 100 MG tablet Take 1 tablet (100 mg total) by mouth at bedtime. 12/07/15  Yes Susy Frizzle, MD  traMADol (ULTRAM) 50 MG tablet TAKE (1) TABLET BY MOUTH TWICE DAILY. 01/16/17  Yes Susy Frizzle, MD  aspirin 81 MG EC tablet TAKE (1) TABLET BY MOUTH ONCE DAILY. 05/19/17   Susy Frizzle, MD  docusate sodium (COLACE) 100 MG capsule  TAKE (1) CAPSULE BY MOUTH ONCE DAILY. Patient not taking: Reported on 09/15/2017 01/01/17   Susy Frizzle, MD  GAVILAX powder MIX 1 CAPFUL (17G) IN 8 OUNCES OF JUICE/WATER AND DRINK ONCE DAILY ASNEEDED FOR CONSTIPATION. Patient not taking: Reported on 09/15/2017 06/03/17   Susy Frizzle, MD  SAFETY-LOK INSULIN SYR 1CC/29G 29G X 1/2" 1 ML MISC USE AS DIRECTED. 12/31/16   Susy Frizzle, MD    Physical Exam: Vitals:   09/15/17 1915 09/15/17 1930 09/15/17 1945 09/15/17 2000  BP: (!) 177/79 (!) 171/80 (!) 175/74 (!) 168/83  Pulse: 89 78 83 82  Resp: (!) 21 17 17 19   Temp:      TempSrc:      SpO2: 97% 96% 97% 92%      Constitutional: Moderately built and nourished. Vitals:   09/15/17 1915 09/15/17 1930 09/15/17 1945 09/15/17 2000  BP: (!) 177/79 (!) 171/80 (!) 175/74 (!) 168/83  Pulse: 89 78 83 82  Resp: (!) 21 17 17 19   Temp:      TempSrc:      SpO2: 97% 96% 97% 92%   Eyes: Anicteric no pallor. ENMT: No discharge from the ears eyes nose or mouth. Neck: No mass felt.  No neck rigidity. Respiratory: No rhonchi or crepitations. Cardiovascular: S1-S2 heard no murmurs appreciated. Abdomen: Soft nontender bowel sounds present. Musculoskeletal: No edema.  No joint effusion. Skin: No rash.  Skin appears warm. Neurologic: Alert awake oriented to time place and person.  Moves all extremities. Psychiatric: Appears normal.  Normal affect.   Labs on Admission: I have personally reviewed following labs and imaging studies  CBC: Recent Labs  Lab 09/15/17 1556  WBC 11.2*  HGB 13.7  HCT 40.6  MCV 90.8  PLT 660   Basic Metabolic Panel: Recent Labs  Lab 09/15/17 1556  NA 126*  K 4.2  CL 88*  CO2 26  GLUCOSE 190*  BUN 11  CREATININE 0.65  CALCIUM 9.1   GFR: CrCl cannot be calculated (Unknown ideal weight.). Liver Function Tests: No results for input(s): AST, ALT, ALKPHOS, BILITOT, PROT, ALBUMIN in the last 168 hours. No results for input(s): LIPASE, AMYLASE in the  last 168 hours. No results for input(s): AMMONIA in the last 168 hours. Coagulation Profile: No results for input(s): INR, PROTIME in the last 168 hours. Cardiac Enzymes: Recent Labs  Lab 09/15/17 1556  TROPONINI <0.03   BNP (last 3 results) No results for input(s): PROBNP in the last 8760 hours. HbA1C: No results for input(s): HGBA1C in the last 72 hours. CBG: Recent Labs  Lab 09/15/17 1823  GLUCAP 162*   Lipid Profile: No results for input(s): CHOL, HDL, LDLCALC, TRIG, CHOLHDL, LDLDIRECT in the last 72 hours. Thyroid Function Tests: No results for input(s): TSH, T4TOTAL, FREET4, T3FREE, THYROIDAB in the last 72 hours. Anemia Panel: No results for input(s): VITAMINB12, FOLATE, FERRITIN, TIBC, IRON, RETICCTPCT in the last 72 hours. Urine analysis:    Component Value Date/Time   COLORURINE YELLOW 10/26/2013 0437  APPEARANCEUR CLEAR 10/26/2013 0437   LABSPEC 1.008 10/26/2013 0437   PHURINE 6.5 10/26/2013 0437   GLUCOSEU NEGATIVE 10/26/2013 0437   HGBUR NEGATIVE 10/26/2013 0437   BILIRUBINUR NEGATIVE 10/26/2013 0437   KETONESUR 15 (A) 10/26/2013 0437   PROTEINUR NEGATIVE 10/26/2013 0437   UROBILINOGEN 0.2 10/26/2013 0437   NITRITE NEGATIVE 10/26/2013 0437   LEUKOCYTESUR SMALL (A) 10/26/2013 0437   Sepsis Labs: @LABRCNTIP (procalcitonin:4,lacticidven:4) )No results found for this or any previous visit (from the past 240 hour(s)).   Radiological Exams on Admission: Dg Chest 2 View  Result Date: 09/15/2017 CLINICAL DATA:  Left-sided chest pressure. Diabetes. Hyperglycemic. EXAM: CHEST - 2 VIEW COMPARISON:  None. FINDINGS: Heart size is normal. No edema or effusion. No focal airspace disease present. Chronic advanced degenerative changes are present in right shoulder. IMPRESSION: No active cardiopulmonary disease. Electronically Signed   By: San Morelle M.D.   On: 09/15/2017 15:28    EKG: Independently reviewed.  Normal sinus rhythm with nonspecific ST-T changes.   QTC is 503 ms.  Assessment/Plan Principal Problem:   Chest pain Active Problems:   Diabetes mellitus without complication ([facility])   Schizophrenia ([facility])   Hypertension    1. Chest pain -cause not clear.  Symptoms improved with sublingual nitroglycerin.  Patient had mild nausea and diaphoresis and happen after eating we will check LFTs lipase and also sonogram of the abdomen.  Cycle cardiac markers check d-dimer.  Check 2D echo.  PRN nitroglycerin and 25 cardiology. 2. Hypertension uncontrolled -holding hydrochlorothiazide due to mild hyponatremia.  Will keep patient on PRN IV hydralazine for now.  Closely follow blood pressure trends. 3. Mild hyponatremia -hold hydrochlorothiazide and closely follow metabolic panel.  Check urine studies and TSH. 4. Diabetes mellitus type 2 -on metformin while inpatient I have decrease patient's Lantus dose from 10units to 5 units.  Closely follow CBGs. 5. History of schizophrenia and depression on Celexa, Cogentin, perphenazine, Seroquel.  Not the patient has prolonged QTC. 6. Prolonged QTC -follow metabolic panel and magnesium levels. 7. Hypothyroidism on Synthroid.  Check TSH.  LFTs and lipase and sonogram of the abdomen are pending.   DVT prophylaxis: Lovenox. Code Status: Full code. Family Communication: Patient's daughter. Disposition Plan: Home. Consults called: Cardiology. Admission status: Observation.   Rise Patience MD [facility] Pager [phone].  If 7PM-7AM, please contact night-coverage [email or web address] Password [email or web address]  09/15/2017, 9:00 PM

## 2017-09-15 NOTE — ED Notes (Signed)
tele

## 2017-09-16 ENCOUNTER — Observation Stay (HOSPITAL_COMMUNITY): Payer: Medicare Other

## 2017-09-16 ENCOUNTER — Observation Stay (HOSPITAL_BASED_OUTPATIENT_CLINIC_OR_DEPARTMENT_OTHER): Payer: Medicare Other

## 2017-09-16 DIAGNOSIS — I5030 Unspecified diastolic (congestive) heart failure: Secondary | ICD-10-CM | POA: Diagnosis not present

## 2017-09-16 DIAGNOSIS — F209 Schizophrenia, unspecified: Secondary | ICD-10-CM | POA: Diagnosis not present

## 2017-09-16 DIAGNOSIS — R0602 Shortness of breath: Secondary | ICD-10-CM | POA: Diagnosis not present

## 2017-09-16 DIAGNOSIS — E119 Type 2 diabetes mellitus without complications: Secondary | ICD-10-CM | POA: Diagnosis not present

## 2017-09-16 DIAGNOSIS — R110 Nausea: Secondary | ICD-10-CM | POA: Diagnosis not present

## 2017-09-16 DIAGNOSIS — I10 Essential (primary) hypertension: Secondary | ICD-10-CM

## 2017-09-16 DIAGNOSIS — R079 Chest pain, unspecified: Secondary | ICD-10-CM | POA: Diagnosis not present

## 2017-09-16 LAB — GLUCOSE, CAPILLARY: Glucose-Capillary: 125 mg/dL — ABNORMAL HIGH (ref 65–99)

## 2017-09-16 LAB — SODIUM, URINE, RANDOM: Sodium, Ur: 27 mmol/L

## 2017-09-16 LAB — CBC
HCT: 39.6 % (ref 36.0–46.0)
Hemoglobin: 13.2 g/dL (ref 12.0–15.0)
MCH: 30.5 pg (ref 26.0–34.0)
MCHC: 33.3 g/dL (ref 30.0–36.0)
MCV: 91.5 fL (ref 78.0–100.0)
PLATELETS: 206 10*3/uL (ref 150–400)
RBC: 4.33 MIL/uL (ref 3.87–5.11)
RDW: 14.1 % (ref 11.5–15.5)
WBC: 9.3 10*3/uL (ref 4.0–10.5)

## 2017-09-16 LAB — CBG MONITORING, ED
GLUCOSE-CAPILLARY: 117 mg/dL — AB (ref 65–99)
Glucose-Capillary: 143 mg/dL — ABNORMAL HIGH (ref 65–99)

## 2017-09-16 LAB — COMPREHENSIVE METABOLIC PANEL
ALBUMIN: 3.7 g/dL (ref 3.5–5.0)
ALT: 14 U/L (ref 14–54)
AST: 23 U/L (ref 15–41)
Alkaline Phosphatase: 44 U/L (ref 38–126)
Anion gap: 12 (ref 5–15)
BUN: 9 mg/dL (ref 6–20)
CHLORIDE: 93 mmol/L — AB (ref 101–111)
CO2: 27 mmol/L (ref 22–32)
Calcium: 9.1 mg/dL (ref 8.9–10.3)
Creatinine, Ser: 0.62 mg/dL (ref 0.44–1.00)
GFR calc non Af Amer: >60 mL/min (ref >60)
Glucose, Bld: 144 mg/dL — ABNORMAL HIGH (ref 65–99)
Potassium: 4.3 mmol/L (ref 3.5–5.1)
SODIUM: 132 mmol/L — AB (ref 135–145)
Total Bilirubin: 1 mg/dL (ref 0.3–1.2)
Total Protein: 7.6 g/dL (ref 6.5–8.1)

## 2017-09-16 LAB — HEPATIC FUNCTION PANEL
ALBUMIN: 3.6 g/dL (ref 3.5–5.0)
ALT: 15 U/L (ref 14–54)
AST: 22 U/L (ref 15–41)
Alkaline Phosphatase: 47 U/L (ref 38–126)
BILIRUBIN TOTAL: 0.9 mg/dL (ref 0.3–1.2)
Bilirubin, Direct: 0.2 mg/dL (ref 0.1–0.5)
Indirect Bilirubin: 0.7 mg/dL (ref 0.3–0.9)
TOTAL PROTEIN: 6.9 g/dL (ref 6.5–8.1)

## 2017-09-16 LAB — MRSA PCR SCREENING: MRSA by PCR: NEGATIVE

## 2017-09-16 LAB — URINALYSIS, ROUTINE W REFLEX MICROSCOPIC
Bilirubin Urine: NEGATIVE
Glucose, UA: NEGATIVE mg/dL
Hgb urine dipstick: NEGATIVE
Ketones, ur: NEGATIVE mg/dL
LEUKOCYTES UA: NEGATIVE
NITRITE: NEGATIVE
PROTEIN: NEGATIVE mg/dL
Specific Gravity, Urine: 1.012 (ref 1.005–1.030)
pH: 8 (ref 5.0–8.0)

## 2017-09-16 LAB — LIPASE, BLOOD: LIPASE: 21 U/L (ref 11–51)

## 2017-09-16 LAB — OSMOLALITY, URINE: OSMOLALITY UR: 236 mosm/kg — AB (ref 300–900)

## 2017-09-16 LAB — ECHOCARDIOGRAM COMPLETE

## 2017-09-16 LAB — TROPONIN I: TROPONIN I: 0.03 ng/mL — AB (ref <0.03)

## 2017-09-16 MED ORDER — IOPAMIDOL (ISOVUE-370) INJECTION 76%
100.0000 mL | Freq: Once | INTRAVENOUS | Status: AC | PRN
  Administered 2017-09-16: 100 mL via INTRAVENOUS

## 2017-09-16 MED ORDER — IOPAMIDOL (ISOVUE-370) INJECTION 76%
INTRAVENOUS | Status: AC
  Filled 2017-09-16: qty 100

## 2017-09-16 MED ORDER — AMLODIPINE BESYLATE 5 MG PO TABS
5.0000 mg | ORAL_TABLET | Freq: Every day | ORAL | Status: DC
  Administered 2017-09-17: 5 mg via ORAL
  Filled 2017-09-16: qty 1

## 2017-09-16 NOTE — Progress Notes (Signed)
Report received pt is is vas. lab. Transport to reroute patient.

## 2017-09-16 NOTE — ED Notes (Signed)
0900 dose of insulin not given as of yet - CBG done and noted per EDT; pt in process of being moved out of room for transport to [facility]

## 2017-09-16 NOTE — ED Notes (Signed)
Report given to Jenny Reichmann, RN on [facility]

## 2017-09-16 NOTE — ED Notes (Signed)
Patient transported to CT 

## 2017-09-16 NOTE — Progress Notes (Signed)
  Echocardiogram 2D Echocardiogram has been performed.  Jennette Dubin 09/16/2017, 2:42 PM

## 2017-09-16 NOTE — ED Notes (Signed)
Ordered lunch tray 

## 2017-09-16 NOTE — ED Notes (Signed)
To vasular lab

## 2017-09-16 NOTE — ED Notes (Signed)
Transported to [facility] per Darci Current, RN - unable to reach [facility] RN via telephone Darci Current to request that [facility] RN call me for report

## 2017-09-16 NOTE — ED Notes (Signed)
Assisted pt up to the bathroom, family is at bedside. Pt was steady on her feet with min assist

## 2017-09-16 NOTE — Progress Notes (Signed)
PROGRESS NOTE    Bonnie Watkins  CVE:[id number] DOB: 06-May-1935 DOA: 09/15/2017 PCP: Susy Frizzle, MD   Outpatient Specialists:     Brief Narrative:  Bonnie Watkins is a 83 y.o. female with history of diabetes mellitus type 2, hypertension, hypothyroidism, schizophrenia and depression was brought to the ER after patient started experiencing chest pain.  Patient chest pain happened after patient had lunch.  Patient initially felt mildly dizzy nauseous diaphoretic and started developing chest pain.  Chest pain was substernal nonradiating had mild shortness of breath associated with it.  Denies any vomiting or abdominal pain.  EMS was called and patient was given sublingual nitroglycerin following which chest pain resolved.     Assessment & Plan:   Principal Problem:   Chest pain Active Problems:   Diabetes mellitus without complication (HCC)   Schizophrenia (HCC)   Hypertension   Chest pain (does not report a current cardiac history-- thinks when she lived in [place], she had a cardiologist - improved with sublingual nitroglycerin.  -troponins essentially flat -CTA negative for PE -LFTs normal -U/S done: Mildly lobular hepatic contour with coarse echogenicity as can be seen with hepatocellular disease. Correlate with liver function test.  -ASA/cardiology consult -echo pending  Hypertension un-controlled  -holding hydrochlorothiazide due to mild hyponatremia -PRN meds  Mild hyponatremia -BMP show improvement this PM -urine studies pending  Diabetes mellitus type 2  -lantus -SSI  History of schizophrenia and depression on Celexa, Cogentin, perphenazine, Seroquel.   Prolonged QTC -follow metabolic panel and magnesium levels.  Hypothyroidism  -on Synthroid -TSH      DVT prophylaxis:  Lovenox   Code Status: Full Code  Family Communication: None at bedside  Disposition Plan:  Back to ALF when work up done   Consultants:    cards  Procedures:      Subjective: No further chest pain Eating well  Objective: Vitals:   09/16/17 1215 09/16/17 1230 09/16/17 1245 09/16/17 1300  BP: (!) 152/78 (!) 160/78  (!) 183/85  Pulse: 73 78 (!) 53 91  Resp: 13 16 18  (!) 22  Temp:      TempSrc:      SpO2: 94% 91% 92% 96%    Intake/Output Summary (Last 24 hours) at 09/16/2017 1443 Last data filed at 09/16/2017 0530 Gross per 24 hour  Intake -  Output 1800 ml  Net -1800 ml   There were no vitals filed for this visit.  Examination:  General exam: Appears calm and comfortable  Respiratory system: Clear to auscultation. Respiratory effort normal. Cardiovascular system: rrr Gastrointestinal system: Abdomen is nondistended, soft and nontender. No organomegaly or masses felt. Normal bowel sounds heard. Central nervous system: Alert Extremities: Symmetric 5 x 5 power. Psychiatry: cooperative    Data Reviewed: I have personally reviewed following labs and imaging studies  CBC: Recent Labs  Lab 09/15/17 1556 09/15/17 2234 09/16/17 1301  WBC 11.2* 9.7 9.3  HGB 13.7 12.7 13.2  HCT 40.6 37.2 39.6  MCV 90.8 90.5 91.5  PLT 193 210 025   Basic Metabolic Panel: Recent Labs  Lab 09/15/17 1556 09/15/17 2234 09/16/17 1301  NA 126*  --  132*  K 4.2  --  4.3  CL 88*  --  93*  CO2 26  --  27  GLUCOSE 190*  --  144*  BUN 11  --  9  CREATININE 0.65 0.62 0.62  CALCIUM 9.1  --  9.1   GFR: CrCl cannot be calculated (Unknown ideal  weight.). Liver Function Tests: Recent Labs  Lab 09/16/17 0218 09/16/17 1301  AST 22 23  ALT 15 14  ALKPHOS 47 44  BILITOT 0.9 1.0  PROT 6.9 7.6  ALBUMIN 3.6 3.7   Recent Labs  Lab 09/16/17 0218  LIPASE 21   No results for input(s): AMMONIA in the last 168 hours. Coagulation Profile: No results for input(s): INR, PROTIME in the last 168 hours. Cardiac Enzymes: Recent Labs  Lab 09/15/17 1556 09/16/17 0218 09/16/17 1301  TROPONINI <0.03 0.03* <0.03   BNP (last  3 results) No results for input(s): PROBNP in the last 8760 hours. HbA1C: No results for input(s): HGBA1C in the last 72 hours. CBG: Recent Labs  Lab 09/15/17 1823 09/15/17 2215 09/16/17 0756 09/16/17 1211  GLUCAP 162* 160* 117* 143*   Lipid Profile: No results for input(s): CHOL, HDL, LDLCALC, TRIG, CHOLHDL, LDLDIRECT in the last 72 hours. Thyroid Function Tests: No results for input(s): TSH, T4TOTAL, FREET4, T3FREE, THYROIDAB in the last 72 hours. Anemia Panel: No results for input(s): VITAMINB12, FOLATE, FERRITIN, TIBC, IRON, RETICCTPCT in the last 72 hours. Urine analysis:    Component Value Date/Time   COLORURINE STRAW (A) 09/16/2017 0530   APPEARANCEUR CLEAR 09/16/2017 0530   LABSPEC 1.012 09/16/2017 0530   PHURINE 8.0 09/16/2017 0530   GLUCOSEU NEGATIVE 09/16/2017 0530   HGBUR NEGATIVE 09/16/2017 0530   BILIRUBINUR NEGATIVE 09/16/2017 0530   KETONESUR NEGATIVE 09/16/2017 0530   PROTEINUR NEGATIVE 09/16/2017 0530   UROBILINOGEN 0.2 10/26/2013 0437   NITRITE NEGATIVE 09/16/2017 0530   LEUKOCYTESUR NEGATIVE 09/16/2017 0530     )No results found for this or any previous visit (from the past 240 hour(s)).    Anti-infectives (From admission, onward)   None       Radiology Studies: Dg Chest 2 View  Result Date: 09/15/2017 CLINICAL DATA:  Left-sided chest pressure. Diabetes. Hyperglycemic. EXAM: CHEST - 2 VIEW COMPARISON:  None. FINDINGS: Heart size is normal. No edema or effusion. No focal airspace disease present. Chronic advanced degenerative changes are present in right shoulder. IMPRESSION: No active cardiopulmonary disease. Electronically Signed   By: San Morelle M.D.   On: 09/15/2017 15:28   Ct Angio Chest Pe W Or Wo Contrast  Result Date: 09/16/2017 CLINICAL DATA:  83 year old female with chest pain. Concern for pulmonary embolism. EXAM: CT ANGIOGRAPHY CHEST WITH CONTRAST TECHNIQUE: Multidetector CT imaging of the chest was performed using the  standard protocol during bolus administration of intravenous contrast. Multiplanar CT image reconstructions and MIPs were obtained to evaluate the vascular anatomy. CONTRAST:  192mL ISOVUE-370 IOPAMIDOL (ISOVUE-370) INJECTION 76% COMPARISON:  Chest radiograph dated 09/15/2017 FINDINGS: Cardiovascular: Top-normal cardiac size. No pericardial effusion. There is mild atherosclerotic calcification of the thoracic aorta. No aneurysmal dilatation or dissection. No CT evidence of pulmonary embolism. Mediastinum/Nodes: There is no hilar or mediastinal adenopathy. There is a small hiatal hernia. The esophagus is grossly unremarkable. No mediastinal fluid collection. Lungs/Pleura: The lungs are clear. There is no pleural effusion or pneumothorax. The central airways are patent. Upper Abdomen: Slight irregularity of the liver contour suggestive of cirrhosis. Clinical correlation is recommended. Musculoskeletal: Degenerative changes of the spine. Old fracture of the right humeral head with nonunion. No acute fracture. Review of the MIP images confirms the above findings. IMPRESSION: No acute intrathoracic pathology. No CT evidence of pulmonary embolism. Electronically Signed   By: Anner Crete M.D.   On: 09/16/2017 05:49   US Abdomen Complete  Result Date: 09/16/2017 CLINICAL DATA:  Nausea EXAM:  ABDOMEN ULTRASOUND COMPLETE COMPARISON:  None. FINDINGS: Gallbladder: No gallstones or wall thickening visualized. No sonographic Murphy sign noted by sonographer. Common bile duct: Diameter: 6.2 mm Liver: No focal hepatic mass. Mildly lobular hepatic contour. Slightly coarse hepatic echotexture. Portal vein is patent on color Doppler imaging with normal direction of blood flow towards the liver. IVC: No abnormality visualized. Pancreas: Visualized portion unremarkable. Spleen: Size and appearance within normal limits. Right Kidney: Length: 11.9 cm. 1.7 x 1.6 x 1.9 cm anechoic right renal mass most consistent with a cyst.  Echogenicity within normal limits. No solid mass or hydronephrosis visualized. Left Kidney: Length: 12.4 cm. Echogenicity within normal limits. No mass or hydronephrosis visualized. Abdominal aorta: Majority of the aorta is obscured by overlying bowel gas. Visualized abdominal aorta measures 2.6 cm in diameter consistent with ectasia. Other findings: None. IMPRESSION: 1. No cholelithiasis or sonographic evidence of acute cholecystitis. 2. Mildly lobular hepatic contour with coarse echogenicity as can be seen with hepatocellular disease. Correlate with liver function test. Electronically Signed   By: Kathreen Devoid   On: 09/16/2017 08:09        Scheduled Meds: . amLODipine  5 mg Oral Daily  . aspirin  324 mg Oral Once  . aspirin EC  325 mg Oral Daily  . benztropine  1 mg Oral Daily  . citalopram  20 mg Oral Daily  . enoxaparin (LOVENOX) injection  40 mg Subcutaneous Q24H  . insulin aspart  0-9 Units Subcutaneous TID WC  . insulin glargine  5 Units Subcutaneous Daily  . iopamidol      . levothyroxine  50 mcg Oral QAC breakfast  . memantine  10 mg Oral BID  . pantoprazole  40 mg Oral Daily  . perphenazine  5 mg Oral [id number]  . QUEtiapine  100 mg Oral QHS   Continuous Infusions:   LOS: 0 days    Time spent: 35 min    Geradine Girt, DO [facility] Pager [phone]  If 7PM-7AM, please contact night-coverage [email or web address] Password [facility] 09/16/2017, 2:43 PM

## 2017-09-16 NOTE — ED Notes (Signed)
Pt back in bed and resting call light in reach

## 2017-09-16 NOTE — Consult Note (Addendum)
Cardiology Consult    Patient ID: Bonnie Watkins MRN: [id number], DOB/AGE: 1934/08/30   Admit date: 09/15/2017 Date of Consult: 09/22/2017  Primary Physician: Susy Frizzle, MD Primary Cardiologist: Dr. Marlou Porch Requesting Provider: Dr. Eliseo Squires Reason for Consultation: Chest pain, weakness  Bonnie Watkins is a 83 y.o. female who is being seen today for the evaluation of chest pain and weakness at the request of Dr. Eliseo Squires.   Patient Profile    83 yo female with PMH of DM, GERD, HTN, depression, dementia, and schizophrenia who presented with weakness and chest pain.   Past Medical History   Past Medical History:  Diagnosis Date  . ACE inhibitor-aggravated angioedema   . Dementia   . Depression   . Diabetes mellitus without complication ([place])   . Diverticulosis   . GERD (gastroesophageal reflux disease)   . Hypertension   . Osteoporosis   . Schizophrenia ([place])     History reviewed. No pertinent surgical history.   Allergies  Allergies  Allergen Reactions  . Shellfish Allergy Anaphylaxis    ALL SEAFOOD ALLERGY  . Ace Inhibitors Other (See Comments)    On MAR  . Angiotensin Receptor Blockers Other (See Comments)    ON MAR  . Lisinopril     Angioedema  . Strawberry Extract Hives  . Robaxin [Methocarbamol] Rash    History of Present Illness    Mrs. Bonnie Watkins is an 83 yo female with PMH of DM, GERD, HTN, depression, dementia, and schizophrenia. She was seen by Dr. Marlou Porch back in 2015 in regards to dyspnea, with normal EF noted at that time. Also noted to have had an echo done back in 3/18 that showed normal EF with G1DD.   She currently lives in an assisted living. Uses a rolling walker to get around, and states she is mostly independent with her ADLs. Does not normally have any anginal symptoms with activity around the facility. Yesterday afternoon she reports being outside and felt a sudden onset of fatigue and weakness. Was concerned that  her CBG may be low. Walked into the dining area and sat down. Asked for something to drink from the staff and given orange juice. RN checked her blood sugar noted at 144, but her BP was >951 systolic per report. She states she had some tightness in her chest during this time as well. EMS was called by the facility. Reports she was given 324 ASA and 1 SL nitro with improvement in chest discomfort.   In the ED her labs showed Na+ 126>>132, trop 0.03>>0.03, Hgb 13.7, Ddimer 6.52. CXR was negative. Underwent CT chest that was neg for PE. She has not had any recurrent chest pain since admission. Echo ordered by primary is pending.   Inpatient Medications      Family History    History reviewed. No pertinent family history.  Social History    Social History   Socioeconomic History  . Marital status: Divorced    Spouse name: Not on file  . Number of children: Not on file  . Years of education: Not on file  . Highest education level: Not on file  Occupational History  . Not on file  Social Needs  . Financial resource strain: Not on file  . Food insecurity:    Worry: Not on file    Inability: Not on file  . Transportation needs:    Medical: Not on file    Non-medical: Not on file  Tobacco Use  .  Smoking status: Former Research scientist (life sciences)  . Smokeless tobacco: Never Used  Substance and Sexual Activity  . Alcohol use: No  . Drug use: No  . Sexual activity: Never    Comment: lives at [facility].    Lifestyle  . Physical activity:    Days per week: Not on file    Minutes per session: Not on file  . Stress: Not on file  Relationships  . Social connections:    Talks on phone: Not on file    Gets together: Not on file    Attends religious service: Not on file    Active member of club or organization: Not on file    Attends meetings of clubs or organizations: Not on file    Relationship status: Not on file  . Intimate partner violence:    Fear of current or ex partner: Not on file     Emotionally abused: Not on file    Physically abused: Not on file    Forced sexual activity: Not on file  Other Topics Concern  . Not on file  Social History Narrative  . Not on file     Review of Systems    See HPI  All other systems reviewed and are otherwise negative except as noted above.  Physical Exam    Blood pressure (!) 158/74, pulse 71, temperature 97.6 F (36.4 C), temperature source Oral, resp. rate 16, height 5\' 7"  (1.702 m), weight 211 lb 3.2 oz (95.8 kg), SpO2 96 %.  General: Pleasant, older Hispanic female NAD Psych: Normal affect. Neuro: Alert and oriented X 3. Moves all extremities spontaneously. HEENT: Normal  Neck: Supple without bruits or JVD. Lungs:  Resp regular and unlabored, CTA. Heart: RRR no s3, s4, or murmurs. Abdomen: Soft, non-tender, non-distended, BS + x 4.  Extremities: No clubbing, cyanosis or edema. DP/PT/Radials 2+ and equal bilaterally.  Labs    Troponin (Point of Care Test) No results for input(s): TROPIPOC in the last 72 hours. No results for input(s): CKTOTAL, CKMB, TROPONINI in the last 72 hours. Lab Results  Component Value Date   WBC 7.8 09/17/2017   HGB 12.2 09/17/2017   HCT 37.2 09/17/2017   MCV 92.1 09/17/2017   PLT 191 09/17/2017    Recent Labs  Lab 09/16/17 1301 09/17/17 0638  NA 132* 132*  K 4.3 4.1  CL 93* 94*  CO2 27 31  BUN 9 12  CREATININE 0.62 0.67  CALCIUM 9.1 8.8*  PROT 7.6  --   BILITOT 1.0  --   ALKPHOS 44  --   ALT 14  --   AST 23  --   GLUCOSE 144* 124*   Lab Results  Component Value Date   CHOL 117 08/15/2016   HDL 52 08/15/2016   LDLCALC 46 08/15/2016   TRIG 94 08/15/2016   Lab Results  Component Value Date   DDIMER 6.52 (H) 09/15/2017     Radiology Studies    Dg Chest 2 View  Result Date: 09/15/2017 CLINICAL DATA:  Left-sided chest pressure. Diabetes. Hyperglycemic. EXAM: CHEST - 2 VIEW COMPARISON:  None. FINDINGS: Heart size is normal. No edema or effusion. No focal airspace  disease present. Chronic advanced degenerative changes are present in right shoulder. IMPRESSION: No active cardiopulmonary disease. Electronically Signed   By: San Morelle M.D.   On: 09/15/2017 15:28   Ct Angio Chest Pe W Or Wo Contrast  Result Date: 09/16/2017 CLINICAL DATA:  83 year old female with chest pain. Concern for  pulmonary embolism. EXAM: CT ANGIOGRAPHY CHEST WITH CONTRAST TECHNIQUE: Multidetector CT imaging of the chest was performed using the standard protocol during bolus administration of intravenous contrast. Multiplanar CT image reconstructions and MIPs were obtained to evaluate the vascular anatomy. CONTRAST:  161mL ISOVUE-370 IOPAMIDOL (ISOVUE-370) INJECTION 76% COMPARISON:  Chest radiograph dated 09/15/2017 FINDINGS: Cardiovascular: Top-normal cardiac size. No pericardial effusion. There is mild atherosclerotic calcification of the thoracic aorta. No aneurysmal dilatation or dissection. No CT evidence of pulmonary embolism. Mediastinum/Nodes: There is no hilar or mediastinal adenopathy. There is a small hiatal hernia. The esophagus is grossly unremarkable. No mediastinal fluid collection. Lungs/Pleura: The lungs are clear. There is no pleural effusion or pneumothorax. The central airways are patent. Upper Abdomen: Slight irregularity of the liver contour suggestive of cirrhosis. Clinical correlation is recommended. Musculoskeletal: Degenerative changes of the spine. Old fracture of the right humeral head with nonunion. No acute fracture. Review of the MIP images confirms the above findings. IMPRESSION: No acute intrathoracic pathology. No CT evidence of pulmonary embolism. Electronically Signed   By: Anner Crete M.D.   On: 09/16/2017 05:49   US Abdomen Complete  Result Date: 09/16/2017 CLINICAL DATA:  Nausea EXAM: ABDOMEN ULTRASOUND COMPLETE COMPARISON:  None. FINDINGS: Gallbladder: No gallstones or wall thickening visualized. No sonographic Murphy sign noted by sonographer.  Common bile duct: Diameter: 6.2 mm Liver: No focal hepatic mass. Mildly lobular hepatic contour. Slightly coarse hepatic echotexture. Portal vein is patent on color Doppler imaging with normal direction of blood flow towards the liver. IVC: No abnormality visualized. Pancreas: Visualized portion unremarkable. Spleen: Size and appearance within normal limits. Right Kidney: Length: 11.9 cm. 1.7 x 1.6 x 1.9 cm anechoic right renal mass most consistent with a cyst. Echogenicity within normal limits. No solid mass or hydronephrosis visualized. Left Kidney: Length: 12.4 cm. Echogenicity within normal limits. No mass or hydronephrosis visualized. Abdominal aorta: Majority of the aorta is obscured by overlying bowel gas. Visualized abdominal aorta measures 2.6 cm in diameter consistent with ectasia. Other findings: None. IMPRESSION: 1. No cholelithiasis or sonographic evidence of acute cholecystitis. 2. Mildly lobular hepatic contour with coarse echogenicity as can be seen with hepatocellular disease. Correlate with liver function test. Electronically Signed   By: Kathreen Devoid   On: 09/16/2017 08:09    ECG & Cardiac Imaging    EKG:  The EKG was personally reviewed and demonstrates SR  Echo: pending  Assessment & Plan    83 yo female with PMH of DM, GERD, HTN, depression, dementia, and schizophrenia who presented with weakness and chest pain.   1. Chest pain: Symptoms are somewhat atypical in nature. Suspect this could be related to her severely elevated blood pressures at that time. She was responsive to nitro, but Trop neg x3. EKG is non-acute. No further chest pain since admission. Echo pending. If normal would not suspect further cardiac work up.   2. HTN: Appears she is only on HCTZ at home. Blood pressures were high prior to admission and remain uncontrolled here as well. Given her DM considered lisinopril but developed angioedema in the past. Will add novasc 5mg  daily.   3. DM: Last Hgb A1c 7.1 back  in 4/18. Has been on lantus and metformin as outpatient.   4. Hyponatremia: 126>>132 today. HCTZ held on admission.   Barnet Pall, NP-C Pager [phone] 09/16/2017, 12:43 PM   I have examined the patient and reviewed assessment and plan and discussed with patient.  Agree with above as stated.  She feels  back to baseline.  Blood pressure elevation was likely the cause of her symptoms.  Ruling out for MI.  Symptoms resolved with nitroglycerin which may have helped due to lowering blood pressure.  Will start lisinopril 10 mg daily and uptitrate.  Shows ACE inhibitor due to her diabetes.  I personally reviewed her echo images.  Her LV function appears normal.  Await final report.  Thamas Jaegers

## 2017-09-16 NOTE — ED Notes (Signed)
care assumed at this time - currently in [place]

## 2017-09-17 DIAGNOSIS — F209 Schizophrenia, unspecified: Secondary | ICD-10-CM | POA: Diagnosis not present

## 2017-09-17 DIAGNOSIS — I10 Essential (primary) hypertension: Secondary | ICD-10-CM | POA: Diagnosis not present

## 2017-09-17 DIAGNOSIS — R079 Chest pain, unspecified: Secondary | ICD-10-CM | POA: Diagnosis not present

## 2017-09-17 DIAGNOSIS — E119 Type 2 diabetes mellitus without complications: Secondary | ICD-10-CM | POA: Diagnosis not present

## 2017-09-17 LAB — BASIC METABOLIC PANEL
ANION GAP: 7 (ref 5–15)
BUN: 12 mg/dL (ref 6–20)
CALCIUM: 8.8 mg/dL — AB (ref 8.9–10.3)
CO2: 31 mmol/L (ref 22–32)
CREATININE: 0.67 mg/dL (ref 0.44–1.00)
Chloride: 94 mmol/L — ABNORMAL LOW (ref 101–111)
GFR calc Af Amer: >60 mL/min (ref >60)
Glucose, Bld: 124 mg/dL — ABNORMAL HIGH (ref 65–99)
Potassium: 4.1 mmol/L (ref 3.5–5.1)
SODIUM: 132 mmol/L — AB (ref 135–145)

## 2017-09-17 LAB — GLUCOSE, CAPILLARY
GLUCOSE-CAPILLARY: 119 mg/dL — AB (ref 65–99)
GLUCOSE-CAPILLARY: 152 mg/dL — AB (ref 65–99)

## 2017-09-17 LAB — CBC
HEMATOCRIT: 37.2 % (ref 36.0–46.0)
Hemoglobin: 12.2 g/dL (ref 12.0–15.0)
MCH: 30.2 pg (ref 26.0–34.0)
MCHC: 32.8 g/dL (ref 30.0–36.0)
MCV: 92.1 fL (ref 78.0–100.0)
Platelets: 191 10*3/uL (ref 150–400)
RBC: 4.04 MIL/uL (ref 3.87–5.11)
RDW: 14.2 % (ref 11.5–15.5)
WBC: 7.8 10*3/uL (ref 4.0–10.5)

## 2017-09-17 LAB — TSH: TSH: 1.443 u[IU]/mL (ref 0.350–4.500)

## 2017-09-17 MED ORDER — AMLODIPINE BESYLATE 5 MG PO TABS: 5.0000 mg | ORAL_TABLET | Freq: Every day | ORAL | 0 refills | Status: DC

## 2017-09-17 NOTE — Progress Notes (Signed)
Patient transported by daughter back to [facility]

## 2017-09-17 NOTE — Progress Notes (Signed)
    Informed about angioedema in the past.  Would avoid ACE-I and uptitrate Norvasc.  Second line could be carvedilol if HR will tolerate.  Would try not to use ARB.    Jettie Booze, MD

## 2017-09-17 NOTE — Clinical Social Work Note (Signed)
CSW facilitated patient discharge including contacting patient family and facility to confirm patient discharge plans. Clinical information faxed to facility and family agreeable with plan. Patient's daughter will transport by car back to [facility]. RN to call report prior to discharge [phone]).  CSW will sign off for now as social work intervention is no longer needed. Please consult [place] again if new needs arise.  Dayton Scrape, [place]

## 2017-09-17 NOTE — Discharge Summary (Signed)
Physician Discharge Summary  Bonnie Watkins [id number] DOB: [birth year] DOA: 09/15/2017  PCP: Bonnie Frizzle, MD  Admit date: 09/15/2017 Discharge date: 09/17/2017  Admitted From: Assisted Living Disposition:  Assisted Living   Recommendations for Outpatient Follow-up:  1. Follow up with PCP in 1 week 2. Please obtain BMP in one week to recheck sodium on HCTZ 3. IF BP still elevated in 1 week, Cardiology have recommended an ARB given her diabetes.  In the setting of her ACEi induced angioedema, this is appopriate, provided she has not also had angioedema with ARBs  Home Health: None  Equipment/Devices: None  Discharge Condition: Good  CODE STATUS: FULL Diet recommendation: Cardiac, diabetic  Brief/Interim Summary: Bonnie Watkins TGGYIRSWNIO a 83 y.o.femalewithhistory of diabetes mellitus type 2, hypertension, hypothyroidism, schizophrenia and depression was brought to the ER after patient started experiencing chest pain. Patient chest pain happened after patient had lunch. Patient initially felt mildly dizzy nauseous diaphoretic and started developing chest pain. Chest pain was substernal nonradiating had mild shortness of breath associated with it. Denies any vomiting or abdominal pain. EMS was called and patient was given sublingual nitroglycerin following which chest pain resolved.       Chest pain ECG normal, serial troponins negative, echocardiogram showed no regional wall motion abnormalities.  Ultrasound showed no gallstones.  Etiology of her discomfort is not clear, possibly esophageal spasm versus gastritis.  Evaluated by Cardiology, after normal echo, no further ischemic work-up recommended. -Multiple uncontrolled risk factors, see below.   Severe range hypertension Amlodipine added.    ACE inhibitor related angioedema is not a contraindication to ARB use.  Given her diabetes, an ARB would be recommended, provided she has not had angioedema with an  ARB as well. -Needs repeat blood pressure check in 1 week, titration according to PCP.  Liver imaging Contour of the liver was "lobular" on ultrasound and CT imaging.  However her AST and ALT are normal, she has no chart history of cirrhosis, no signs of synthetic liver dysfunction, no signs of gallstones, no steatosis. -No follow up needed  Diabetes Coronary artery disease primary prevention -She was started on baby aspirin.   -If she is not already on a statin this should be initiated.   -Amlodipine was added.  Hyponatremia 126 on admission.  IMproved to 132.  Patient was asymptomatic.  She has baseline Na 130 per records here.  She has numerous reasons for low sodium, including antipsychotics, HCTZ.  HCTZ initially held, restarted on discharge Check Na in 1 week and consider discontinuing HCTZ if low   Discharge Diagnoses:  Principal Problem:   Chest pain Active Problems:   Diabetes mellitus without complication (HCC)   Schizophrenia (HCC)   Hypertension    Discharge Instructions  Discharge Instructions    Diet - low sodium heart healthy   Complete by:  As directed    Discharge instructions   Complete by:  As directed    From Dr. Loleta Books: You were admitted for chest pain and nausea.  We were initially concerned that this might be from a heart attack, but your EKG (the electrical activity in your heart) and your blood work showed no signs of heart injury, and your echocardiogram (ultrasound of the heart) was also normal.  You should add an additional blood pressure medicine, amlodipine. Take once daily in the morning. This may cause very mild swelling in the feet,but this is harmless. Follow up with your primary care doctor in 1 week for lab work  and a blood pressure check.    Continue your HCTZ for now.  Discuss with your doctor if the chest discomfort returns or you have nasuea.   Increase activity slowly   Complete by:  As directed      Allergies as of 09/17/2017       Reactions   Shellfish Allergy Anaphylaxis   ALL SEAFOOD ALLERGY   Ace Inhibitors Other (See Comments)   On MAR   Angiotensin Receptor Blockers Other (See Comments)   ON MAR   Lisinopril    Angioedema   Strawberry Extract Hives   Robaxin [methocarbamol] Rash      Medication List    TAKE these medications   alendronate 70 MG tablet Commonly known as:  FOSAMAX TAKE 1 TAB EACH WEEK 30 MIN PRIOR TO BREAKFAST WITH LARGE GLASS OF WATER. REMAIN UPRIGHT.   amLODipine 5 MG tablet Commonly known as:  NORVASC Take 1 tablet (5 mg total) by mouth daily. Start taking on:  09/18/2017   aspirin 81 MG EC tablet TAKE (1) TABLET BY MOUTH ONCE DAILY.   benztropine 1 MG tablet Commonly known as:  COGENTIN Take 1 mg by mouth daily.   Calcium Citrate-Vitamin D 200-250 MG-UNIT Tabs Commonly known as:  CITRACAL PETITES/VITAMIN D TAKE (2) TABLETS BY MOUTH TWICE DAILY.   citalopram 20 MG tablet Commonly known as:  CELEXA Take 20 mg by mouth daily.   docusate sodium 100 MG capsule Commonly known as:  COLACE TAKE (1) CAPSULE BY MOUTH ONCE DAILY.   GAVILAX powder Generic drug:  polyethylene glycol powder MIX 1 CAPFUL (17G) IN 8 OUNCES OF JUICE/WATER AND DRINK ONCE DAILY ASNEEDED FOR CONSTIPATION.   hydrochlorothiazide 25 MG tablet Commonly known as:  HYDRODIURIL TAKE ONE TABLET BY MOUTH ONCE DAILY.   LANTUS 100 UNIT/ML injection Generic drug:  insulin glargine INJECT 10 UNITS SUBCUTANEOUSLY EACH MORNING.   levothyroxine 50 MCG tablet Commonly known as:  SYNTHROID, LEVOTHROID TAKE ONE TABLET BY MOUTH ONCE DAILY.   memantine 10 MG tablet Commonly known as:  NAMENDA Take 1 tablet (10 mg total) by mouth 2 (two) times daily.   metFORMIN 1000 MG tablet Commonly known as:  GLUCOPHAGE TAKE (1) TABLET BY MOUTH TWICE DAILY.   omeprazole 40 MG capsule Commonly known as:  PRILOSEC TAKE (1) CAPSULE BY MOUTH ONCE DAILY.   perphenazine 2 MG tablet Commonly known as:  TRILAFON Take 5  mg by mouth every morning.   QUEtiapine 100 MG tablet Commonly known as:  SEROQUEL Take 1 tablet (100 mg total) by mouth at bedtime.   SAFETY-LOK INSULIN SYR 1CC/29G 29G X 1/2" 1 ML Misc Generic drug:  INSULIN SYRINGE 1CC/29G USE AS DIRECTED.   SYSTANE 0.4-0.3 % Soln Generic drug:  Polyethyl Glycol-Propyl Glycol Place 1 drop into both eyes 2 (two) times daily.   traMADol 50 MG tablet Commonly known as:  ULTRAM TAKE (1) TABLET BY MOUTH TWICE DAILY.       Allergies  Allergen Reactions  . Shellfish Allergy Anaphylaxis    ALL SEAFOOD ALLERGY  . Ace Inhibitors Other (See Comments)    On MAR  . Angiotensin Receptor Blockers Other (See Comments)    ON MAR  . Lisinopril     Angioedema  . Strawberry Extract Hives  . Robaxin [Methocarbamol] Rash    Consultations:  Cardiology   Procedures/Studies: Dg Chest 2 View  Result Date: 09/15/2017 CLINICAL DATA:  Left-sided chest pressure. Diabetes. Hyperglycemic. EXAM: CHEST - 2 VIEW COMPARISON:  None. FINDINGS: Heart size  is normal. No edema or effusion. No focal airspace disease present. Chronic advanced degenerative changes are present in right shoulder. IMPRESSION: No active cardiopulmonary disease. Electronically Signed   By: San Morelle M.D.   On: 09/15/2017 15:28   Ct Angio Chest Pe W Or Wo Contrast  Result Date: 09/16/2017 CLINICAL DATA:  83 year old female with chest pain. Concern for pulmonary embolism. EXAM: CT ANGIOGRAPHY CHEST WITH CONTRAST TECHNIQUE: Multidetector CT imaging of the chest was performed using the standard protocol during bolus administration of intravenous contrast. Multiplanar CT image reconstructions and MIPs were obtained to evaluate the vascular anatomy. CONTRAST:  148mL ISOVUE-370 IOPAMIDOL (ISOVUE-370) INJECTION 76% COMPARISON:  Chest radiograph dated 09/15/2017 FINDINGS: Cardiovascular: Top-normal cardiac size. No pericardial effusion. There is mild atherosclerotic calcification of the thoracic  aorta. No aneurysmal dilatation or dissection. No CT evidence of pulmonary embolism. Mediastinum/Nodes: There is no hilar or mediastinal adenopathy. There is a small hiatal hernia. The esophagus is grossly unremarkable. No mediastinal fluid collection. Lungs/Pleura: The lungs are clear. There is no pleural effusion or pneumothorax. The central airways are patent. Upper Abdomen: Slight irregularity of the liver contour suggestive of cirrhosis. Clinical correlation is recommended. Musculoskeletal: Degenerative changes of the spine. Old fracture of the right humeral head with nonunion. No acute fracture. Review of the MIP images confirms the above findings. IMPRESSION: No acute intrathoracic pathology. No CT evidence of pulmonary embolism. Electronically Signed   By: Anner Crete M.D.   On: 09/16/2017 05:49   US Abdomen Complete  Result Date: 09/16/2017 CLINICAL DATA:  Nausea EXAM: ABDOMEN ULTRASOUND COMPLETE COMPARISON:  None. FINDINGS: Gallbladder: No gallstones or wall thickening visualized. No sonographic Murphy sign noted by sonographer. Common bile duct: Diameter: 6.2 mm Liver: No focal hepatic mass. Mildly lobular hepatic contour. Slightly coarse hepatic echotexture. Portal vein is patent on color Doppler imaging with normal direction of blood flow towards the liver. IVC: No abnormality visualized. Pancreas: Visualized portion unremarkable. Spleen: Size and appearance within normal limits. Right Kidney: Length: 11.9 cm. 1.7 x 1.6 x 1.9 cm anechoic right renal mass most consistent with a cyst. Echogenicity within normal limits. No solid mass or hydronephrosis visualized. Left Kidney: Length: 12.4 cm. Echogenicity within normal limits. No mass or hydronephrosis visualized. Abdominal aorta: Majority of the aorta is obscured by overlying bowel gas. Visualized abdominal aorta measures 2.6 cm in diameter consistent with ectasia. Other findings: None. IMPRESSION: 1. No cholelithiasis or sonographic evidence of  acute cholecystitis. 2. Mildly lobular hepatic contour with coarse echogenicity as can be seen with hepatocellular disease. Correlate with liver function test. Electronically Signed   By: Kathreen Devoid   On: 09/16/2017 08:09   Echocardiogram 5/8 Study Conclusions  - Left ventricle: The cavity size was normal. Wall thickness was   increased in a pattern of mild LVH. Systolic function was normal.   The estimated ejection fraction was in the range of 55% to 60%.   Wall motion was normal; there were no regional wall motion   abnormalities. Doppler parameters are consistent with abnormal   left ventricular relaxation (grade 1 diastolic dysfunction). The   E/e&' ratio is between 8-15, suggesting indeterminate LV filling   pressure. - Mitral valve: Mildly thickened leaflets . There was trivial   regurgitation. - Left atrium: The atrium was mildly dilated. - Inferior vena cava: The vessel was dilated. The respirophasic   diameter changes were blunted (< 50%), consistent with elevated   central venous pressure.  Impressions:  - Compared to a prior study in  07/2016, there have been no   significant changes.      Subjective: Feels well.  Ambulating to bathroom without chest pain, chest pressure, dyspnea, shortness of breath.  No leg swelling, orthopnea.  No nausea, vomiting, epigastric pain, right upper quadrant pain, abdominal pain, diarrhea.  Discharge Exam: Vitals:   09/17/17 0338 09/17/17 0355  BP: (!) 167/78 (!) 158/74  Pulse: 71   Resp: 16   Temp: 97.6 F (36.4 C)   SpO2: 96%    Vitals:   09/16/17 1505 09/16/17 2005 09/17/17 0338 09/17/17 0355  BP: (!) 142/69 (!) 153/68 (!) 167/78 (!) 158/74  Pulse: 79 72 71   Resp: 18 (!) 24 16   Temp: 98.2 F (36.8 C) 98.6 F (37 C) 97.6 F (36.4 C)   TempSrc: Oral Oral Oral   SpO2: 96% 94% 96%   Weight: 96.7 kg (213 lb 3.2 oz)  95.8 kg (211 lb 3.2 oz)   Height: 5\' 7"  (1.702 m)       General: Pt is alert, awake, not in acute  distress, sitting in a chair, conversational Cardiovascular: RRR, S1/S2 +, no rubs, no gallops Respiratory: CTA bilaterally, no wheezing, no rhonchi Abdominal: Soft, NT, ND, bowel sounds + Extremities: no edema, no cyanosis    The results of significant diagnostics from this hospitalization (including imaging, microbiology, ancillary and laboratory) are listed below for reference.     Microbiology: Recent Results (from the past 240 hour(s))  MRSA PCR Screening     Status: None   Collection Time: 09/16/17  8:27 PM  Result Value Ref Range Status   MRSA by PCR NEGATIVE NEGATIVE Final    Comment:        The GeneXpert MRSA Assay (FDA approved for NASAL specimens only), is one component of a comprehensive MRSA colonization surveillance program. It is not intended to diagnose MRSA infection nor to guide or monitor treatment for MRSA infections. Performed at [facility], [place] [address], [place], [place] [address]      Labs: BNP (last 3 results) No results for input(s): BNP in the last 8760 hours. Basic Metabolic Panel: Recent Labs  Lab 09/15/17 1556 09/15/17 2234 09/16/17 1301 09/17/17 0638  NA 126*  --  132* 132*  K 4.2  --  4.3 4.1  CL 88*  --  93* 94*  CO2 26  --  27 31  GLUCOSE 190*  --  144* 124*  BUN 11  --  9 12  CREATININE 0.65 0.62 0.62 0.67  CALCIUM 9.1  --  9.1 8.8*   Liver Function Tests: Recent Labs  Lab 09/16/17 0218 09/16/17 1301  AST 22 23  ALT 15 14  ALKPHOS 47 44  BILITOT 0.9 1.0  PROT 6.9 7.6  ALBUMIN 3.6 3.7   Recent Labs  Lab 09/16/17 0218  LIPASE 21   No results for input(s): AMMONIA in the last 168 hours. CBC: Recent Labs  Lab 09/15/17 1556 09/15/17 2234 09/16/17 1301 09/17/17 0638  WBC 11.2* 9.7 9.3 7.8  HGB 13.7 12.7 13.2 12.2  HCT 40.6 37.2 39.6 37.2  MCV 90.8 90.5 91.5 92.1  PLT 193 210 206 191   Cardiac Enzymes: Recent Labs  Lab 09/15/17 1556 09/16/17 0218 09/16/17 1301  TROPONINI <0.03 0.03* <0.03    BNP: Invalid input(s): POCBNP CBG: Recent Labs  Lab 09/15/17 2215 09/16/17 0756 09/16/17 1211 09/16/17 2228 09/17/17 0753  GLUCAP 160* 117* 143* 125* 119*   D-Dimer Recent Labs    09/15/17  2234  DDIMER 6.52*   Hgb A1c No results for input(s): HGBA1C in the last 72 hours. Lipid Profile No results for input(s): CHOL, HDL, LDLCALC, TRIG, CHOLHDL, LDLDIRECT in the last 72 hours. Thyroid function studies Recent Labs    09/17/17 0638  TSH 1.443   Anemia work up No results for input(s): VITAMINB12, FOLATE, FERRITIN, TIBC, IRON, RETICCTPCT in the last 72 hours. Urinalysis    Component Value Date/Time   COLORURINE STRAW (A) 09/16/2017 0530   APPEARANCEUR CLEAR 09/16/2017 0530   LABSPEC 1.012 09/16/2017 0530   PHURINE 8.0 09/16/2017 0530   GLUCOSEU NEGATIVE 09/16/2017 0530   HGBUR NEGATIVE 09/16/2017 0530   BILIRUBINUR NEGATIVE 09/16/2017 0530   KETONESUR NEGATIVE 09/16/2017 0530   PROTEINUR NEGATIVE 09/16/2017 0530   UROBILINOGEN 0.2 10/26/2013 0437   NITRITE NEGATIVE 09/16/2017 0530   LEUKOCYTESUR NEGATIVE 09/16/2017 0530   Sepsis Labs Invalid input(s): PROCALCITONIN,  WBC,  LACTICIDVEN Microbiology Recent Results (from the past 240 hour(s))  MRSA PCR Screening     Status: None   Collection Time: 09/16/17  8:27 PM  Result Value Ref Range Status   MRSA by PCR NEGATIVE NEGATIVE Final    Comment:        The GeneXpert MRSA Assay (FDA approved for NASAL specimens only), is one component of a comprehensive MRSA colonization surveillance program. It is not intended to diagnose MRSA infection nor to guide or monitor treatment for MRSA infections. Performed at [facility], [place] [address] [place], [place] [address]      Time coordinating discharge: 25 minutes  SIGNED:   Edwin Dada, MD  [facility] 09/17/2017, 9:24 AM

## 2017-09-17 NOTE — NC FL2 (Signed)
[place] LEVEL OF CARE SCREENING TOOL     IDENTIFICATION  Patient Name: Bonnie Watkins Birthdate: [birth year] Sex: female Admission Date (Current Location): 09/15/2017  [facility] and [place] Number:  Herbalist and Address:  [facility], [place] [address], [place], [place] [address]      Provider Number: [id number]  Attending Physician Name and Address:  Edwin Dada, *  Relative Name and Phone Number:       Current Level of Care: Hospital Recommended Level of Care: [place] Prior Approval Number:    Date Approved/Denied:   PASRR Number:    Discharge Plan: Other (Comment)(ALF)    Current Diagnoses: Patient Active Problem List   Diagnosis Date Noted  . Chest pain 09/15/2017  . Primary localized osteoarthritis of right knee 07/21/2017  . First degree AV block 11/23/2013  . Diabetes mellitus without complication ([place])   . Schizophrenia ([place])   . Hypertension   . Dementia   . Depression   . ACE inhibitor-aggravated angioedema   . Diverticulosis     Orientation RESPIRATION BLADDER Height & Weight     Self, Time, Situation, Place  Normal Continent Weight: 211 lb 3.2 oz (95.8 kg)(scale c) Height:  5\' 7"  (170.2 cm)  BEHAVIORAL SYMPTOMS/MOOD NEUROLOGICAL BOWEL NUTRITION STATUS  (None) (Dementia) Continent Diet(Heart healthy/carb modified.)  AMBULATORY STATUS COMMUNICATION OF NEEDS Skin     Verbally Bruising                       Personal Care Assistance Level of Assistance              Functional Limitations Info  Sight, Hearing, Speech Sight Info: Adequate Hearing Info: Adequate Speech Info: Adequate    SPECIAL CARE FACTORS FREQUENCY  Blood pressure                    Contractures Contractures Info: Not present    Additional Factors Info  Code Status, Allergies, Psychotropic Code Status Info: Full Allergies Info: Shellfish Allergy, Ace Inhibitors, Angiotensin  Receptor Blockers, Lisinopril, Strawberry Extract, Robaxin (Methocarbamol). Psychotropic Info: Schizophrenia, Depression: Cogentin 1 mg PO daily, Celexa 20 mg PO daily, Seroquel 100 mg PO QHS.         Current Medications (09/17/2017):  This is the current hospital active medication list Current Facility-Administered Medications  Medication Dose Route Frequency Provider Last Rate Last Dose  . acetaminophen (TYLENOL) tablet 650 mg  650 mg Oral Q4H PRN Rise Patience, MD      . amLODipine (NORVASC) tablet 5 mg  5 mg Oral Daily Reino Bellis B, NP   5 mg at 09/17/17 0827  . aspirin chewable tablet 324 mg  324 mg Oral Once Rise Patience, MD      . aspirin EC tablet 325 mg  325 mg Oral Daily Rise Patience, MD   325 mg at 09/16/17 2227  . benztropine (COGENTIN) tablet 1 mg  1 mg Oral Daily Rise Patience, MD   1 mg at 09/16/17 1108  . citalopram (CELEXA) tablet 20 mg  20 mg Oral Daily Rise Patience, MD   20 mg at 09/17/17 2585  . enoxaparin (LOVENOX) injection 40 mg  40 mg Subcutaneous Q24H Rise Patience, MD   40 mg at 09/17/17 0827  . hydrALAZINE (APRESOLINE) injection 10 mg  10 mg Intravenous Q4H PRN Rise Patience, MD      .  insulin aspart (novoLOG) injection 0-9 Units  0-9 Units Subcutaneous TID WC Rise Patience, MD      . insulin glargine (LANTUS) injection 5 Units  5 Units Subcutaneous Daily Rise Patience, MD   5 Units at 09/16/17 1108  . levothyroxine (SYNTHROID, LEVOTHROID) tablet 50 mcg  50 mcg Oral QAC breakfast Rise Patience, MD   50 mcg at 09/17/17 0827  . memantine (NAMENDA) tablet 10 mg  10 mg Oral BID Rise Patience, MD   10 mg at 09/17/17 0827  . nitroGLYCERIN (NITROSTAT) SL tablet 0.4 mg  0.4 mg Sublingual Q5 min PRN Rise Patience, MD      . ondansetron [facility]) injection 4 mg  4 mg Intravenous Q6H PRN Rise Patience, MD      . pantoprazole (PROTONIX) EC tablet 40 mg  40 mg Oral Daily Rise Patience, MD   40 mg at 09/17/17 0827  . perphenazine (TRILAFON) tablet 5 mg  5 mg Oral Venetia Maxon, MD   5 mg at 09/16/17 4098  . QUEtiapine (SEROQUEL) tablet 100 mg  100 mg Oral QHS Rise Patience, MD   100 mg at 09/16/17 2227  . traMADol (ULTRAM) tablet 50 mg  50 mg Oral Q12H PRN Rise Patience, MD         Discharge Medications: Please see discharge summary for a list of discharge medications.  Relevant Imaging Results:  Relevant Lab Results:   Additional Information SS#: [id number]  Candie Chroman, LCSW

## 2017-09-17 NOTE — Clinical Social Work Note (Addendum)
Med tech has not reviewed documents faxed over yet but will do so now and call me back.  Dayton Scrape, [place] [phone]  11:36 am Med tech approved documentation. Faxed over [facility] signed by MD. Patient can return when ready.  Dayton Scrape, [place]

## 2017-09-17 NOTE — Clinical Social Work Note (Signed)
Clinical Social Work Assessment  Patient Details  Name: Bonnie Watkins MRN: [id number] Date of Birth: Mar 19, 1935  Date of referral:  09/17/17               Reason for consult:  Discharge Planning                Permission sought to share information with:  Facility Art therapist granted to share information::  Yes, Verbal Permission Granted  Name::        Agency::  [facility]  Relationship::     Contact Information:     Housing/Transportation Living arrangements for the past 2 months:  [place] of Information:  Patient, Medical Team, Facility Patient Interpreter Needed:  None Criminal Activity/Legal Involvement Pertinent to Current Situation/Hospitalization:  No - Comment as needed Significant Relationships:  Adult Children, Other Family Members Lives with:  Facility Resident Do you feel safe going back to the place where you live?  Yes Need for family participation in patient care:  Yes (Comment)  Care giving concerns:  Patient is a resident at [facility].   Social Worker assessment / plan:  CSW met with patient. No supports at bedside. CSW introduced role and explained that discharge planning would be discussed. Patient confirmed she is from [place] and plans to return there today. Her daughter will transport her by car. CSW faxed discharge summary and unsigned FL2 to med tech, Suanne Marker, at the ALF. Waiting for them to review and call back to confirm no changes need to be made. No further concerns. CSW encouraged patient to contact CSW as needed. CSW will continue to follow patient for support and facilitate discharge back to ALF today.  Employment status:  Retired Forensic scientist:  Medicare PT Recommendations:  Not assessed at this time [place] / Referral to community resources:  Other (Comment Required)(Plan is to return to ALF today.)  Patient/Family's Response to care:  Patient agreeable to  return to ALF. Patient's family supportive and involved in patient's care. Patient appreciated social work intervention.  Patient/Family's Understanding of and Emotional Response to Diagnosis, Current Treatment, and Prognosis:  Patient has a good understanding of the reason for admission and plan to return to ALF today. Patient appears happy with hospital care.  Emotional Assessment Appearance:  Appears stated age Attitude/Demeanor/Rapport:  Engaged, Gracious Affect (typically observed):  Accepting, Appropriate, Calm, Pleasant Orientation:  Oriented to Self, Oriented to Place, Oriented to  Time, Oriented to Situation Alcohol / Substance use:  Never Used Psych involvement (Current and /or in the community):  No (Comment)  Discharge Needs  Concerns to be addressed:  Care Coordination Readmission within the last 30 days:  No Current discharge risk:  None Barriers to Discharge:  Other(ALF has to review discharge summary and FL2.)   Candie Chroman, LCSW 09/17/2017, 10:08 AM

## 2017-09-18 ENCOUNTER — Telehealth: Payer: Self-pay | Admitting: Family Medicine

## 2017-09-18 NOTE — Consult Note (Signed)
            [facility] CM Primary Care Navigator  09/18/2017  Bonnie Watkins 1935/05/02 [id number]   Attempt toseepatient at the bedsideto identify possible discharge needs butshe was alreadydischarged back to assisted living facility [facility]) where she resides.  PerMD note, patient was admitted for chest pain, dizziness and nausea.   Primary care provider's officeis listed asprovidingtransition of care (TOC).  Patient hasdischarge instruction to follow-up withprimary care providerin 1 week (to recheck blood pressure and sodium- on HCTZ).  Primary care provider's office called Lovey Newcomer) to notify ofpatient'shealth issues needing follow-up (mainly severe range HTN and hyponatremia; DM) and made aware to refer patient to [facility] care management if deemed necessaryandappropriate for services.   For additional questions please contact:  Edwena Felty A. Aminah Zabawa, BSN, RN-BC [facility] PRIMARY CARE Navigator Cell: [phone]

## 2017-09-18 NOTE — Telephone Encounter (Signed)
Spoke to pt's daughter on 09/17/17 and pt was released from hospital and taken back to [facility] with orders. Hospital appointment made for 09/29/17 for f/u. Pt had no questions or concerns at that time.

## 2017-09-22 ENCOUNTER — Encounter (HOSPITAL_COMMUNITY): Payer: Self-pay | Admitting: Cardiology

## 2017-09-22 DIAGNOSIS — M6281 Muscle weakness (generalized): Secondary | ICD-10-CM | POA: Diagnosis not present

## 2017-09-23 DIAGNOSIS — M6281 Muscle weakness (generalized): Secondary | ICD-10-CM | POA: Diagnosis not present

## 2017-09-28 DIAGNOSIS — M6281 Muscle weakness (generalized): Secondary | ICD-10-CM | POA: Diagnosis not present

## 2017-09-29 ENCOUNTER — Encounter: Payer: Self-pay | Admitting: Family Medicine

## 2017-09-29 ENCOUNTER — Ambulatory Visit (INDEPENDENT_AMBULATORY_CARE_PROVIDER_SITE_OTHER): Payer: Medicare Other | Admitting: Family Medicine

## 2017-09-29 ENCOUNTER — Other Ambulatory Visit: Payer: Self-pay

## 2017-09-29 VITALS — BP 132/70 | HR 70 | Temp 98.2°F | Wt 215.0 lb

## 2017-09-29 DIAGNOSIS — I10 Essential (primary) hypertension: Secondary | ICD-10-CM

## 2017-09-29 DIAGNOSIS — Z09 Encounter for follow-up examination after completed treatment for conditions other than malignant neoplasm: Secondary | ICD-10-CM | POA: Diagnosis not present

## 2017-09-29 DIAGNOSIS — E1165 Type 2 diabetes mellitus with hyperglycemia: Secondary | ICD-10-CM

## 2017-09-29 DIAGNOSIS — Z794 Long term (current) use of insulin: Secondary | ICD-10-CM | POA: Diagnosis not present

## 2017-09-29 DIAGNOSIS — I209 Angina pectoris, unspecified: Secondary | ICD-10-CM

## 2017-09-29 DIAGNOSIS — IMO0001 Reserved for inherently not codable concepts without codable children: Secondary | ICD-10-CM

## 2017-09-29 NOTE — Progress Notes (Signed)
Subjective:    Patient ID: Bonnie Watkins, female    DOB: April 14, 1935, 83 y.o.   MRN: [id number]  HPI   Unfortunately, the patient was recently admitted to the hospital with atypical chest pain.  I have copied relevant portions of the discharge summary and included them below for my reference:  Admit date: 09/15/2017 Discharge date: 09/17/2017  Admitted From: Assisted Living Disposition:  Assisted Living   Recommendations for Outpatient Follow-up:  1. Follow up with PCP in 1 week 2. Please obtain BMP in one week to recheck sodium on HCTZ 3. IF BP still elevated in 1 week, Cardiology have recommended an ARB given her diabetes.  In the setting of her ACEi induced angioedema, this is appopriate, provided she has not also had angioedema with ARBs  Home Health: None  Equipment/Devices: None  Discharge Condition: Good  CODE STATUS: FULL Diet recommendation: Cardiac, diabetic  Brief/Interim Summary: Bonnie Watkins [id number] a 83 y.o.femalewithhistory of diabetes mellitus type 2, hypertension, hypothyroidism, schizophrenia and depression was brought to the ER after patient started experiencing chest pain. Patient chest pain happened after patient had lunch. Patient initially felt mildly dizzy nauseous diaphoretic and started developing chest pain. Chest pain was substernal nonradiating had mild shortness of breath associated with it. Denies any vomiting or abdominal pain. EMS was called and patient was given sublingual nitroglycerin following which chest pain resolved.       Chest pain ECG normal, serial troponins negative, echocardiogram showed no regional wall motion abnormalities.  Ultrasound showed no gallstones.  Etiology of her discomfort is not clear, possibly esophageal spasm versus gastritis.  Evaluated by Cardiology, after normal echo, no further ischemic work-up recommended. -Multiple uncontrolled risk factors, see below.   Severe range  hypertension Amlodipine added.    ACE inhibitor related angioedema is not a contraindication to ARB use.  Given her diabetes, an ARB would be recommended, provided she has not had angioedema with an ARB as well. -Needs repeat blood pressure check in 1 week, titration according to PCP.  Liver imaging Contour of the liver was "lobular" on ultrasound and CT imaging.  However her AST and ALT are normal, she has no chart history of cirrhosis, no signs of synthetic liver dysfunction, no signs of gallstones, no steatosis. -No follow up needed  Diabetes Coronary artery disease primary prevention -She was started on baby aspirin.   -If she is not already on a statin this should be initiated.   -Amlodipine was added.  Hyponatremia 126 on admission.  IMproved to 132.  Patient was asymptomatic.  She has baseline Na 130 per records here.  She has numerous reasons for low sodium, including antipsychotics, HCTZ.  HCTZ initially held, restarted on discharge Check Na in 1 week and consider discontinuing HCTZ if low   Discharge Diagnoses:  Principal Problem:   Chest pain Active Problems:   Diabetes mellitus without complication (HCC)   Schizophrenia (HCC)   Hypertension    Discharge Instructions      Discharge Instructions    Diet - low sodium heart healthy   Complete by:  As directed    Discharge instructions   Complete by:  As directed    From Dr. Loleta Books: You were admitted for chest pain and nausea.  We were initially concerned that this might be from a heart attack, but your EKG (the electrical activity in your heart) and your blood work showed no signs of heart injury, and your echocardiogram (ultrasound of the heart) was also  normal.  You should add an additional blood pressure medicine, amlodipine. Take once daily in the morning. This may cause very mild swelling in the feet,but this is harmless. Follow up with your primary care doctor in 1 week for lab work and a blood  pressure check.    Patient is here today for follow-up.  She states that she had just eaten lunch.  Approximately 30 minutes after finishing lunch, the patient went outside and developed a squeezing cramp-like pain in the center of her chest below her sternum.  There was no shortness of breath but she became diaphoretic.  She went inside and her blood pressure was elevated.  911 was called and she was taken to the hospital.  Their cardiac enzymes were negative x3.  Chest x-ray was normal.  CT scan of the chest showed no pulmonary embolism.  Echocardiogram revealed an ejection fraction of 55 to 60% with no wall motion abnormalities.  She was cleared from a cardiology standpoint and discharged home.  She is had no further episodes of chest pain.  She denies any indigestion.  She denies any melena or hematochezia.  She denies any shortness of breath with activity.  She denies any angina.  She is ambulating with a walker without difficulty.  Her blood pressure today since the addition of amlodipine 5 mg a day is excellent at 132/70.  She is tolerating the medication well without side effects.  She denies any pitting edema.  Cardiology had recommended adding an angiotensin receptor blocker if her blood pressure was elevated.  This is been avoided in the past due to her history of severe angioedema secondary to an ACE inhibitor.  Given her advanced age at 58, I do not believe that the long-term renal protective effects of an angiotensin receptor blocker outweigh the potential risk at this point.  Furthermore her blood pressure is well controlled.  Therefore at the present time, I believe it sufficient just to continue her on the amlodipine 5 mg a day.  She is due to repeat a sodium level.  Her sodium in the hospital was 132.  This could be due to a combination of her diuretic as well as her psychotropic medications.  She is due to recheck this as well Past Medical History:  Diagnosis Date  . ACE inhibitor-aggravated  angioedema   . Dementia   . Depression   . Diabetes mellitus without complication ([place])   . Diverticulosis   . GERD (gastroesophageal reflux disease)   . Hypertension   . Osteoporosis   . Schizophrenia ([place])    History reviewed. No pertinent surgical history. Current Outpatient Medications on File Prior to Visit  Medication Sig Dispense Refill  . alendronate (FOSAMAX) 70 MG tablet TAKE 1 TAB EACH WEEK 30 MIN PRIOR TO BREAKFAST WITH LARGE GLASS OF WATER. REMAIN UPRIGHT. 4 tablet 11  . amLODipine (NORVASC) 5 MG tablet Take 1 tablet (5 mg total) by mouth daily. 30 tablet 0  . aspirin 81 MG EC tablet TAKE (1) TABLET BY MOUTH ONCE DAILY. 90 tablet 3  . benztropine (COGENTIN) 1 MG tablet Take 1 mg by mouth daily.     . Calcium Citrate-Vitamin D (CITRACAL PETITES/VITAMIN D) 200-250 MG-UNIT TABS TAKE (2) TABLETS BY MOUTH TWICE DAILY. 120 each 5  . citalopram (CELEXA) 20 MG tablet Take 20 mg by mouth daily.    Marland Kitchen docusate sodium (COLACE) 100 MG capsule TAKE (1) CAPSULE BY MOUTH ONCE DAILY. 30 capsule 11  . GAVILAX powder MIX 1  CAPFUL (17G) IN 8 OUNCES OF JUICE/WATER AND DRINK ONCE DAILY ASNEEDED FOR CONSTIPATION. 510 g 3  . hydrochlorothiazide (HYDRODIURIL) 25 MG tablet TAKE ONE TABLET BY MOUTH ONCE DAILY. 30 tablet 5  . LANTUS 100 UNIT/ML injection INJECT 10 UNITS SUBCUTANEOUSLY EACH MORNING. 10 mL 3  . levothyroxine (SYNTHROID, LEVOTHROID) 50 MCG tablet TAKE ONE TABLET BY MOUTH ONCE DAILY. 30 tablet 5  . memantine (NAMENDA) 10 MG tablet Take 1 tablet (10 mg total) by mouth 2 (two) times daily. 60 tablet 2  . metFORMIN (GLUCOPHAGE) 1000 MG tablet TAKE (1) TABLET BY MOUTH TWICE DAILY. 60 tablet 3  . omeprazole (PRILOSEC) 40 MG capsule TAKE (1) CAPSULE BY MOUTH ONCE DAILY. 30 capsule 11  . perphenazine (TRILAFON) 2 MG tablet Take 5 mg by mouth every morning.     Vladimir Faster Glycol-Propyl Glycol (SYSTANE) 0.4-0.3 % SOLN Place 1 drop into both eyes 2 (two) times daily.    . QUEtiapine (SEROQUEL) 100  MG tablet Take 1 tablet (100 mg total) by mouth at bedtime. 30 tablet 5  . SAFETY-LOK INSULIN SYR 1CC/29G 29G X 1/2" 1 ML MISC USE AS DIRECTED. 100 each 5  . traMADol (ULTRAM) 50 MG tablet TAKE (1) TABLET BY MOUTH TWICE DAILY. 60 tablet 2   No current facility-administered medications on file prior to visit.    Allergies  Allergen Reactions  . Shellfish Allergy Anaphylaxis    ALL SEAFOOD ALLERGY  . Ace Inhibitors Other (See Comments)    On MAR  . Angiotensin Receptor Blockers Other (See Comments)    ON MAR  . Lisinopril     Angioedema  . Strawberry Extract Hives  . Robaxin [Methocarbamol] Rash   Social History   Socioeconomic History  . Marital status: Divorced    Spouse name: Not on file  . Number of children: Not on file  . Years of education: Not on file  . Highest education level: Not on file  Occupational History  . Not on file  Social Needs  . Financial resource strain: Not on file  . Food insecurity:    Worry: Not on file    Inability: Not on file  . Transportation needs:    Medical: Not on file    Non-medical: Not on file  Tobacco Use  . Smoking status: Former Research scientist (life sciences)  . Smokeless tobacco: Never Used  Substance and Sexual Activity  . Alcohol use: No  . Drug use: No  . Sexual activity: Never    Comment: lives at [facility].    Lifestyle  . Physical activity:    Days per week: Not on file    Minutes per session: Not on file  . Stress: Not on file  Relationships  . Social connections:    Talks on phone: Not on file    Gets together: Not on file    Attends religious service: Not on file    Active member of club or organization: Not on file    Attends meetings of clubs or organizations: Not on file    Relationship status: Not on file  . Intimate partner violence:    Fear of current or ex partner: Not on file    Emotionally abused: Not on file    Physically abused: Not on file    Forced sexual activity: Not on file  Other Topics Concern  . Not on  file  Social History Narrative  . Not on file      Review of Systems  All  other systems reviewed and are negative.      Objective:   Physical Exam  Constitutional: She appears well-developed and well-nourished.  Neck: No JVD present.  Cardiovascular: Normal rate, regular rhythm and normal heart sounds. Exam reveals no gallop and no friction rub.  No murmur heard. Pulmonary/Chest: Effort normal and breath sounds normal. No stridor. No respiratory distress. She has no wheezes. She has no rales.  Abdominal: Soft. Bowel sounds are normal. She exhibits no distension and no mass. There is no tenderness. There is no rebound and no guarding.  Vitals reviewed.         Assessment & Plan:  Essential hypertension - Plan: COMPLETE METABOLIC PANEL WITH GFR  Uncontrolled type 2 diabetes mellitus without complication, with long-term current use of insulin ([place]) - Plan: Hemoglobin A1c  Hospital discharge follow-up  Patient has remained pain-free since discharge from the hospital.  This seems to be an isolated episode of atypical chest pain.  Differential diagnosis includes GERD versus esophageal spasm versus cardiac sources of chest pain.  At the present time she is completely asymptomatic.  Therefore we will simply monitor clinically for now.  Her blood pressure is well controlled at 132/70.  I will make no additional changes but I will continue her on amlodipine 5 mg a day.  I have reviewed her hospital discharge summary.  An angiotensin receptor blocker has been recommended.  The patient has a history of anaphylaxis to shellfish.  She almost had anaphylaxis with severe angioedema secondary to an ACE inhibitor.  I have been very hesitant to try an angiotensin receptor blocker due to this.  Given her advanced age, I believe the potential risk outweigh the long-term renal protective effects for an 83 year old patient.  Therefore I will continue her only on the amlodipine for now.  I will recheck a  CMP to monitor her sodium.  She has a history of mild hyponatremia secondary to her psychotropic medications.  As long as her sodium level is 132 or higher, I am willing to accept this without changes as she has had significant problems with discontinuation of psychotropic medication in the past.  I will also check a hemoglobin A1c to monitor the management of her diabetes.

## 2017-09-30 ENCOUNTER — Other Ambulatory Visit: Payer: Self-pay | Admitting: *Deleted

## 2017-09-30 DIAGNOSIS — M6281 Muscle weakness (generalized): Secondary | ICD-10-CM | POA: Diagnosis not present

## 2017-09-30 LAB — COMPLETE METABOLIC PANEL WITH GFR
AG Ratio: 1.3 (calc) (ref 1.0–2.5)
ALBUMIN MSPROF: 4 g/dL (ref 3.6–5.1)
ALT: 13 U/L (ref 6–29)
AST: 19 U/L (ref 10–35)
Alkaline phosphatase (APISO): 53 U/L (ref 33–130)
BUN: 11 mg/dL (ref 7–25)
CALCIUM: 9.3 mg/dL (ref 8.6–10.4)
CO2: 30 mmol/L (ref 20–32)
CREATININE: 0.6 mg/dL (ref 0.60–0.88)
Chloride: 91 mmol/L — ABNORMAL LOW (ref 98–110)
GFR, EST AFRICAN AMERICAN: 99 mL/min/{1.73_m2} (ref >=60)
GFR, EST NON AFRICAN AMERICAN: 85 mL/min/{1.73_m2} (ref >=60)
Globulin: 3.2 g/dL (calc) (ref 1.9–3.7)
Glucose, Bld: 104 mg/dL — ABNORMAL HIGH (ref 65–99)
Potassium: 4.6 mmol/L (ref 3.5–5.3)
Sodium: 128 mmol/L — ABNORMAL LOW (ref 135–146)
TOTAL PROTEIN: 7.2 g/dL (ref 6.1–8.1)
Total Bilirubin: 0.6 mg/dL (ref 0.2–1.2)

## 2017-09-30 LAB — HEMOGLOBIN A1C
Hgb A1c MFr Bld: 6.4 % of total Hgb — ABNORMAL HIGH (ref <5.7)
Mean Plasma Glucose: 137 (calc)
eAG (mmol/L): 7.6 (calc)

## 2017-09-30 MED ORDER — DOCUSATE SODIUM 100 MG PO CAPS: ORAL_CAPSULE | ORAL | 11 refills | Status: DC

## 2017-09-30 MED ORDER — AMLODIPINE BESYLATE 5 MG PO TABS: 5.0000 mg | ORAL_TABLET | Freq: Every day | ORAL | 11 refills | Status: DC

## 2017-10-01 ENCOUNTER — Other Ambulatory Visit: Payer: Self-pay | Admitting: *Deleted

## 2017-10-01 MED ORDER — AMLODIPINE BESYLATE 10 MG PO TABS: 10.0000 mg | ORAL_TABLET | Freq: Every day | ORAL | 3 refills | Status: DC

## 2017-10-02 DIAGNOSIS — M6281 Muscle weakness (generalized): Secondary | ICD-10-CM | POA: Diagnosis not present

## 2017-10-05 DIAGNOSIS — M6281 Muscle weakness (generalized): Secondary | ICD-10-CM | POA: Diagnosis not present

## 2017-10-09 DIAGNOSIS — M6281 Muscle weakness (generalized): Secondary | ICD-10-CM | POA: Diagnosis not present

## 2017-10-19 ENCOUNTER — Other Ambulatory Visit: Payer: Self-pay | Admitting: Family Medicine

## 2017-11-11 ENCOUNTER — Encounter (HOSPITAL_COMMUNITY): Payer: Self-pay

## 2017-11-11 ENCOUNTER — Other Ambulatory Visit: Payer: Self-pay

## 2017-11-11 ENCOUNTER — Emergency Department (HOSPITAL_COMMUNITY)
Admission: EM | Admit: 2017-11-11 | Discharge: 2017-11-12 | Disposition: A | Discharge status: Home / Self Care | Payer: Medicare Other | Attending: Emergency Medicine | Admitting: Emergency Medicine

## 2017-11-11 DIAGNOSIS — Z7982 Long term (current) use of aspirin: Secondary | ICD-10-CM | POA: Diagnosis not present

## 2017-11-11 DIAGNOSIS — F329 Major depressive disorder, single episode, unspecified: Secondary | ICD-10-CM | POA: Diagnosis not present

## 2017-11-11 DIAGNOSIS — E119 Type 2 diabetes mellitus without complications: Secondary | ICD-10-CM | POA: Diagnosis not present

## 2017-11-11 DIAGNOSIS — F2089 Other schizophrenia: Secondary | ICD-10-CM | POA: Insufficient documentation

## 2017-11-11 DIAGNOSIS — R402111 Coma scale, eyes open, never, in the field [EMT or ambulance]: Secondary | ICD-10-CM | POA: Diagnosis not present

## 2017-11-11 DIAGNOSIS — F0390 Unspecified dementia without behavioral disturbance: Secondary | ICD-10-CM | POA: Insufficient documentation

## 2017-11-11 DIAGNOSIS — Z794 Long term (current) use of insulin: Secondary | ICD-10-CM | POA: Insufficient documentation

## 2017-11-11 DIAGNOSIS — Z79899 Other long term (current) drug therapy: Secondary | ICD-10-CM | POA: Insufficient documentation

## 2017-11-11 DIAGNOSIS — R4182 Altered mental status, unspecified: Secondary | ICD-10-CM | POA: Diagnosis not present

## 2017-11-11 DIAGNOSIS — Z87891 Personal history of nicotine dependence: Secondary | ICD-10-CM | POA: Diagnosis not present

## 2017-11-11 NOTE — ED Notes (Signed)
Bed: WA17 Expected date:  Expected time:  Means of arrival:  Comments: Ems AMS

## 2017-11-11 NOTE — ED Triage Notes (Signed)
States for one day pt making some strange statements pt has dementia pt from [facility] states finger hurts where they cut her finger nails last night.

## 2017-11-12 ENCOUNTER — Emergency Department (HOSPITAL_COMMUNITY): Payer: Medicare Other

## 2017-11-12 ENCOUNTER — Other Ambulatory Visit: Payer: Self-pay

## 2017-11-12 DIAGNOSIS — R4182 Altered mental status, unspecified: Secondary | ICD-10-CM | POA: Diagnosis not present

## 2017-11-12 DIAGNOSIS — F2089 Other schizophrenia: Secondary | ICD-10-CM | POA: Diagnosis not present

## 2017-11-12 LAB — I-STAT CHEM 8, ED
BUN: 7 mg/dL — AB (ref 8–23)
CALCIUM ION: 1.11 mmol/L — AB (ref 1.15–1.40)
CREATININE: 0.6 mg/dL (ref 0.44–1.00)
Chloride: 95 mmol/L — ABNORMAL LOW (ref 98–111)
GLUCOSE: 115 mg/dL — AB (ref 70–99)
HCT: 34 % — ABNORMAL LOW (ref 36.0–46.0)
Hemoglobin: 11.6 g/dL — ABNORMAL LOW (ref 12.0–15.0)
Potassium: 4.1 mmol/L (ref 3.5–5.1)
SODIUM: 135 mmol/L (ref 135–145)
TCO2: 25 mmol/L (ref 22–32)

## 2017-11-12 LAB — CBC WITH DIFFERENTIAL/PLATELET
Basophils Absolute: 0 10*3/uL (ref 0.0–0.1)
Basophils Relative: 0 %
EOS ABS: 0.1 10*3/uL (ref 0.0–0.7)
Eosinophils Relative: 2 %
HCT: 35.2 % — ABNORMAL LOW (ref 36.0–46.0)
HEMOGLOBIN: 11.7 g/dL — AB (ref 12.0–15.0)
Lymphocytes Relative: 29 %
Lymphs Abs: 2.2 10*3/uL (ref 0.7–4.0)
MCH: 31.3 pg (ref 26.0–34.0)
MCHC: 33.2 g/dL (ref 30.0–36.0)
MCV: 94.1 fL (ref 78.0–100.0)
Monocytes Absolute: 0.5 10*3/uL (ref 0.1–1.0)
Monocytes Relative: 7 %
NEUTROS PCT: 62 %
Neutro Abs: 4.7 10*3/uL (ref 1.7–7.7)
Platelets: 213 10*3/uL (ref 150–400)
RBC: 3.74 MIL/uL — ABNORMAL LOW (ref 3.87–5.11)
RDW: 13.6 % (ref 11.5–15.5)
WBC: 7.5 10*3/uL (ref 4.0–10.5)

## 2017-11-12 LAB — URINALYSIS, ROUTINE W REFLEX MICROSCOPIC
BILIRUBIN URINE: NEGATIVE
GLUCOSE, UA: NEGATIVE mg/dL
HGB URINE DIPSTICK: NEGATIVE
Ketones, ur: NEGATIVE mg/dL
Leukocytes, UA: NEGATIVE
Nitrite: NEGATIVE
PROTEIN: NEGATIVE mg/dL
SPECIFIC GRAVITY, URINE: 1.005 (ref 1.005–1.030)
pH: 7 (ref 5.0–8.0)

## 2017-11-12 NOTE — ED Provider Notes (Signed)
[facility] DEPT Provider Note   CSN: [id number] Arrival date & time: 11/11/17  2341     History   Chief Complaint Chief Complaint  Patient presents with  . Altered Mental Status    HPI Bonnie Watkins is a 83 y.o. female.  The history is provided by the EMS personnel, medical records and the patient.  Altered Mental Status   This is a recurrent problem. The current episode started yesterday. The problem has been resolved. Pertinent negatives include no confusion, no somnolence, no seizures, no unresponsiveness, no weakness, no agitation, no delusions, no hallucinations, no self-injury and no violence. Associated symptoms comments: Made statements about having her fingernails clipped in the middle of the night and staff at facility was concerned and sent her in.  Patient states "I say these things because I have schizophrenia.". Risk factors: dementia and schizophrenia. Her past medical history is significant for dementia.    Past Medical History:  Diagnosis Date  . ACE inhibitor-aggravated angioedema   . Dementia   . Depression   . Diabetes mellitus without complication ([place])   . Diverticulosis   . GERD (gastroesophageal reflux disease)   . Hypertension   . Osteoporosis   . Schizophrenia [facility])     Patient Active Problem List   Diagnosis Date Noted  . Chest pain 09/15/2017  . Primary localized osteoarthritis of right knee 07/21/2017  . First degree AV block 11/23/2013  . Diabetes mellitus without complication ([place])   . Schizophrenia ([place])   . Hypertension   . Dementia   . Depression   . ACE inhibitor-aggravated angioedema   . Diverticulosis     History reviewed. No pertinent surgical history.   OB History   None      Home Medications    Prior to Admission medications   Medication Sig Start Date End Date Taking? Authorizing Provider  alendronate (FOSAMAX) 70 MG tablet TAKE 1 TAB EACH WEEK 30 MIN PRIOR TO BREAKFAST  WITH LARGE GLASS OF WATER. REMAIN UPRIGHT. 10/19/17   Susy Frizzle, MD  amLODipine (NORVASC) 10 MG tablet Take 1 tablet (10 mg total) by mouth daily. 10/01/17   Susy Frizzle, MD  aspirin 81 MG EC tablet TAKE (1) TABLET BY MOUTH ONCE DAILY. 05/19/17   Susy Frizzle, MD  benztropine (COGENTIN) 1 MG tablet Take 1 mg by mouth daily.     [provider]  Calcium Citrate-Vitamin D (CITRACAL PETITES/VITAMIN D) 200-250 MG-UNIT TABS TAKE (2) TABLETS BY MOUTH TWICE DAILY. 12/31/16   Susy Frizzle, MD  citalopram (CELEXA) 20 MG tablet Take 20 mg by mouth daily.    [provider]  docusate sodium (COLACE) 100 MG capsule TAKE (1) CAPSULE BY MOUTH ONCE DAILY. 09/30/17   Susy Frizzle, MD  GAVILAX powder MIX 1 CAPFUL (17G) IN 8 OUNCES OF JUICE/WATER AND DRINK ONCE DAILY ASNEEDED FOR CONSTIPATION. 06/03/17   Susy Frizzle, MD  LANTUS 100 UNIT/ML injection INJECT 10 UNITS SUBCUTANEOUSLY EACH MORNING. 08/24/17   Susy Frizzle, MD  levothyroxine (SYNTHROID, LEVOTHROID) 50 MCG tablet TAKE ONE TABLET BY MOUTH ONCE DAILY. 11/19/16   Susy Frizzle, MD  memantine (NAMENDA) 10 MG tablet Take 1 tablet (10 mg total) by mouth 2 (two) times daily. 09/21/12   Susy Frizzle, MD  metFORMIN (GLUCOPHAGE) 1000 MG tablet TAKE (1) TABLET BY MOUTH TWICE DAILY. 11/18/16   Susy Frizzle, MD  omeprazole (PRILOSEC) 40 MG capsule TAKE (1) CAPSULE BY MOUTH  ONCE DAILY. 12/31/16   Susy Frizzle, MD  perphenazine (TRILAFON) 2 MG tablet Take 5 mg by mouth every morning.     [provider]  Polyethyl Glycol-Propyl Glycol (SYSTANE) 0.4-0.3 % SOLN Place 1 drop into both eyes 2 (two) times daily.    [provider]  QUEtiapine (SEROQUEL) 100 MG tablet Take 1 tablet (100 mg total) by mouth at bedtime. 12/07/15   Susy Frizzle, MD  SAFETY-LOK INSULIN SYR 1CC/29G 29G X 1/2" 1 ML MISC USE AS DIRECTED. 12/31/16   Susy Frizzle, MD  traMADol (ULTRAM) 50 MG tablet TAKE (1) TABLET  BY MOUTH TWICE DAILY. 01/16/17   Susy Frizzle, MD    Family History Family History  Problem Relation Age of Onset  . Diabetes Mother   . Hypertension Father     Social History Social History   Tobacco Use  . Smoking status: Former Research scientist (life sciences)  . Smokeless tobacco: Never Used  Substance Use Topics  . Alcohol use: No  . Drug use: No     Allergies   Shellfish allergy; Ace inhibitors; Angiotensin receptor blockers; Lisinopril; Strawberry extract; and Robaxin [methocarbamol]   Review of Systems Review of Systems  Constitutional: Negative for fever.  HENT: Negative for sore throat.   Eyes: Negative for visual disturbance.  Respiratory: Negative for cough, chest tightness and shortness of breath.   Cardiovascular: Negative for chest pain, palpitations and leg swelling.  Gastrointestinal: Negative for abdominal pain, constipation and vomiting.  Genitourinary: Negative for dysuria.  Musculoskeletal: Negative for neck pain and neck stiffness.  Skin: Negative for rash.  Neurological: Negative for dizziness, seizures, facial asymmetry, weakness and light-headedness.  Psychiatric/Behavioral: Negative for agitation, behavioral problems, confusion, dysphoric mood, hallucinations, self-injury and sleep disturbance. The patient is not nervous/anxious.   All other systems reviewed and are negative.    Physical Exam Updated Vital Signs BP (!) 161/69 (BP Location: Right Arm)   Pulse 77   Temp 98.4 F (36.9 C) (Oral)   Resp 17   Ht 5\' 6"  (1.676 m)   Wt 97.5 kg (215 lb)   SpO2 98%   BMI 34.70 kg/m   Physical Exam  Constitutional: She appears well-developed and well-nourished. No distress.  HENT:  Head: Normocephalic and atraumatic.  Mouth/Throat: Oropharynx is clear and moist. No oropharyngeal exudate.  Eyes: Pupils are equal, round, and reactive to light. Conjunctivae and EOM are normal.  Neck: Normal range of motion. Neck supple.  Cardiovascular: Normal rate, regular rhythm,  normal heart sounds and intact distal pulses.  Pulmonary/Chest: Effort normal and breath sounds normal. No respiratory distress.  Abdominal: Soft. Bowel sounds are normal. She exhibits no mass. There is no tenderness. There is no rebound and no guarding.  Musculoskeletal: Normal range of motion. She exhibits no edema or tenderness.  Neurological: She is alert. She displays normal reflexes. No cranial nerve deficit.  Skin: Skin is warm and dry. Capillary refill takes less than 2 seconds.  Psychiatric: She has a normal mood and affect.  Nursing note and vitals reviewed.    ED Treatments / Results  Labs (all labs ordered are listed, but only abnormal results are displayed) Results for orders placed or performed during the hospital encounter of 11/11/17  CBC with Differential/Platelet  Result Value Ref Range   WBC 7.5 4.0 - 10.5 K/uL   RBC 3.74 (L) 3.87 - 5.11 MIL/uL   Hemoglobin 11.7 (L) 12.0 - 15.0 g/dL   HCT 35.2 (L) 36.0 - 46.0 %  MCV 94.1 78.0 - 100.0 fL   MCH 31.3 26.0 - 34.0 pg   MCHC 33.2 30.0 - 36.0 g/dL   RDW 13.6 11.5 - 15.5 %   Platelets 213 150 - 400 K/uL   Neutrophils Relative % 62 %   Neutro Abs 4.7 1.7 - 7.7 K/uL   Lymphocytes Relative 29 %   Lymphs Abs 2.2 0.7 - 4.0 K/uL   Monocytes Relative 7 %   Monocytes Absolute 0.5 0.1 - 1.0 K/uL   Eosinophils Relative 2 %   Eosinophils Absolute 0.1 0.0 - 0.7 K/uL   Basophils Relative 0 %   Basophils Absolute 0.0 0.0 - 0.1 K/uL  Urinalysis, Routine w reflex microscopic  Result Value Ref Range   Color, Urine STRAW (A) YELLOW   APPearance CLEAR CLEAR   Specific Gravity, Urine 1.005 1.005 - 1.030   pH 7.0 5.0 - 8.0   Glucose, UA NEGATIVE NEGATIVE mg/dL   Hgb urine dipstick NEGATIVE NEGATIVE   Bilirubin Urine NEGATIVE NEGATIVE   Ketones, ur NEGATIVE NEGATIVE mg/dL   Protein, ur NEGATIVE NEGATIVE mg/dL   Nitrite NEGATIVE NEGATIVE   Leukocytes, UA NEGATIVE NEGATIVE  I-stat chem 8, ed  Result Value Ref Range   Sodium 135  135 - 145 mmol/L   Potassium 4.1 3.5 - 5.1 mmol/L   Chloride 95 (L) 98 - 111 mmol/L   BUN 7 (L) 8 - 23 mg/dL   Creatinine, Ser 0.60 0.44 - 1.00 mg/dL   Glucose, Bld 115 (H) 70 - 99 mg/dL   Calcium, Ion 1.11 (L) 1.15 - 1.40 mmol/L   TCO2 25 22 - 32 mmol/L   Hemoglobin 11.6 (L) 12.0 - 15.0 g/dL   HCT 34.0 (L) 36.0 - 46.0 %   Dg Chest 2 View  Result Date: 11/12/2017 CLINICAL DATA:  Altered mental status. EXAM: CHEST - 2 VIEW COMPARISON:  09/15/2017 FINDINGS: Normal heart size and pulmonary vascularity. No focal airspace disease or consolidation in the lungs. No blunting of costophrenic angles. No pneumothorax. Mediastinal contours appear intact. Sclerosis and degenerative changes involving the right humeral head corresponding to old ununited fracture seen better on previous studies. Degenerative changes in the spine. IMPRESSION: No active cardiopulmonary disease. Electronically Signed   By: Lucienne Capers M.D.   On: 11/12/2017 00:23   Ct Head Wo Contrast  Result Date: 11/12/2017 CLINICAL DATA:  Altered mental status. Patient has been making strain statements for 1 day. History of dementia, schizophrenia, hypertension. EXAM: CT HEAD WITHOUT CONTRAST TECHNIQUE: Contiguous axial images were obtained from the base of the skull through the vertex without intravenous contrast. COMPARISON:  MRI brain 01/19/2015.  CT head 12/14/2014 FINDINGS: Brain: Mild cerebral atrophy. Patchy low-attenuation changes in the deep white matter consistent with small vessel ischemia. No evidence of acute infarction, hemorrhage, hydrocephalus, extra-axial collection or mass lesion/mass effect. Vascular: Intracranial arterial vascular calcifications are present. Skull: Normal. Negative for fracture or focal lesion. Sinuses/Orbits: No acute finding. Other: None. IMPRESSION: No acute intracranial abnormalities. Chronic atrophy and small vessel ischemic changes. Electronically Signed   By: Lucienne Capers M.D.   On: 11/12/2017 00:31      Radiology Dg Chest 2 View  Result Date: 11/12/2017 CLINICAL DATA:  Altered mental status. EXAM: CHEST - 2 VIEW COMPARISON:  09/15/2017 FINDINGS: Normal heart size and pulmonary vascularity. No focal airspace disease or consolidation in the lungs. No blunting of costophrenic angles. No pneumothorax. Mediastinal contours appear intact. Sclerosis and degenerative changes involving the right humeral head corresponding to old ununited  fracture seen better on previous studies. Degenerative changes in the spine. IMPRESSION: No active cardiopulmonary disease. Electronically Signed   By: Lucienne Capers M.D.   On: 11/12/2017 00:23   Ct Head Wo Contrast  Result Date: 11/12/2017 CLINICAL DATA:  Altered mental status. Patient has been making strain statements for 1 day. History of dementia, schizophrenia, hypertension. EXAM: CT HEAD WITHOUT CONTRAST TECHNIQUE: Contiguous axial images were obtained from the base of the skull through the vertex without intravenous contrast. COMPARISON:  MRI brain 01/19/2015.  CT head 12/14/2014 FINDINGS: Brain: Mild cerebral atrophy. Patchy low-attenuation changes in the deep white matter consistent with small vessel ischemia. No evidence of acute infarction, hemorrhage, hydrocephalus, extra-axial collection or mass lesion/mass effect. Vascular: Intracranial arterial vascular calcifications are present. Skull: Normal. Negative for fracture or focal lesion. Sinuses/Orbits: No acute finding. Other: None. IMPRESSION: No acute intracranial abnormalities. Chronic atrophy and small vessel ischemic changes. Electronically Signed   By: Lucienne Capers M.D.   On: 11/12/2017 00:31    Procedures Procedures (including critical care time)  Medications Ordered in ED Medications - No data to display   Final Clinical Impressions(s) / ED Diagnoses   Pleasant and conversant and at her mental baseline.  No signs of infection or acute illness.  Stable for discharge.     Return for  pain, numbness, changes in vision or speech, fevers >100.4 unrelieved by medication, shortness of breath, intractable vomiting, or diarrhea, abdominal pain, Inability to tolerate liquids or food, cough, altered mental status or any concerns. No signs of systemic illness or infection. The patient is nontoxic-appearing on exam and vital signs are within normal limits. Will refer to urology for microscopy hematuria as patient is asymptomatic.  I have reviewed the triage vital signs and the nursing notes. Pertinent labs &imaging results that were available during my care of the patient were reviewed by me and considered in my medical decision making (see chart for details).  After history, exam, and medical workup I feel the patient has been appropriately medically screened and is safe for discharge home. Pertinent diagnoses were discussed with the patient. Patient was given return precautions.   Emmauel Hallums, MD 11/12/17 0100

## 2017-11-13 ENCOUNTER — Telehealth: Payer: Self-pay | Admitting: Family Medicine

## 2017-11-13 NOTE — Telephone Encounter (Signed)
FYI:   Vicente Males daughter of patient states she was seen at ER on the 4th of July. She needs a follow up appointment. I have left msg asking her to call and we will schedule the appt.  CB# [phone]

## 2017-11-18 NOTE — Telephone Encounter (Signed)
Tried to call x 2 and number can not be competed as dialed(?)

## 2017-11-26 DIAGNOSIS — F251 Schizoaffective disorder, depressive type: Secondary | ICD-10-CM | POA: Diagnosis not present

## 2017-12-02 ENCOUNTER — Encounter: Payer: Self-pay | Admitting: Family Medicine

## 2017-12-02 NOTE — Telephone Encounter (Signed)
Letter mailed to pt to schedule an apt.

## 2018-01-19 ENCOUNTER — Other Ambulatory Visit: Payer: Self-pay | Admitting: Family Medicine

## 2018-01-19 DIAGNOSIS — Z1231 Encounter for screening mammogram for malignant neoplasm of breast: Secondary | ICD-10-CM

## 2018-02-15 ENCOUNTER — Ambulatory Visit
Admission: RE | Admit: 2018-02-15 | Discharge: 2018-02-15 | Disposition: A | Discharge status: Home / Self Care | Payer: Medicare Other | Source: Ambulatory Visit | Attending: Family Medicine | Admitting: Family Medicine

## 2018-02-15 DIAGNOSIS — Z1231 Encounter for screening mammogram for malignant neoplasm of breast: Secondary | ICD-10-CM

## 2018-02-22 ENCOUNTER — Other Ambulatory Visit: Payer: Self-pay | Admitting: Family Medicine

## 2018-03-08 ENCOUNTER — Ambulatory Visit
Admission: RE | Admit: 2018-03-08 | Discharge: 2018-03-08 | Disposition: A | Discharge status: Home / Self Care | Payer: Medicare Other | Source: Ambulatory Visit | Attending: Family Medicine | Admitting: Family Medicine

## 2018-03-08 ENCOUNTER — Ambulatory Visit (INDEPENDENT_AMBULATORY_CARE_PROVIDER_SITE_OTHER): Payer: Medicare Other | Admitting: Family Medicine

## 2018-03-08 ENCOUNTER — Other Ambulatory Visit: Payer: Self-pay

## 2018-03-08 ENCOUNTER — Encounter: Payer: Self-pay | Admitting: Family Medicine

## 2018-03-08 VITALS — BP 138/78 | HR 68 | Temp 98.6°F | Resp 16 | Ht 67.0 in | Wt 236.0 lb

## 2018-03-08 DIAGNOSIS — R159 Full incontinence of feces: Principal | ICD-10-CM

## 2018-03-08 DIAGNOSIS — K5900 Constipation, unspecified: Secondary | ICD-10-CM

## 2018-03-08 DIAGNOSIS — I209 Angina pectoris, unspecified: Secondary | ICD-10-CM | POA: Diagnosis not present

## 2018-03-08 DIAGNOSIS — M25511 Pain in right shoulder: Secondary | ICD-10-CM | POA: Diagnosis not present

## 2018-03-08 NOTE — Progress Notes (Signed)
Subjective:    Patient ID: Bonnie Watkins, female    DOB: [birth year], 83 y.o.   MRN: [id number]  HPI  Patient presents today with several concerns.  First over the last month, she has had episodes of fecal incontinence.  Occasionally when she stands up, stool simply come out of her rectum.  At times will be watery.  At other times will be hard stool pellets.  She takes MiraLAX intermittently due to constipation.  Some days she will go 3 days without a bowel movement and then she will take MiraLAX.  However whenever she takes MiraLAX she will have diarrhea.  This prevents her from taking it every day.  Rectal exam is performed today.  She has weak sphincter tone.  When I asked her to clamp down on my finger, I can barely feel her do anything.  Therefore there may be decreased rectal tone accounting for some of her fecal incontinence.  However there is no stool in the rectal vault.  I can appreciate no impaction or mass or blockage.  She denies any melena or hematochezia.  She also complains of pain in her right shoulder with abduction greater than 90 degrees.  She is have pain with internal and external rotation.  Pain is been present now for more than a month. Past Medical History:  Diagnosis Date  . ACE inhibitor-aggravated angioedema   . Dementia ([place])   . Depression   . Diabetes mellitus without complication ([place])   . Diverticulosis   . GERD (gastroesophageal reflux disease)   . Hypertension   . Osteoporosis   . Schizophrenia ([place])    History reviewed. No pertinent surgical history. Current Outpatient Medications on File Prior to Visit  Medication Sig Dispense Refill  . alendronate (FOSAMAX) 70 MG tablet TAKE 1 TAB EACH WEEK 30 MIN PRIOR TO BREAKFAST WITH LARGE GLASS OF WATER. REMAIN UPRIGHT. 12 tablet 3  . amLODipine (NORVASC) 10 MG tablet Take 1 tablet (10 mg total) by mouth daily. 90 tablet 3  . aspirin 81 MG EC tablet TAKE (1) TABLET BY MOUTH ONCE DAILY. 90 tablet 3  .  B-D INSULIN SYRINGE 29G X 1/2" 1 ML MISC USE AS DIRECTED. 100 each 0  . benztropine (COGENTIN) 1 MG tablet Take 1 mg by mouth daily.     . Calcium Citrate-Vitamin D (CITRACAL PETITES/VITAMIN D) 200-250 MG-UNIT TABS TAKE (2) TABLETS BY MOUTH TWICE DAILY. 120 each 5  . citalopram (CELEXA) 20 MG tablet Take 20 mg by mouth daily.    Marland Kitchen docusate sodium (COLACE) 100 MG capsule TAKE (1) CAPSULE BY MOUTH ONCE DAILY. 30 capsule 11  . GAVILAX powder MIX 1 CAPFUL (17G) IN 8 OUNCES OF JUICE/WATER AND DRINK ONCE DAILY ASNEEDED FOR CONSTIPATION. 510 g 3  . LANTUS 100 UNIT/ML injection INJECT 10 UNITS SUBCUTANEOUSLY EACH MORNING. 10 mL 3  . levothyroxine (SYNTHROID, LEVOTHROID) 50 MCG tablet TAKE ONE TABLET BY MOUTH ONCE DAILY. 30 tablet 5  . memantine (NAMENDA) 10 MG tablet Take 1 tablet (10 mg total) by mouth 2 (two) times daily. 60 tablet 2  . metFORMIN (GLUCOPHAGE) 1000 MG tablet TAKE (1) TABLET BY MOUTH TWICE DAILY. 60 tablet 3  . omeprazole (PRILOSEC) 40 MG capsule TAKE (1) CAPSULE BY MOUTH ONCE DAILY. 30 capsule 11  . perphenazine (TRILAFON) 2 MG tablet Take 5 mg by mouth every morning.     Vladimir Faster Glycol-Propyl Glycol (SYSTANE) 0.4-0.3 % SOLN Place 1 drop into both eyes 2 (two) times daily.    Marland Kitchen  QUEtiapine (SEROQUEL) 100 MG tablet Take 1 tablet (100 mg total) by mouth at bedtime. 30 tablet 5  . traMADol (ULTRAM) 50 MG tablet TAKE (1) TABLET BY MOUTH TWICE DAILY. 60 tablet 2   No current facility-administered medications on file prior to visit.    Allergies  Allergen Reactions  . Shellfish Allergy Anaphylaxis    ALL SEAFOOD ALLERGY  . Ace Inhibitors Other (See Comments)    On MAR  . Angiotensin Receptor Blockers Other (See Comments)    ON MAR  . Lisinopril     Angioedema  . Strawberry Extract Hives  . Robaxin [Methocarbamol] Rash   Social History   Socioeconomic History  . Marital status: Divorced    Spouse name: Not on file  . Number of children: Not on file  . Years of education:  Not on file  . Highest education level: Not on file  Occupational History  . Not on file  Social Needs  . Financial resource strain: Not on file  . Food insecurity:    Worry: Not on file    Inability: Not on file  . Transportation needs:    Medical: Not on file    Non-medical: Not on file  Tobacco Use  . Smoking status: Former Research scientist (life sciences)  . Smokeless tobacco: Never Used  Substance and Sexual Activity  . Alcohol use: No  . Drug use: No  . Sexual activity: Never    Comment: lives at [facility].    Lifestyle  . Physical activity:    Days per week: Not on file    Minutes per session: Not on file  . Stress: Not on file  Relationships  . Social connections:    Talks on phone: Not on file    Gets together: Not on file    Attends religious service: Not on file    Active member of club or organization: Not on file    Attends meetings of clubs or organizations: Not on file    Relationship status: Not on file  . Intimate partner violence:    Fear of current or ex partner: Not on file    Emotionally abused: Not on file    Physically abused: Not on file    Forced sexual activity: Not on file  Other Topics Concern  . Not on file  Social History Narrative  . Not on file      Review of Systems  All other systems reviewed and are negative.      Objective:   Physical Exam  Constitutional: She appears well-developed and well-nourished.  Neck: No JVD present.  Cardiovascular: Normal rate, regular rhythm and normal heart sounds. Exam reveals no gallop and no friction rub.  No murmur heard. Pulmonary/Chest: Effort normal and breath sounds normal. No stridor. No respiratory distress. She has no wheezes. She has no rales.  Abdominal: Soft. Bowel sounds are normal. She exhibits no distension and no mass. There is no tenderness. There is no rebound and no guarding.  Genitourinary: Rectal exam shows anal tone abnormal. Rectal exam shows no fissure, no mass and no tenderness.    Musculoskeletal:       Right shoulder: She exhibits decreased range of motion, tenderness, pain and decreased strength. She exhibits no bony tenderness.  Vitals reviewed.         Assessment & Plan:  Fecal incontinence alternating with constipation - Plan: DG Abd 2 Views  Acute pain of right shoulder  Obtain an x-ray of her abdomen to evaluate  for severe constipation.  If the patient has severe constipation this could possibly be causing some of her fecal incontinence and I would try to regulate her bowel movements more consistently with a combination of fiber and switching MiraLAX to low-dose Linzess 74 mcg daily.  If the x-ray shows no significant stool burden or impaction, I would focus our intention on trying to improve her rectal tone by having the patient perform Keagle exercises to strengthen the rectal tone.  Regarding the pain in her right shoulder, I suspect impingement syndrome.  I recommended a cortisone injection of the right shoulder.  She agrees.  Using sterile technique, I injected the subacromial space with a mixture of 2 cc of lidocaine, 2 cc of Marcaine, and 2 cc of 40 mg/mL Kenalog.  Patient tolerated the procedure well without complication

## 2018-03-19 ENCOUNTER — Other Ambulatory Visit: Payer: Self-pay

## 2018-03-19 MED ORDER — LINACLOTIDE 72 MCG PO CAPS: 72.0000 ug | ORAL_CAPSULE | Freq: Every day | ORAL | 2 refills | Status: DC

## 2018-03-25 ENCOUNTER — Telehealth: Payer: Self-pay | Admitting: Family Medicine

## 2018-03-25 NOTE — Telephone Encounter (Signed)
Received a phone call from [place] with [facility] requesting orders for a u/a and urine culture to rule out possible UTI. States patient is having some abnormal behaviors. Please advise?  Helene Kelp can be reached at [phone]

## 2018-03-29 ENCOUNTER — Ambulatory Visit (INDEPENDENT_AMBULATORY_CARE_PROVIDER_SITE_OTHER): Payer: Medicare Other | Admitting: Family Medicine

## 2018-03-29 ENCOUNTER — Encounter: Payer: Self-pay | Admitting: Family Medicine

## 2018-03-29 VITALS — BP 154/68 | HR 68 | Temp 98.0°F | Resp 16 | Ht 67.0 in | Wt 234.0 lb

## 2018-03-29 DIAGNOSIS — R350 Frequency of micturition: Secondary | ICD-10-CM

## 2018-03-29 DIAGNOSIS — F0280 Dementia in other diseases classified elsewhere without behavioral disturbance: Secondary | ICD-10-CM | POA: Diagnosis not present

## 2018-03-29 DIAGNOSIS — I209 Angina pectoris, unspecified: Secondary | ICD-10-CM

## 2018-03-29 DIAGNOSIS — G309 Alzheimer's disease, unspecified: Secondary | ICD-10-CM

## 2018-03-29 DIAGNOSIS — R410 Disorientation, unspecified: Secondary | ICD-10-CM

## 2018-03-29 LAB — URINALYSIS, ROUTINE W REFLEX MICROSCOPIC
BACTERIA UA: NONE SEEN /HPF
Bilirubin Urine: NEGATIVE
Glucose, UA: NEGATIVE
HGB URINE DIPSTICK: NEGATIVE
Ketones, ur: NEGATIVE
Nitrite: NEGATIVE
PROTEIN: NEGATIVE
RBC / HPF: NONE SEEN /HPF (ref 0–2)
Specific Gravity, Urine: 1.015 (ref 1.001–1.03)
pH: 8.5 — ABNORMAL HIGH (ref 5.0–8.0)

## 2018-03-29 LAB — COMPLETE METABOLIC PANEL WITH GFR
AG RATIO: 1.3 (calc) (ref 1.0–2.5)
ALT: 14 U/L (ref 6–29)
AST: 16 U/L (ref 10–35)
Albumin: 3.9 g/dL (ref 3.6–5.1)
Alkaline phosphatase (APISO): 55 U/L (ref 33–130)
BILIRUBIN TOTAL: 0.5 mg/dL (ref 0.2–1.2)
BUN: 15 mg/dL (ref 7–25)
CHLORIDE: 95 mmol/L — AB (ref 98–110)
CO2: 27 mmol/L (ref 20–32)
Calcium: 9.2 mg/dL (ref 8.6–10.4)
Creat: 0.78 mg/dL (ref 0.60–0.88)
GFR, Est African American: 83 mL/min/{1.73_m2} (ref >=60)
GFR, Est Non African American: 71 mL/min/{1.73_m2} (ref >=60)
GLUCOSE: 100 mg/dL — AB (ref 65–99)
Globulin: 3.1 g/dL (calc) (ref 1.9–3.7)
POTASSIUM: 4.6 mmol/L (ref 3.5–5.3)
Sodium: 131 mmol/L — ABNORMAL LOW (ref 135–146)
TOTAL PROTEIN: 7 g/dL (ref 6.1–8.1)

## 2018-03-29 LAB — CBC WITH DIFFERENTIAL/PLATELET
BASOS PCT: 0.6 %
Basophils Absolute: 43 cells/uL (ref 0–200)
Eosinophils Absolute: 50 cells/uL (ref 15–500)
Eosinophils Relative: 0.7 %
HCT: 36.6 % (ref 35.0–45.0)
Hemoglobin: 12.6 g/dL (ref 11.7–15.5)
Lymphs Abs: 2462 cells/uL (ref 850–3900)
MCH: 30.7 pg (ref 27.0–33.0)
MCHC: 34.4 g/dL (ref 32.0–36.0)
MCV: 89.3 fL (ref 80.0–100.0)
MONOS PCT: 7.8 %
MPV: 9.3 fL (ref 7.5–12.5)
NEUTROS ABS: 4082 {cells}/uL (ref 1500–7800)
Neutrophils Relative %: 56.7 %
PLATELETS: 220 10*3/uL (ref 140–400)
RBC: 4.1 10*6/uL (ref 3.80–5.10)
RDW: 12.9 % (ref 11.0–15.0)
Total Lymphocyte: 34.2 %
WBC mixed population: 562 cells/uL (ref 200–950)
WBC: 7.2 10*3/uL (ref 3.8–10.8)

## 2018-03-29 LAB — MICROSCOPIC MESSAGE

## 2018-03-29 NOTE — Progress Notes (Signed)
Subjective:    Patient ID: Bonnie Watkins, female    DOB: 26-Jan-1935, 83 y.o.   MRN: [id number]  HPI 03/08/18 Patient presents today with several concerns.  First over the last month, she has had episodes of fecal incontinence.  Occasionally when she stands up, stool simply come out of her rectum.  At times will be watery.  At other times will be hard stool pellets.  She takes MiraLAX intermittently due to constipation.  Some days she will go 3 days without a bowel movement and then she will take MiraLAX.  However whenever she takes MiraLAX she will have diarrhea.  This prevents her from taking it every day.  Rectal exam is performed today.  She has weak sphincter tone.  When I asked her to clamp down on my finger, I can barely feel her do anything.  Therefore there may be decreased rectal tone accounting for some of her fecal incontinence.  However there is no stool in the rectal vault.  I can appreciate no impaction or mass or blockage.  She denies any melena or hematochezia.  She also complains of pain in her right shoulder with abduction greater than 90 degrees.  She is have pain with internal and external rotation.  Pain is been present now for more than a month.  At that time, my plan was: Obtain an x-ray of her abdomen to evaluate for severe constipation.  If the patient has severe constipation this could possibly be causing some of her fecal incontinence and I would try to regulate her bowel movements more consistently with a combination of fiber and switching MiraLAX to low-dose Linzess 74 mcg daily.  If the x-ray shows no significant stool burden or impaction, I would focus our intention on trying to improve her rectal tone by having the patient perform Keagle exercises to strengthen the rectal tone.  Regarding the pain in her right shoulder, I suspect impingement syndrome.  I recommended a cortisone injection of the right shoulder.  She agrees.  Using sterile technique, I injected the  subacromial space with a mixture of 2 cc of lidocaine, 2 cc of Marcaine, and 2 cc of 40 mg/mL Kenalog.  Patient tolerated the procedure well without complication  41/32/44 SNF reports increasing confusion and increased urinary frequency.  Concerned she may have UTI. PMH is complicated by schizophrenia and Alzheimer's Dementia.   In talking to the daughter, this is been a gradual problem that is slowly gotten worse.  Patient is accusing people of stealing from her when she misplaces items at the nursing home.  Symptoms sound more consistent with worsening dementia and memory loss rather than delirium secondary to an infection.  Today she is pleasant, awake, alert and answering questions.  She smiles on exam.  She denies any cough.  She denies any chest pain.  She denies any shortness of breath.  She denies any otalgia or rhinorrhea or sinus pain or sore throat.  She denies any nausea vomiting or diarrhea.  She denies any dysuria urgency or frequency.  Urinalysis shows trace leukocyte esterase but is otherwise negative.  Past Medical History:  Diagnosis Date  . ACE inhibitor-aggravated angioedema   . Dementia ([place])   . Depression   . Diabetes mellitus without complication ([place])   . Diverticulosis   . GERD (gastroesophageal reflux disease)   . Hypertension   . Osteoporosis   . Schizophrenia ([place])    No past surgical history on file. Current Outpatient Medications on File  Prior to Visit  Medication Sig Dispense Refill  . alendronate (FOSAMAX) 70 MG tablet TAKE 1 TAB EACH WEEK 30 MIN PRIOR TO BREAKFAST WITH LARGE GLASS OF WATER. REMAIN UPRIGHT. 12 tablet 3  . amLODipine (NORVASC) 10 MG tablet Take 1 tablet (10 mg total) by mouth daily. 90 tablet 3  . aspirin 81 MG EC tablet TAKE (1) TABLET BY MOUTH ONCE DAILY. 90 tablet 3  . B-D INSULIN SYRINGE 29G X 1/2" 1 ML MISC USE AS DIRECTED. 100 each 0  . benztropine (COGENTIN) 1 MG tablet Take 1 mg by mouth daily.     . Calcium Citrate-Vitamin D  (CITRACAL PETITES/VITAMIN D) 200-250 MG-UNIT TABS TAKE (2) TABLETS BY MOUTH TWICE DAILY. 120 each 5  . citalopram (CELEXA) 20 MG tablet Take 20 mg by mouth daily.    Marland Kitchen GAVILAX powder MIX 1 CAPFUL (17G) IN 8 OUNCES OF JUICE/WATER AND DRINK ONCE DAILY ASNEEDED FOR CONSTIPATION. 510 g 3  . LANTUS 100 UNIT/ML injection INJECT 10 UNITS SUBCUTANEOUSLY EACH MORNING. 10 mL 3  . levothyroxine (SYNTHROID, LEVOTHROID) 50 MCG tablet TAKE ONE TABLET BY MOUTH ONCE DAILY. 30 tablet 5  . linaclotide (LINZESS) 72 MCG capsule Take 1 capsule (72 mcg total) by mouth daily before breakfast. 30 capsule 2  . memantine (NAMENDA) 10 MG tablet Take 1 tablet (10 mg total) by mouth 2 (two) times daily. 60 tablet 2  . metFORMIN (GLUCOPHAGE) 1000 MG tablet TAKE (1) TABLET BY MOUTH TWICE DAILY. 60 tablet 3  . omeprazole (PRILOSEC) 40 MG capsule TAKE (1) CAPSULE BY MOUTH ONCE DAILY. 30 capsule 11  . perphenazine (TRILAFON) 2 MG tablet Take 5 mg by mouth every morning.     Vladimir Faster Glycol-Propyl Glycol (SYSTANE) 0.4-0.3 % SOLN Place 1 drop into both eyes 2 (two) times daily.    . QUEtiapine (SEROQUEL) 100 MG tablet Take 1 tablet (100 mg total) by mouth at bedtime. 30 tablet 5  . traMADol (ULTRAM) 50 MG tablet TAKE (1) TABLET BY MOUTH TWICE DAILY. 60 tablet 2   No current facility-administered medications on file prior to visit.    Allergies  Allergen Reactions  . Shellfish Allergy Anaphylaxis    ALL SEAFOOD ALLERGY  . Ace Inhibitors Other (See Comments)    On MAR  . Angiotensin Receptor Blockers Other (See Comments)    ON MAR  . Lisinopril     Angioedema  . Strawberry Extract Hives  . Robaxin [Methocarbamol] Rash   Social History   Socioeconomic History  . Marital status: Divorced    Spouse name: Not on file  . Number of children: Not on file  . Years of education: Not on file  . Highest education level: Not on file  Occupational History  . Not on file  Social Needs  . Financial resource strain: Not on  file  . Food insecurity:    Worry: Not on file    Inability: Not on file  . Transportation needs:    Medical: Not on file    Non-medical: Not on file  Tobacco Use  . Smoking status: Former Research scientist (life sciences)  . Smokeless tobacco: Never Used  Substance and Sexual Activity  . Alcohol use: No  . Drug use: No  . Sexual activity: Never    Comment: lives at [facility].    Lifestyle  . Physical activity:    Days per week: Not on file    Minutes per session: Not on file  . Stress: Not on file  Relationships  .  Social connections:    Talks on phone: Not on file    Gets together: Not on file    Attends religious service: Not on file    Active member of club or organization: Not on file    Attends meetings of clubs or organizations: Not on file    Relationship status: Not on file  . Intimate partner violence:    Fear of current or ex partner: Not on file    Emotionally abused: Not on file    Physically abused: Not on file    Forced sexual activity: Not on file  Other Topics Concern  . Not on file  Social History Narrative  . Not on file      Review of Systems  All other systems reviewed and are negative.      Objective:   Physical Exam  Constitutional: She is oriented to person, place, and time. She appears well-developed and well-nourished. No distress.  HENT:  Head: Normocephalic and atraumatic.  Right Ear: External ear normal.  Left Ear: External ear normal.  Nose: Nose normal.  Mouth/Throat: Oropharynx is clear and moist. No oropharyngeal exudate.  Eyes: Pupils are equal, round, and reactive to light. Conjunctivae and EOM are normal. Right eye exhibits no discharge. Left eye exhibits no discharge. No scleral icterus.  Neck: Neck supple. No JVD present. No thyromegaly present.  Cardiovascular: Normal rate, regular rhythm and normal heart sounds. Exam reveals no gallop and no friction rub.  No murmur heard. Pulmonary/Chest: Effort normal and breath sounds normal. No  stridor. No respiratory distress. She has no wheezes. She has no rales.  Abdominal: Soft. Bowel sounds are normal. She exhibits no distension and no mass. There is no tenderness. There is no rebound and no guarding.  Lymphadenopathy:    She has no cervical adenopathy.  Neurological: She is alert and oriented to person, place, and time. No cranial nerve deficit or sensory deficit. She exhibits normal muscle tone. Coordination normal.  Skin: No rash noted. She is not diaphoretic. No erythema.  Vitals reviewed.         Assessment & Plan:  Frequent urination - Plan: Urinalysis, Routine w reflex microscopic  Acute confusion - Plan: Urinalysis, Routine w reflex microscopic, CBC with Differential/Platelet, COMPLETE METABOLIC PANEL WITH GFR  Alzheimer's dementia without behavioral disturbance, unspecified timing of dementia onset ([place])  Urinalysis does not suggest a urinary tract infection.  Exam shows no reversible cause of delirium.  I suspect this represents worsening memory loss due to her Alzheimer's disease coupled with her schizophrenia.  I will obtain a CBC to evaluate for any leukocytosis that would suggest an underlying infection.  I will also check a CMP to check for any electrolyte abnormalities, evidence of dehydration, liver issues that may create or exacerbate confusion.  If lab work is normal, patient has an appointment to see her psychiatrist in December and I would recommend possibly uptitrating medication such as Seroquel to combat sundowning.

## 2018-03-29 NOTE — Addendum Note (Signed)
Addended by: Shary Decamp B on: 03/29/2018 03:41 PM   Modules accepted: Orders

## 2018-03-31 LAB — URINE CULTURE
MICRO NUMBER:: 91386137
Result:: NO GROWTH
SPECIMEN QUALITY:: ADEQUATE

## 2018-04-19 ENCOUNTER — Other Ambulatory Visit: Payer: Self-pay | Admitting: Family Medicine

## 2018-04-19 DIAGNOSIS — H3509 Other intraretinal microvascular abnormalities: Secondary | ICD-10-CM | POA: Diagnosis not present

## 2018-04-19 DIAGNOSIS — H35033 Hypertensive retinopathy, bilateral: Secondary | ICD-10-CM | POA: Diagnosis not present

## 2018-04-19 DIAGNOSIS — H40013 Open angle with borderline findings, low risk, bilateral: Secondary | ICD-10-CM | POA: Diagnosis not present

## 2018-04-19 DIAGNOSIS — E113293 Type 2 diabetes mellitus with mild nonproliferative diabetic retinopathy without macular edema, bilateral: Secondary | ICD-10-CM | POA: Diagnosis not present

## 2018-04-19 DIAGNOSIS — Z961 Presence of intraocular lens: Secondary | ICD-10-CM | POA: Diagnosis not present

## 2018-05-06 DIAGNOSIS — F251 Schizoaffective disorder, depressive type: Secondary | ICD-10-CM | POA: Diagnosis not present

## 2018-05-10 ENCOUNTER — Other Ambulatory Visit: Payer: Self-pay | Admitting: Family Medicine

## 2018-05-24 ENCOUNTER — Encounter: Payer: Self-pay | Admitting: Family Medicine

## 2018-05-24 ENCOUNTER — Ambulatory Visit (INDEPENDENT_AMBULATORY_CARE_PROVIDER_SITE_OTHER): Payer: Medicare Other | Admitting: Family Medicine

## 2018-05-24 ENCOUNTER — Ambulatory Visit
Admission: RE | Admit: 2018-05-24 | Discharge: 2018-05-24 | Disposition: A | Discharge status: Home / Self Care | Payer: Medicare Other | Source: Ambulatory Visit | Attending: Family Medicine | Admitting: Family Medicine

## 2018-05-24 VITALS — BP 120/62 | HR 72 | Temp 98.2°F | Resp 16 | Ht 67.0 in | Wt 242.0 lb

## 2018-05-24 DIAGNOSIS — E119 Type 2 diabetes mellitus without complications: Secondary | ICD-10-CM

## 2018-05-24 DIAGNOSIS — IMO0001 Reserved for inherently not codable concepts without codable children: Secondary | ICD-10-CM

## 2018-05-24 DIAGNOSIS — M79671 Pain in right foot: Secondary | ICD-10-CM

## 2018-05-24 DIAGNOSIS — Z794 Long term (current) use of insulin: Secondary | ICD-10-CM

## 2018-05-24 DIAGNOSIS — E038 Other specified hypothyroidism: Secondary | ICD-10-CM | POA: Diagnosis not present

## 2018-05-24 DIAGNOSIS — M19071 Primary osteoarthritis, right ankle and foot: Secondary | ICD-10-CM | POA: Diagnosis not present

## 2018-05-24 MED ORDER — SOLIFENACIN SUCCINATE 10 MG PO TABS: 10.0000 mg | ORAL_TABLET | Freq: Every day | ORAL | 3 refills | Status: DC

## 2018-05-24 NOTE — Progress Notes (Addendum)
Subjective:    Patient ID: Bonnie Watkins, female    DOB: [birth year], 84 y.o.   MRN: [id number]  HPI 03/08/18 Patient presents today with several concerns.  First over the last month, she has had episodes of fecal incontinence.  Occasionally when she stands up, stool simply come out of her rectum.  At times will be watery.  At other times will be hard stool pellets.  She takes MiraLAX intermittently due to constipation.  Some days she will go 3 days without a bowel movement and then she will take MiraLAX.  However whenever she takes MiraLAX she will have diarrhea.  This prevents her from taking it every day.  Rectal exam is performed today.  She has weak sphincter tone.  When I asked her to clamp down on my finger, I can barely feel her do anything.  Therefore there may be decreased rectal tone accounting for some of her fecal incontinence.  However there is no stool in the rectal vault.  I can appreciate no impaction or mass or blockage.  She denies any melena or hematochezia.  She also complains of pain in her right shoulder with abduction greater than 90 degrees.  She is have pain with internal and external rotation.  Pain is been present now for more than a month.  At that time, my plan was: Obtain an x-ray of her abdomen to evaluate for severe constipation.  If the patient has severe constipation this could possibly be causing some of her fecal incontinence and I would try to regulate her bowel movements more consistently with a combination of fiber and switching MiraLAX to low-dose Linzess 74 mcg daily.  If the x-ray shows no significant stool burden or impaction, I would focus our intention on trying to improve her rectal tone by having the patient perform Keagle exercises to strengthen the rectal tone.  Regarding the pain in her right shoulder, I suspect impingement syndrome.  I recommended a cortisone injection of the right shoulder.  She agrees.  Using sterile technique, I injected the  subacromial space with a mixture of 2 cc of lidocaine, 2 cc of Marcaine, and 2 cc of 40 mg/mL Kenalog.  Patient tolerated the procedure well without complication  32/12/24 SNF reports increasing confusion and increased urinary frequency.  Concerned she may have UTI. PMH is complicated by schizophrenia and Alzheimer's Dementia.   In talking to the daughter, this is been a gradual problem that is slowly gotten worse.  Patient is accusing people of stealing from her when she misplaces items at the nursing home.  Symptoms sound more consistent with worsening dementia and memory loss rather than delirium secondary to an infection.  Today she is pleasant, awake, alert and answering questions.  She smiles on exam.  She denies any cough.  She denies any chest pain.  She denies any shortness of breath.  She denies any otalgia or rhinorrhea or sinus pain or sore throat.  She denies any nausea vomiting or diarrhea.  She denies any dysuria urgency or frequency.  Urinalysis shows trace leukocyte esterase but is otherwise negative.  05/24/17 Daughter continues to report increased urinary frequency.  She is soaking through adult diapers daily.  She is constantly having to change them.  Patient states that she can feel the urge to urinate however before she can make it to the bathroom, she is already wet herself.  She denies any dysuria.  She denies any hesitancy.  She denies any hematuria.  Work-up  last in September revealed no complications from a metabolic standpoint that would cause polyuria.  Concern is that she may have overactive bladder.  However given her schizophrenia I am also concerned about adding medication for overactive bladder exacerbating memory loss and confusion.  Patient also reports pain and swelling in her right foot.  The pain is on the dorsum of the foot distal to the midfoot but prior to the MTP joints.  It is over the fourth and fifth metatarsals.  Daughter states that last week it was bruised and  swollen and tender to touch.  Today it is still tender to the touch on the dorsum of the foot.  She is able to wiggle her toes.  Pulses are normal.  Bruising has improved and faded.  She denies any falls or injuries.  Patient has also gained approximately 30 pounds since May.  Her thyroid has not been checked since May.  She is not exercising and she is eating more.  Past Medical History:  Diagnosis Date  . ACE inhibitor-aggravated angioedema   . Dementia ([place])   . Depression   . Diabetes mellitus without complication ([place])   . Diverticulosis   . GERD (gastroesophageal reflux disease)   . Hypertension   . Osteoporosis   . Schizophrenia ([place])    No past surgical history on file. Current Outpatient Medications on File Prior to Visit  Medication Sig Dispense Refill  . alendronate (FOSAMAX) 70 MG tablet TAKE 1 TAB EACH WEEK 30 MIN PRIOR TO BREAKFAST WITH LARGE GLASS OF WATER. REMAIN UPRIGHT. 12 tablet 3  . amLODipine (NORVASC) 10 MG tablet Take 1 tablet (10 mg total) by mouth daily. 90 tablet 3  . aspirin 81 MG EC tablet TAKE (1) TABLET BY MOUTH ONCE DAILY. 90 tablet 3  . B-D INSULIN SYRINGE 29G X 1/2" 1 ML MISC USE AS DIRECTED. 100 each 0  . benztropine (COGENTIN) 1 MG tablet Take 1 mg by mouth daily.     . Calcium Citrate-Vitamin D (CITRACAL PETITES/VITAMIN D) 200-250 MG-UNIT TABS TAKE (2) TABLETS BY MOUTH TWICE DAILY. 120 each 5  . citalopram (CELEXA) 20 MG tablet Take 20 mg by mouth daily.    Marland Kitchen GAVILAX powder MIX 1 CAPFUL (17G) IN 8 OUNCES OF JUICE/WATER AND DRINK ONCE DAILY ASNEEDED FOR CONSTIPATION. 510 g 3  . LANTUS 100 UNIT/ML injection INJECT 10 UNITS SUBCUTANEOUSLY EACH MORNING. 10 mL 2  . levothyroxine (SYNTHROID, LEVOTHROID) 50 MCG tablet TAKE ONE TABLET BY MOUTH ONCE DAILY. 30 tablet 5  . LINZESS 72 MCG capsule TAKE (1) CAPSULE BY MOUTH DAILY BEFORE BREAKFAST. 30 capsule 5  . memantine (NAMENDA) 10 MG tablet Take 1 tablet (10 mg total) by mouth 2 (two) times daily. 60 tablet 2    . metFORMIN (GLUCOPHAGE) 1000 MG tablet TAKE (1) TABLET BY MOUTH TWICE DAILY. 60 tablet 3  . omeprazole (PRILOSEC) 40 MG capsule TAKE (1) CAPSULE BY MOUTH ONCE DAILY. 30 capsule 11  . perphenazine (TRILAFON) 2 MG tablet Take 5 mg by mouth every morning.     Vladimir Faster Glycol-Propyl Glycol (SYSTANE) 0.4-0.3 % SOLN Place 1 drop into both eyes 2 (two) times daily.    . Psyllium Fiber 0.52 g CAPS TAKE (1) CAPSULE BY MOUTH THREE TIMES A DAY WITH FULL 8OZ OF WATER. 90 capsule 5  . QUEtiapine (SEROQUEL) 100 MG tablet Take 1 tablet (100 mg total) by mouth at bedtime. 30 tablet 5  . traMADol (ULTRAM) 50 MG tablet TAKE (1) TABLET BY MOUTH  TWICE DAILY. 60 tablet 2   No current facility-administered medications on file prior to visit.    Allergies  Allergen Reactions  . Shellfish Allergy Anaphylaxis    ALL SEAFOOD ALLERGY  . Ace Inhibitors Other (See Comments)    On MAR  . Angiotensin Receptor Blockers Other (See Comments)    ON MAR  . Lisinopril     Angioedema  . Strawberry Extract Hives  . Robaxin [Methocarbamol] Rash   Social History   Socioeconomic History  . Marital status: Divorced    Spouse name: Not on file  . Number of children: Not on file  . Years of education: Not on file  . Highest education level: Not on file  Occupational History  . Not on file  Social Needs  . Financial resource strain: Not on file  . Food insecurity:    Worry: Not on file    Inability: Not on file  . Transportation needs:    Medical: Not on file    Non-medical: Not on file  Tobacco Use  . Smoking status: Former Research scientist (life sciences)  . Smokeless tobacco: Never Used  Substance and Sexual Activity  . Alcohol use: No  . Drug use: No  . Sexual activity: Never    Comment: lives at [facility].    Lifestyle  . Physical activity:    Days per week: Not on file    Minutes per session: Not on file  . Stress: Not on file  Relationships  . Social connections:    Talks on phone: Not on file    Gets together:  Not on file    Attends religious service: Not on file    Active member of club or organization: Not on file    Attends meetings of clubs or organizations: Not on file    Relationship status: Not on file  . Intimate partner violence:    Fear of current or ex partner: Not on file    Emotionally abused: Not on file    Physically abused: Not on file    Forced sexual activity: Not on file  Other Topics Concern  . Not on file  Social History Narrative  . Not on file      Review of Systems  All other systems reviewed and are negative.      Objective:   Physical Exam Vitals signs reviewed.  Constitutional:      General: She is not in acute distress.    Appearance: She is well-developed. She is not diaphoretic.  HENT:     Head: Normocephalic and atraumatic.     Right Ear: External ear normal.     Left Ear: External ear normal.     Nose: Nose normal.     Mouth/Throat:     Pharynx: No oropharyngeal exudate.  Eyes:     General: No scleral icterus.       Right eye: No discharge.        Left eye: No discharge.     Conjunctiva/sclera: Conjunctivae normal.     Pupils: Pupils are equal, round, and reactive to light.  Neck:     Musculoskeletal: Neck supple.     Thyroid: No thyromegaly.     Vascular: No JVD.  Cardiovascular:     Rate and Rhythm: Normal rate and regular rhythm.     Heart sounds: Normal heart sounds. No murmur. No friction rub. No gallop.   Pulmonary:     Effort: Pulmonary effort is normal. No respiratory distress.  Breath sounds: Normal breath sounds. No stridor. No wheezing or rales.  Abdominal:     General: Bowel sounds are normal. There is no distension.     Palpations: Abdomen is soft. There is no mass.     Tenderness: There is no abdominal tenderness. There is no guarding or rebound.  Musculoskeletal:     Right foot: Tenderness, bony tenderness and swelling present.       Feet:  Lymphadenopathy:     Cervical: No cervical adenopathy.  Skin:     Findings: No erythema or rash.  Neurological:     Mental Status: She is alert and oriented to person, place, and time.     Cranial Nerves: No cranial nerve deficit.     Sensory: No sensory deficit.     Motor: No abnormal muscle tone.     Coordination: Coordination normal.           Assessment & Plan:  Other specified hypothyroidism - Plan: TSH  Diabetes mellitus, insulin dependent (IDDM), controlled ([place]) - Plan: Hemoglobin A1c, CBC with Differential/Platelet, COMPLETE METABOLIC PANEL WITH GFR  Right foot pain - Plan: DG Foot Complete Right  I suspect that the patient has overactive bladder syndrome causing her urinary incontinence coupled with her dementia  I am concerned that the patient may have sprained her right foot but I cannot exclude a stress fracture  Patient has significant weight gain   Begin by checking her TSH to ensure that her thyroid dose is correct for the weight gain.  Also check management of her diabetes to ensure that her hemoglobin A1c is not out of control.  Because the patient is on insulin, she is checking her sugars to 3 times a day at her assisted living facility to prevent hypoglycemia.  She needs to continue to check her sugars 2-3 times a day.  Patient may benefit from Trulicity to help lose weight.  There is no evidence of congestive heart failure.  There is no pitting edema, pulmonary edema, or shortness of breath.  This seems to be obesity and body weight that is gained.  The foot pain, I will obtain an x-ray to rule out a stress fracture.  Likely will recommend hard soled shoes and elevation until the swelling has improved if it simply a sprain.  For the overactive bladder, we could try Vesicare 10 mg a day and reassess in 3 weeks.  Watch closely for worsening confusion

## 2018-05-25 ENCOUNTER — Other Ambulatory Visit: Payer: Self-pay | Admitting: Family Medicine

## 2018-05-25 LAB — COMPLETE METABOLIC PANEL WITH GFR
AG Ratio: 1.3 (calc) (ref 1.0–2.5)
ALBUMIN MSPROF: 4.1 g/dL (ref 3.6–5.1)
ALT: 12 U/L (ref 6–29)
AST: 16 U/L (ref 10–35)
Alkaline phosphatase (APISO): 52 U/L (ref 33–130)
BILIRUBIN TOTAL: 0.4 mg/dL (ref 0.2–1.2)
BUN: 15 mg/dL (ref 7–25)
CHLORIDE: 95 mmol/L — AB (ref 98–110)
CO2: 28 mmol/L (ref 20–32)
CREATININE: 0.66 mg/dL (ref 0.60–0.88)
Calcium: 9.4 mg/dL (ref 8.6–10.4)
GFR, Est African American: 96 mL/min/{1.73_m2} (ref >=60)
GFR, Est Non African American: 83 mL/min/{1.73_m2} (ref >=60)
Globulin: 3.1 g/dL (calc) (ref 1.9–3.7)
Glucose, Bld: 124 mg/dL — ABNORMAL HIGH (ref 65–99)
Potassium: 4.4 mmol/L (ref 3.5–5.3)
Sodium: 132 mmol/L — ABNORMAL LOW (ref 135–146)
Total Protein: 7.2 g/dL (ref 6.1–8.1)

## 2018-05-25 LAB — HEMOGLOBIN A1C
EAG (MMOL/L): 7.9 (calc)
HEMOGLOBIN A1C: 6.6 %{Hb} — AB (ref <5.7)
MEAN PLASMA GLUCOSE: 143 (calc)

## 2018-05-25 LAB — CBC WITH DIFFERENTIAL/PLATELET
Absolute Monocytes: 578 cells/uL (ref 200–950)
BASOS ABS: 31 {cells}/uL (ref 0–200)
Basophils Relative: 0.4 %
EOS PCT: 1.2 %
Eosinophils Absolute: 92 cells/uL (ref 15–500)
HEMATOCRIT: 37.8 % (ref 35.0–45.0)
Hemoglobin: 12.7 g/dL (ref 11.7–15.5)
Lymphs Abs: 2487 cells/uL (ref 850–3900)
MCH: 30.8 pg (ref 27.0–33.0)
MCHC: 33.6 g/dL (ref 32.0–36.0)
MCV: 91.5 fL (ref 80.0–100.0)
MPV: 10 fL (ref 7.5–12.5)
Monocytes Relative: 7.5 %
NEUTROS PCT: 58.6 %
Neutro Abs: 4512 cells/uL (ref 1500–7800)
Platelets: 250 10*3/uL (ref 140–400)
RBC: 4.13 10*6/uL (ref 3.80–5.10)
RDW: 12.7 % (ref 11.0–15.0)
Total Lymphocyte: 32.3 %
WBC: 7.7 10*3/uL (ref 3.8–10.8)

## 2018-05-25 LAB — TSH: TSH: 1.66 m[IU]/L (ref 0.40–4.50)

## 2018-05-25 MED ORDER — MIRABEGRON ER 25 MG PO TB24: 25.0000 mg | ORAL_TABLET | Freq: Every day | ORAL | 3 refills | Status: DC

## 2018-05-25 NOTE — Telephone Encounter (Signed)
[facility] called and states that Vesicare is not covered under pt's ins however Myrbetriq is and would like to change it. Per Dr. Dennard Schaumann ok to change - Myrbetriq 25mg  qd. Rx sent to pharm.

## 2018-06-11 ENCOUNTER — Other Ambulatory Visit: Payer: Self-pay | Admitting: Family Medicine

## 2018-09-27 ENCOUNTER — Other Ambulatory Visit: Payer: Self-pay | Admitting: Family Medicine

## 2018-10-19 ENCOUNTER — Telehealth: Payer: Self-pay | Admitting: Family Medicine

## 2018-10-19 NOTE — Telephone Encounter (Signed)
Pts daughter needs fmla paperwork filled out, placed into yellow folder.

## 2018-10-20 NOTE — Telephone Encounter (Signed)
Received fax from [facility] for [place] forms for patient daughter, Vicente Males.   Reason FMLA requested: Patient has multiple medical issues that require frequent follow up appointments. Patient daughter is providing transportation to and from appointments. Patient daughter also manages patient finances.  Requested Beginning Date: intermittent to cover frequent F/U appointments  Verbalized that fee may be charged and is per provider prerogative.   Forms routed to provider.

## 2018-10-21 NOTE — Telephone Encounter (Signed)
Received completed FMLA from provider.   No charge per provider for this form, but another FMLA was competed as well. Patient daughter contacted to pay fee.   States that she will come by office to pay fee and pick up forms.

## 2018-11-29 DIAGNOSIS — M6281 Muscle weakness (generalized): Secondary | ICD-10-CM | POA: Diagnosis not present

## 2018-11-29 DIAGNOSIS — R2689 Other abnormalities of gait and mobility: Secondary | ICD-10-CM | POA: Diagnosis not present

## 2018-11-30 DIAGNOSIS — M6281 Muscle weakness (generalized): Secondary | ICD-10-CM | POA: Diagnosis not present

## 2018-11-30 DIAGNOSIS — R2689 Other abnormalities of gait and mobility: Secondary | ICD-10-CM | POA: Diagnosis not present

## 2018-12-02 DIAGNOSIS — M6281 Muscle weakness (generalized): Secondary | ICD-10-CM | POA: Diagnosis not present

## 2018-12-02 DIAGNOSIS — R2689 Other abnormalities of gait and mobility: Secondary | ICD-10-CM | POA: Diagnosis not present

## 2018-12-06 DIAGNOSIS — R2689 Other abnormalities of gait and mobility: Secondary | ICD-10-CM | POA: Diagnosis not present

## 2018-12-06 DIAGNOSIS — M6281 Muscle weakness (generalized): Secondary | ICD-10-CM | POA: Diagnosis not present

## 2018-12-07 DIAGNOSIS — R2689 Other abnormalities of gait and mobility: Secondary | ICD-10-CM | POA: Diagnosis not present

## 2018-12-07 DIAGNOSIS — M6281 Muscle weakness (generalized): Secondary | ICD-10-CM | POA: Diagnosis not present

## 2018-12-09 DIAGNOSIS — R2689 Other abnormalities of gait and mobility: Secondary | ICD-10-CM | POA: Diagnosis not present

## 2018-12-09 DIAGNOSIS — M6281 Muscle weakness (generalized): Secondary | ICD-10-CM | POA: Diagnosis not present

## 2018-12-10 ENCOUNTER — Telehealth: Payer: Self-pay

## 2018-12-10 DIAGNOSIS — F251 Schizoaffective disorder, depressive type: Secondary | ICD-10-CM | POA: Diagnosis not present

## 2018-12-10 NOTE — Telephone Encounter (Signed)
Form faxed to [facility].

## 2018-12-13 DIAGNOSIS — R2689 Other abnormalities of gait and mobility: Secondary | ICD-10-CM | POA: Diagnosis not present

## 2018-12-13 DIAGNOSIS — M6281 Muscle weakness (generalized): Secondary | ICD-10-CM | POA: Diagnosis not present

## 2018-12-14 DIAGNOSIS — M6281 Muscle weakness (generalized): Secondary | ICD-10-CM | POA: Diagnosis not present

## 2018-12-14 DIAGNOSIS — R2689 Other abnormalities of gait and mobility: Secondary | ICD-10-CM | POA: Diagnosis not present

## 2018-12-15 DIAGNOSIS — M6281 Muscle weakness (generalized): Secondary | ICD-10-CM | POA: Diagnosis not present

## 2018-12-15 DIAGNOSIS — R2689 Other abnormalities of gait and mobility: Secondary | ICD-10-CM | POA: Diagnosis not present

## 2018-12-21 DIAGNOSIS — M6281 Muscle weakness (generalized): Secondary | ICD-10-CM | POA: Diagnosis not present

## 2018-12-21 DIAGNOSIS — R2689 Other abnormalities of gait and mobility: Secondary | ICD-10-CM | POA: Diagnosis not present

## 2018-12-23 DIAGNOSIS — M6281 Muscle weakness (generalized): Secondary | ICD-10-CM | POA: Diagnosis not present

## 2018-12-23 DIAGNOSIS — R2689 Other abnormalities of gait and mobility: Secondary | ICD-10-CM | POA: Diagnosis not present

## 2018-12-24 DIAGNOSIS — R2689 Other abnormalities of gait and mobility: Secondary | ICD-10-CM | POA: Diagnosis not present

## 2018-12-24 DIAGNOSIS — M6281 Muscle weakness (generalized): Secondary | ICD-10-CM | POA: Diagnosis not present

## 2018-12-28 DIAGNOSIS — M6281 Muscle weakness (generalized): Secondary | ICD-10-CM | POA: Diagnosis not present

## 2018-12-28 DIAGNOSIS — R2689 Other abnormalities of gait and mobility: Secondary | ICD-10-CM | POA: Diagnosis not present

## 2018-12-29 ENCOUNTER — Other Ambulatory Visit: Payer: Self-pay

## 2018-12-29 DIAGNOSIS — R41841 Cognitive communication deficit: Secondary | ICD-10-CM | POA: Diagnosis not present

## 2018-12-29 DIAGNOSIS — F0280 Dementia in other diseases classified elsewhere without behavioral disturbance: Secondary | ICD-10-CM | POA: Diagnosis not present

## 2018-12-30 ENCOUNTER — Ambulatory Visit
Admission: RE | Admit: 2018-12-30 | Discharge: 2018-12-30 | Disposition: A | Discharge status: Home / Self Care | Payer: Medicare Other | Source: Ambulatory Visit | Attending: Family Medicine | Admitting: Family Medicine

## 2018-12-30 ENCOUNTER — Ambulatory Visit (INDEPENDENT_AMBULATORY_CARE_PROVIDER_SITE_OTHER): Payer: Medicare Other | Admitting: Family Medicine

## 2018-12-30 VITALS — BP 150/50 | HR 72 | Temp 97.9°F | Resp 18 | Ht 67.0 in | Wt 242.0 lb

## 2018-12-30 DIAGNOSIS — F0280 Dementia in other diseases classified elsewhere without behavioral disturbance: Secondary | ICD-10-CM | POA: Diagnosis not present

## 2018-12-30 DIAGNOSIS — M25511 Pain in right shoulder: Secondary | ICD-10-CM | POA: Diagnosis not present

## 2018-12-30 DIAGNOSIS — Z23 Encounter for immunization: Secondary | ICD-10-CM

## 2018-12-30 DIAGNOSIS — M25552 Pain in left hip: Secondary | ICD-10-CM | POA: Diagnosis not present

## 2018-12-30 DIAGNOSIS — E119 Type 2 diabetes mellitus without complications: Secondary | ICD-10-CM

## 2018-12-30 DIAGNOSIS — R2689 Other abnormalities of gait and mobility: Secondary | ICD-10-CM | POA: Diagnosis not present

## 2018-12-30 DIAGNOSIS — G309 Alzheimer's disease, unspecified: Secondary | ICD-10-CM

## 2018-12-30 DIAGNOSIS — E038 Other specified hypothyroidism: Secondary | ICD-10-CM

## 2018-12-30 DIAGNOSIS — M6281 Muscle weakness (generalized): Secondary | ICD-10-CM | POA: Diagnosis not present

## 2018-12-30 DIAGNOSIS — S79912A Unspecified injury of left hip, initial encounter: Secondary | ICD-10-CM | POA: Diagnosis not present

## 2018-12-30 DIAGNOSIS — G8929 Other chronic pain: Secondary | ICD-10-CM

## 2018-12-30 NOTE — Progress Notes (Signed)
Subjective:    Patient ID: Bonnie Watkins, female    DOB: 1934/05/17, 84 y.o.   MRN: [id number]  HPI 03/08/18 Patient presents today with several concerns.  First over the last month, she has had episodes of fecal incontinence.  Occasionally when she stands up, stool simply come out of her rectum.  At times will be watery.  At other times will be hard stool pellets.  She takes MiraLAX intermittently due to constipation.  Some days she will go 3 days without a bowel movement and then she will take MiraLAX.  However whenever she takes MiraLAX she will have diarrhea.  This prevents her from taking it every day.  Rectal exam is performed today.  She has weak sphincter tone.  When I asked her to clamp down on my finger, I can barely feel her do anything.  Therefore there may be decreased rectal tone accounting for some of her fecal incontinence.  However there is no stool in the rectal vault.  I can appreciate no impaction or mass or blockage.  She denies any melena or hematochezia.  She also complains of pain in her right shoulder with abduction greater than 90 degrees.  She is have pain with internal and external rotation.  Pain is been present now for more than a month.  At that time, my plan was: Obtain an x-ray of her abdomen to evaluate for severe constipation.  If the patient has severe constipation this could possibly be causing some of her fecal incontinence and I would try to regulate her bowel movements more consistently with a combination of fiber and switching MiraLAX to low-dose Linzess 74 mcg daily.  If the x-ray shows no significant stool burden or impaction, I would focus our intention on trying to improve her rectal tone by having the patient perform Keagle exercises to strengthen the rectal tone.  Regarding the pain in her right shoulder, I suspect impingement syndrome.  I recommended a cortisone injection of the right shoulder.  She agrees.  Using sterile technique, I injected the  subacromial space with a mixture of 2 cc of lidocaine, 2 cc of Marcaine, and 2 cc of 40 mg/mL Kenalog.  Patient tolerated the procedure well without complication  12/24/46 SNF reports increasing confusion and increased urinary frequency.  Concerned she may have UTI. PMH is complicated by schizophrenia and Alzheimer's Dementia.   In talking to the daughter, this is been a gradual problem that is slowly gotten worse.  Patient is accusing people of stealing from her when she misplaces items at the nursing home.  Symptoms sound more consistent with worsening dementia and memory loss rather than delirium secondary to an infection.  Today she is pleasant, awake, alert and answering questions.  She smiles on exam.  She denies any cough.  She denies any chest pain.  She denies any shortness of breath.  She denies any otalgia or rhinorrhea or sinus pain or sore throat.  She denies any nausea vomiting or diarrhea.  She denies any dysuria urgency or frequency.  Urinalysis shows trace leukocyte esterase but is otherwise negative.  05/24/17 Daughter continues to report increased urinary frequency.  She is soaking through adult diapers daily.  She is constantly having to change them.  Patient states that she can feel the urge to urinate however before she can make it to the bathroom, she is already wet herself.  She denies any dysuria.  She denies any hesitancy.  She denies any hematuria.  Work-up  last in September revealed no complications from a metabolic standpoint that would cause polyuria.  Concern is that she may have overactive bladder.  However given her schizophrenia I am also concerned about adding medication for overactive bladder exacerbating memory loss and confusion.  Patient also reports pain and swelling in her right foot.  The pain is on the dorsum of the foot distal to the midfoot but prior to the MTP joints.  It is over the fourth and fifth metatarsals.  Daughter states that last week it was bruised and  swollen and tender to touch.  Today it is still tender to the touch on the dorsum of the foot.  She is able to wiggle her toes.  Pulses are normal.  Bruising has improved and faded.  She denies any falls or injuries.  Patient has also gained approximately 30 pounds since May.  Her thyroid has not been checked since May.  She is not exercising and she is eating more.  At that time, my plan was: I suspect that the patient has overactive bladder syndrome causing her urinary incontinence coupled with her dementia  I am concerned that the patient may have sprained her right foot but I cannot exclude a stress fracture  Patient has significant weight gain  Begin by checking her TSH to ensure that her thyroid dose is correct for the weight gain.  Also check management of her diabetes to ensure that her hemoglobin A1c is not out of control.  Because the patient is on insulin, she is checking her sugars to 3 times a day at her assisted living facility to prevent hypoglycemia.  She needs to continue to check her sugars 2-3 times a day.  Patient may benefit from Trulicity to help lose weight.  There is no evidence of congestive heart failure.  There is no pitting edema, pulmonary edema, or shortness of breath.  This seems to be obesity and body weight that is gained.  The foot pain, I will obtain an x-ray to rule out a stress fracture.  Likely will recommend hard soled shoes and elevation until the swelling has improved if it simply a sprain.  For the overactive bladder, we could try Vesicare 10 mg a day and reassess in 3 weeks.  Watch closely for worsening confusion  12/30/18 Patient is on Myrbetriq instead of Vesicare.  Patient fell 2 times since August 8.  One time she slid off her shower chair.  The second time she fell trying to get off the toilet.  Her daughter states that she is more unstable on her feet recently.  She is receiving physical therapy at the skilled nursing facility however they are here today to  evaluate the pain in her posterior left hip.  This is where she fell.  The pain is located over the ischial spine.  She denies any pain with hip flexion, internal rotation, external rotation.  There is no tenderness to palpation.  She is able to bear weight on the hip.  She does complain of pain in her right shoulder which is chronic.  She has pain with abduction greater than 90 degrees.  She also has pain with internal and external rotation of the right shoulder.  She is due for fasting lab work.  The last time I try to make medication changes including reducing Seroquel and the trilafon, the patient experienced significant hallucinations and confusion Past Medical History:  Diagnosis Date  . ACE inhibitor-aggravated angioedema   . Dementia ([place])   .  Depression   . Diabetes mellitus without complication ([place])   . Diverticulosis   . GERD (gastroesophageal reflux disease)   . Hypertension   . Osteoporosis   . Schizophrenia ([place])    No past surgical history on file. Current Outpatient Medications on File Prior to Visit  Medication Sig Dispense Refill  . alendronate (FOSAMAX) 70 MG tablet TAKE 1 TAB EACH WEEK 30 MIN PRIOR TO BREAKFAST WITH LARGE GLASS OF WATER. REMAIN UPRIGHT. 12 tablet 3  . amLODipine (NORVASC) 10 MG tablet Take 1 tablet (10 mg total) by mouth daily. 90 tablet 3  . aspirin 81 MG EC tablet TAKE (1) TABLET BY MOUTH ONCE DAILY. 90 tablet 3  . B-D INSULIN SYRINGE 29G X 1/2" 1 ML MISC USE AS DIRECTED. 100 each 0  . benztropine (COGENTIN) 1 MG tablet Take 1 mg by mouth daily.     . Calcium Citrate-Vitamin D (CITRACAL PETITES/VITAMIN D) 200-250 MG-UNIT TABS TAKE (2) TABLETS BY MOUTH TWICE DAILY. 120 each 5  . citalopram (CELEXA) 20 MG tablet Take 20 mg by mouth daily.    Marland Kitchen GAVILAX powder MIX 1 CAPFUL (17G) IN 8 OUNCES OF JUICE/WATER AND DRINK ONCE DAILY ASNEEDED FOR CONSTIPATION. 510 g 3  . LANTUS 100 UNIT/ML injection INJECT 10 UNITS SUBCUTANEOUSLY EACH MORNING. 10 mL 2  .  levothyroxine (SYNTHROID, LEVOTHROID) 50 MCG tablet TAKE ONE TABLET BY MOUTH ONCE DAILY. 30 tablet 5  . LINZESS 72 MCG capsule TAKE (1) CAPSULE BY MOUTH DAILY BEFORE BREAKFAST. 30 capsule 5  . memantine (NAMENDA) 10 MG tablet Take 1 tablet (10 mg total) by mouth 2 (two) times daily. 60 tablet 2  . metFORMIN (GLUCOPHAGE) 1000 MG tablet TAKE (1) TABLET BY MOUTH TWICE DAILY. 60 tablet 3  . mirabegron ER (MYRBETRIQ) 25 MG TB24 tablet Take 1 tablet (25 mg total) by mouth daily. 90 tablet 3  . omeprazole (PRILOSEC) 40 MG capsule TAKE (1) CAPSULE BY MOUTH ONCE DAILY. 30 capsule 11  . perphenazine (TRILAFON) 2 MG tablet Take 5 mg by mouth every morning.     Vladimir Faster Glycol-Propyl Glycol (SYSTANE) 0.4-0.3 % SOLN Place 1 drop into both eyes 2 (two) times daily.    . Psyllium Fiber 0.52 g CAPS TAKE (1) CAPSULE BY MOUTH THREE TIMES A DAY WITH FULL 8OZ OF WATER. 90 capsule 5  . QUEtiapine (SEROQUEL) 100 MG tablet Take 1 tablet (100 mg total) by mouth at bedtime. 30 tablet 5  . traMADol (ULTRAM) 50 MG tablet TAKE (1) TABLET BY MOUTH TWICE DAILY. 60 tablet 2   No current facility-administered medications on file prior to visit.     Allergies  Allergen Reactions  . Shellfish Allergy Anaphylaxis    ALL SEAFOOD ALLERGY  . Ace Inhibitors Other (See Comments)    On MAR  . Angiotensin Receptor Blockers Other (See Comments)    ON MAR  . Lisinopril     Angioedema  . Strawberry Extract Hives  . Robaxin [Methocarbamol] Rash   Social History   Socioeconomic History  . Marital status: Divorced    Spouse name: Not on file  . Number of children: Not on file  . Years of education: Not on file  . Highest education level: Not on file  Occupational History  . Not on file  Social Needs  . Financial resource strain: Not on file  . Food insecurity    Worry: Not on file    Inability: Not on file  . Transportation needs    Medical:  Not on file    Non-medical: Not on file  Tobacco Use  . Smoking status:  Former Research scientist (life sciences)  . Smokeless tobacco: Never Used  Substance and Sexual Activity  . Alcohol use: No  . Drug use: No  . Sexual activity: Never    Comment: lives at [facility].    Lifestyle  . Physical activity    Days per week: Not on file    Minutes per session: Not on file  . Stress: Not on file  Relationships  . Social Herbalist on phone: Not on file    Gets together: Not on file    Attends religious service: Not on file    Active member of club or organization: Not on file    Attends meetings of clubs or organizations: Not on file    Relationship status: Not on file  . Intimate partner violence    Fear of current or ex partner: Not on file    Emotionally abused: Not on file    Physically abused: Not on file    Forced sexual activity: Not on file  Other Topics Concern  . Not on file  Social History Narrative  . Not on file      Review of Systems  All other systems reviewed and are negative.      Objective:   Physical Exam Vitals signs reviewed.  Constitutional:      General: She is not in acute distress.    Appearance: She is well-developed. She is not diaphoretic.  HENT:     Head: Normocephalic and atraumatic.     Right Ear: External ear normal.     Left Ear: External ear normal.     Nose: Nose normal.     Mouth/Throat:     Pharynx: No oropharyngeal exudate.  Eyes:     General: No scleral icterus.       Right eye: No discharge.        Left eye: No discharge.     Conjunctiva/sclera: Conjunctivae normal.     Pupils: Pupils are equal, round, and reactive to light.  Neck:     Musculoskeletal: Neck supple.     Thyroid: No thyromegaly.     Vascular: No JVD.  Cardiovascular:     Rate and Rhythm: Normal rate and regular rhythm.     Heart sounds: Normal heart sounds. No murmur. No friction rub. No gallop.   Pulmonary:     Effort: Pulmonary effort is normal. No respiratory distress.     Breath sounds: Normal breath sounds. No stridor. No  wheezing or rales.  Abdominal:     General: Bowel sounds are normal. There is no distension.     Palpations: Abdomen is soft. There is no mass.     Tenderness: There is no abdominal tenderness. There is no guarding or rebound.  Musculoskeletal:     Right shoulder: She exhibits decreased range of motion, tenderness, pain and decreased strength.     Left hip: She exhibits normal range of motion, normal strength, no tenderness and no bony tenderness.  Lymphadenopathy:     Cervical: No cervical adenopathy.  Skin:    Findings: No erythema or rash.  Neurological:     Mental Status: She is alert and oriented to person, place, and time.     Cranial Nerves: No cranial nerve deficit.     Sensory: No sensory deficit.     Motor: No abnormal muscle tone.     Coordination:  Coordination normal.           Assessment & Plan:  The primary encounter diagnosis was Diabetes mellitus without complication ([place]). Diagnoses of Other specified hypothyroidism, Alzheimer's dementia without behavioral disturbance, unspecified timing of dementia onset ([place]), Pain of left hip joint, and Chronic right shoulder pain were also pertinent to this visit. Begin by obtaining an x-ray of the left hip.  I do not think the patient suffered a fracture.  I think she suffered a contusion but I would get the x-ray just to be thorough.  I believe the pain in her right shoulder is likely a rotator cuff tear.  Using sterile technique, I injected the right subacromial space with 2 cc lidocaine, 2 cc of Marcaine, and 2 cc of 40 mg/mL Kenalog.  She tolerated this procedure well without complication.  The patient received her flu shot today.  I do believe the polypharmacy is contributing some to her balance issues particular her antipsychotic medication.  Today there is no cogwheel rigidity on exam.  She denies any muscle stiffness.  Therefore I am going to temporarily discontinue benztropine to see if reducing some of the anticholinergic  effects may help improve her balance.  We will watch closely for rigidity and cogwheeling.  While the patient is here today I will also check her hemoglobin A1c and her thyroid.

## 2018-12-30 NOTE — Addendum Note (Signed)
Addended by: Shary Decamp B on: 12/30/2018 08:56 AM   Modules accepted: Orders

## 2018-12-31 DIAGNOSIS — R2689 Other abnormalities of gait and mobility: Secondary | ICD-10-CM | POA: Diagnosis not present

## 2018-12-31 DIAGNOSIS — M6281 Muscle weakness (generalized): Secondary | ICD-10-CM | POA: Diagnosis not present

## 2018-12-31 LAB — CBC WITH DIFFERENTIAL/PLATELET
Absolute Monocytes: 516 cells/uL (ref 200–950)
Basophils Absolute: 34 cells/uL (ref 0–200)
Basophils Relative: 0.5 %
Eosinophils Absolute: 94 cells/uL (ref 15–500)
Eosinophils Relative: 1.4 %
HCT: 37.4 % (ref 35.0–45.0)
Hemoglobin: 12.3 g/dL (ref 11.7–15.5)
Lymphs Abs: 2157 cells/uL (ref 850–3900)
MCH: 30.1 pg (ref 27.0–33.0)
MCHC: 32.9 g/dL (ref 32.0–36.0)
MCV: 91.7 fL (ref 80.0–100.0)
MPV: 9.8 fL (ref 7.5–12.5)
Monocytes Relative: 7.7 %
Neutro Abs: 3899 cells/uL (ref 1500–7800)
Neutrophils Relative %: 58.2 %
Platelets: 215 10*3/uL (ref 140–400)
RBC: 4.08 10*6/uL (ref 3.80–5.10)
RDW: 12.9 % (ref 11.0–15.0)
Total Lymphocyte: 32.2 %
WBC: 6.7 10*3/uL (ref 3.8–10.8)

## 2018-12-31 LAB — COMPLETE METABOLIC PANEL WITH GFR
AG Ratio: 1.3 (calc) (ref 1.0–2.5)
ALT: 13 U/L (ref 6–29)
AST: 18 U/L (ref 10–35)
Albumin: 3.8 g/dL (ref 3.6–5.1)
Alkaline phosphatase (APISO): 57 U/L (ref 37–153)
BUN: 13 mg/dL (ref 7–25)
CO2: 23 mmol/L (ref 20–32)
Calcium: 9.1 mg/dL (ref 8.6–10.4)
Chloride: 97 mmol/L — ABNORMAL LOW (ref 98–110)
Creat: 0.68 mg/dL (ref 0.60–0.88)
GFR, Est African American: 94 mL/min/{1.73_m2} (ref >=60)
GFR, Est Non African American: 81 mL/min/{1.73_m2} (ref >=60)
Globulin: 2.9 g/dL (calc) (ref 1.9–3.7)
Glucose, Bld: 155 mg/dL — ABNORMAL HIGH (ref 65–99)
Potassium: 4.7 mmol/L (ref 3.5–5.3)
Sodium: 133 mmol/L — ABNORMAL LOW (ref 135–146)
Total Bilirubin: 0.5 mg/dL (ref 0.2–1.2)
Total Protein: 6.7 g/dL (ref 6.1–8.1)

## 2018-12-31 LAB — LIPID PANEL
Cholesterol: 118 mg/dL (ref <200)
HDL: 49 mg/dL — ABNORMAL LOW (ref >=50)
LDL Cholesterol (Calc): 50 mg/dL (calc)
Non-HDL Cholesterol (Calc): 69 mg/dL (calc) (ref <130)
Total CHOL/HDL Ratio: 2.4 (calc) (ref <5.0)
Triglycerides: 100 mg/dL (ref <150)

## 2018-12-31 LAB — HEMOGLOBIN A1C
Hgb A1c MFr Bld: 6.7 % of total Hgb — ABNORMAL HIGH (ref <5.7)
Mean Plasma Glucose: 146 (calc)
eAG (mmol/L): 8.1 (calc)

## 2018-12-31 LAB — TSH: TSH: 2.01 mIU/L (ref 0.40–4.50)

## 2019-01-03 DIAGNOSIS — R41841 Cognitive communication deficit: Secondary | ICD-10-CM | POA: Diagnosis not present

## 2019-01-03 DIAGNOSIS — R2689 Other abnormalities of gait and mobility: Secondary | ICD-10-CM | POA: Diagnosis not present

## 2019-01-03 DIAGNOSIS — M6281 Muscle weakness (generalized): Secondary | ICD-10-CM | POA: Diagnosis not present

## 2019-01-03 DIAGNOSIS — F0280 Dementia in other diseases classified elsewhere without behavioral disturbance: Secondary | ICD-10-CM | POA: Diagnosis not present

## 2019-01-04 DIAGNOSIS — R278 Other lack of coordination: Secondary | ICD-10-CM | POA: Diagnosis not present

## 2019-01-04 DIAGNOSIS — R2689 Other abnormalities of gait and mobility: Secondary | ICD-10-CM | POA: Diagnosis not present

## 2019-01-04 DIAGNOSIS — M6281 Muscle weakness (generalized): Secondary | ICD-10-CM | POA: Diagnosis not present

## 2019-01-04 DIAGNOSIS — R293 Abnormal posture: Secondary | ICD-10-CM | POA: Diagnosis not present

## 2019-01-05 DIAGNOSIS — M6281 Muscle weakness (generalized): Secondary | ICD-10-CM | POA: Diagnosis not present

## 2019-01-05 DIAGNOSIS — R293 Abnormal posture: Secondary | ICD-10-CM | POA: Diagnosis not present

## 2019-01-05 DIAGNOSIS — R278 Other lack of coordination: Secondary | ICD-10-CM | POA: Diagnosis not present

## 2019-01-06 DIAGNOSIS — M6281 Muscle weakness (generalized): Secondary | ICD-10-CM | POA: Diagnosis not present

## 2019-01-06 DIAGNOSIS — R278 Other lack of coordination: Secondary | ICD-10-CM | POA: Diagnosis not present

## 2019-01-06 DIAGNOSIS — R2689 Other abnormalities of gait and mobility: Secondary | ICD-10-CM | POA: Diagnosis not present

## 2019-01-06 DIAGNOSIS — R293 Abnormal posture: Secondary | ICD-10-CM | POA: Diagnosis not present

## 2019-01-07 DIAGNOSIS — R41841 Cognitive communication deficit: Secondary | ICD-10-CM | POA: Diagnosis not present

## 2019-01-07 DIAGNOSIS — F0280 Dementia in other diseases classified elsewhere without behavioral disturbance: Secondary | ICD-10-CM | POA: Diagnosis not present

## 2019-01-11 DIAGNOSIS — M6281 Muscle weakness (generalized): Secondary | ICD-10-CM | POA: Diagnosis not present

## 2019-01-11 DIAGNOSIS — R2689 Other abnormalities of gait and mobility: Secondary | ICD-10-CM | POA: Diagnosis not present

## 2019-01-12 DIAGNOSIS — M6281 Muscle weakness (generalized): Secondary | ICD-10-CM | POA: Diagnosis not present

## 2019-01-12 DIAGNOSIS — R293 Abnormal posture: Secondary | ICD-10-CM | POA: Diagnosis not present

## 2019-01-12 DIAGNOSIS — R278 Other lack of coordination: Secondary | ICD-10-CM | POA: Diagnosis not present

## 2019-01-12 DIAGNOSIS — R41841 Cognitive communication deficit: Secondary | ICD-10-CM | POA: Diagnosis not present

## 2019-01-12 DIAGNOSIS — F0280 Dementia in other diseases classified elsewhere without behavioral disturbance: Secondary | ICD-10-CM | POA: Diagnosis not present

## 2019-01-13 ENCOUNTER — Ambulatory Visit
Admission: RE | Admit: 2019-01-13 | Discharge: 2019-01-13 | Disposition: A | Discharge status: Home / Self Care | Payer: Medicare Other | Source: Ambulatory Visit | Attending: Family Medicine | Admitting: Family Medicine

## 2019-01-13 ENCOUNTER — Other Ambulatory Visit: Payer: Self-pay

## 2019-01-13 ENCOUNTER — Ambulatory Visit (INDEPENDENT_AMBULATORY_CARE_PROVIDER_SITE_OTHER): Payer: Medicare Other | Admitting: Family Medicine

## 2019-01-13 VITALS — BP 148/60 | HR 86 | Temp 98.2°F | Resp 18 | Ht 67.0 in | Wt 224.8 lb

## 2019-01-13 DIAGNOSIS — M25571 Pain in right ankle and joints of right foot: Secondary | ICD-10-CM

## 2019-01-13 DIAGNOSIS — M549 Dorsalgia, unspecified: Secondary | ICD-10-CM | POA: Diagnosis not present

## 2019-01-13 DIAGNOSIS — M7989 Other specified soft tissue disorders: Secondary | ICD-10-CM | POA: Diagnosis not present

## 2019-01-13 DIAGNOSIS — R2689 Other abnormalities of gait and mobility: Secondary | ICD-10-CM | POA: Diagnosis not present

## 2019-01-13 DIAGNOSIS — M6281 Muscle weakness (generalized): Secondary | ICD-10-CM | POA: Diagnosis not present

## 2019-01-13 DIAGNOSIS — M47816 Spondylosis without myelopathy or radiculopathy, lumbar region: Secondary | ICD-10-CM | POA: Diagnosis not present

## 2019-01-13 MED ORDER — CEPHALEXIN 500 MG PO CAPS: 500.0000 mg | ORAL_CAPSULE | Freq: Three times a day (TID) | ORAL | 0 refills | Status: DC

## 2019-01-13 NOTE — Progress Notes (Signed)
Subjective:    Patient ID: Bonnie Watkins, female    DOB: 1934/05/17, 84 y.o.   MRN: [id number]  HPI 03/08/18 Patient presents today with several concerns.  First over the last month, she has had episodes of fecal incontinence.  Occasionally when she stands up, stool simply come out of her rectum.  At times will be watery.  At other times will be hard stool pellets.  She takes MiraLAX intermittently due to constipation.  Some days she will go 3 days without a bowel movement and then she will take MiraLAX.  However whenever she takes MiraLAX she will have diarrhea.  This prevents her from taking it every day.  Rectal exam is performed today.  She has weak sphincter tone.  When I asked her to clamp down on my finger, I can barely feel her do anything.  Therefore there may be decreased rectal tone accounting for some of her fecal incontinence.  However there is no stool in the rectal vault.  I can appreciate no impaction or mass or blockage.  She denies any melena or hematochezia.  She also complains of pain in her right shoulder with abduction greater than 90 degrees.  She is have pain with internal and external rotation.  Pain is been present now for more than a month.  At that time, my plan was: Obtain an x-ray of her abdomen to evaluate for severe constipation.  If the patient has severe constipation this could possibly be causing some of her fecal incontinence and I would try to regulate her bowel movements more consistently with a combination of fiber and switching MiraLAX to low-dose Linzess 74 mcg daily.  If the x-ray shows no significant stool burden or impaction, I would focus our intention on trying to improve her rectal tone by having the patient perform Keagle exercises to strengthen the rectal tone.  Regarding the pain in her right shoulder, I suspect impingement syndrome.  I recommended a cortisone injection of the right shoulder.  She agrees.  Using sterile technique, I injected the  subacromial space with a mixture of 2 cc of lidocaine, 2 cc of Marcaine, and 2 cc of 40 mg/mL Kenalog.  Patient tolerated the procedure well without complication  12/24/46 SNF reports increasing confusion and increased urinary frequency.  Concerned she may have UTI. PMH is complicated by schizophrenia and Alzheimer's Dementia.   In talking to the daughter, this is been a gradual problem that is slowly gotten worse.  Patient is accusing people of stealing from her when she misplaces items at the nursing home.  Symptoms sound more consistent with worsening dementia and memory loss rather than delirium secondary to an infection.  Today she is pleasant, awake, alert and answering questions.  She smiles on exam.  She denies any cough.  She denies any chest pain.  She denies any shortness of breath.  She denies any otalgia or rhinorrhea or sinus pain or sore throat.  She denies any nausea vomiting or diarrhea.  She denies any dysuria urgency or frequency.  Urinalysis shows trace leukocyte esterase but is otherwise negative.  05/24/17 Daughter continues to report increased urinary frequency.  She is soaking through adult diapers daily.  She is constantly having to change them.  Patient states that she can feel the urge to urinate however before she can make it to the bathroom, she is already wet herself.  She denies any dysuria.  She denies any hesitancy.  She denies any hematuria.  Work-up  last in September revealed no complications from a metabolic standpoint that would cause polyuria.  Concern is that she may have overactive bladder.  However given her schizophrenia I am also concerned about adding medication for overactive bladder exacerbating memory loss and confusion.  Patient also reports pain and swelling in her right foot.  The pain is on the dorsum of the foot distal to the midfoot but prior to the MTP joints.  It is over the fourth and fifth metatarsals.  Daughter states that last week it was bruised and  swollen and tender to touch.  Today it is still tender to the touch on the dorsum of the foot.  She is able to wiggle her toes.  Pulses are normal.  Bruising has improved and faded.  She denies any falls or injuries.  Patient has also gained approximately 30 pounds since May.  Her thyroid has not been checked since May.  She is not exercising and she is eating more.  At that time, my plan was: I suspect that the patient has overactive bladder syndrome causing her urinary incontinence coupled with her dementia  I am concerned that the patient may have sprained her right foot but I cannot exclude a stress fracture  Patient has significant weight gain  Begin by checking her TSH to ensure that her thyroid dose is correct for the weight gain.  Also check management of her diabetes to ensure that her hemoglobin A1c is not out of control.  Because the patient is on insulin, she is checking her sugars to 3 times a day at her assisted living facility to prevent hypoglycemia.  She needs to continue to check her sugars 2-3 times a day.  Patient may benefit from Trulicity to help lose weight.  There is no evidence of congestive heart failure.  There is no pitting edema, pulmonary edema, or shortness of breath.  This seems to be obesity and body weight that is gained.  The foot pain, I will obtain an x-ray to rule out a stress fracture.  Likely will recommend hard soled shoes and elevation until the swelling has improved if it simply a sprain.  For the overactive bladder, we could try Vesicare 10 mg a day and reassess in 3 weeks.  Watch closely for worsening confusion  12/30/18 Patient is on Myrbetriq instead of Vesicare.  Patient fell 2 times since August 8.  One time she slid off her shower chair.  The second time she fell trying to get off the toilet.  Her daughter states that she is more unstable on her feet recently.  She is receiving physical therapy at the skilled nursing facility however they are here today to  evaluate the pain in her posterior left hip.  This is where she fell.  The pain is located over the ischial spine.  She denies any pain with hip flexion, internal rotation, external rotation.  There is no tenderness to palpation.  She is able to bear weight on the hip.  She does complain of pain in her right shoulder which is chronic.  She has pain with abduction greater than 90 degrees.  She also has pain with internal and external rotation of the right shoulder.  She is due for fasting lab work.  The last time I try to make medication changes including reducing Seroquel and the trilafon, the patient experienced significant hallucinations and confusion.  At that time, my plan was: Begin by obtaining an x-ray of the left hip.  I  do not think the patient suffered a fracture.  I think she suffered a contusion but I would get the x-ray just to be thorough.  I believe the pain in her right shoulder is likely a rotator cuff tear.  Using sterile technique, I injected the right subacromial space with 2 cc lidocaine, 2 cc of Marcaine, and 2 cc of 40 mg/mL Kenalog.  She tolerated this procedure well without complication.  The patient received her flu shot today.  I do believe the polypharmacy is contributing some to her balance issues particular her antipsychotic medication.  Today there is no cogwheel rigidity on exam.  She denies any muscle stiffness.  Therefore I am going to temporarily discontinue benztropine to see if reducing some of the anticholinergic effects may help improve her balance.  We will watch closely for rigidity and cogwheeling.  While the patient is here today I will also check her hemoglobin A1c and her thyroid.  01/13/19 1 week ago, the patient slipped off her shower chair in the shower due to so and landed directly on her bottom and struck her back against the shower chair.  She now is extremely tender to palpation over the L2 vertebrae.  There is no visible bruising or swelling.  She also fell 1  additional time in the last week.  She was trying to stand up to get out of her chair when her feet became entangled in her blanket.  This caused her to trip and lose her balance.  She landed and suffered a bruise over the right lateral malleolus, and also on the lateral right thigh.  She also suffered a small abrasion over the right patella.  The abrasion over the right patella is healing.  The bruising over the right lateral thigh is purple to brown and is healing.  However the swelling has worsened over the right lateral malleolus.  It is now extremely edematous red and very tender to the touch.  It is also hot to touch.  See the photo below.    Past Medical History:  Diagnosis Date  . ACE inhibitor-aggravated angioedema   . Dementia ([place])   . Depression   . Diabetes mellitus without complication ([place])   . Diverticulosis   . GERD (gastroesophageal reflux disease)   . Hypertension   . Osteoporosis   . Schizophrenia ([place])    No past surgical history on file. Current Outpatient Medications on File Prior to Visit  Medication Sig Dispense Refill  . alendronate (FOSAMAX) 70 MG tablet TAKE 1 TAB EACH WEEK 30 MIN PRIOR TO BREAKFAST WITH LARGE GLASS OF WATER. REMAIN UPRIGHT. 12 tablet 3  . amLODipine (NORVASC) 10 MG tablet Take 1 tablet (10 mg total) by mouth daily. 90 tablet 3  . aspirin 81 MG EC tablet TAKE (1) TABLET BY MOUTH ONCE DAILY. 90 tablet 3  . B-D INSULIN SYRINGE 29G X 1/2" 1 ML MISC USE AS DIRECTED. 100 each 0  . Calcium Citrate-Vitamin D (CITRACAL PETITES/VITAMIN D) 200-250 MG-UNIT TABS TAKE (2) TABLETS BY MOUTH TWICE DAILY. 120 each 5  . citalopram (CELEXA) 20 MG tablet Take 20 mg by mouth daily.    Marland Kitchen GAVILAX powder MIX 1 CAPFUL (17G) IN 8 OUNCES OF JUICE/WATER AND DRINK ONCE DAILY ASNEEDED FOR CONSTIPATION. 510 g 3  . LANTUS 100 UNIT/ML injection INJECT 10 UNITS SUBCUTANEOUSLY EACH MORNING. 10 mL 2  . levothyroxine (SYNTHROID, LEVOTHROID) 50 MCG tablet TAKE ONE TABLET BY MOUTH  ONCE DAILY. 30 tablet 5  . Rolan Lipa  72 MCG capsule TAKE (1) CAPSULE BY MOUTH DAILY BEFORE BREAKFAST. 30 capsule 5  . memantine (NAMENDA) 10 MG tablet Take 1 tablet (10 mg total) by mouth 2 (two) times daily. 60 tablet 2  . metFORMIN (GLUCOPHAGE) 1000 MG tablet TAKE (1) TABLET BY MOUTH TWICE DAILY. 60 tablet 3  . mirabegron ER (MYRBETRIQ) 25 MG TB24 tablet Take 1 tablet (25 mg total) by mouth daily. 90 tablet 3  . omeprazole (PRILOSEC) 40 MG capsule TAKE (1) CAPSULE BY MOUTH ONCE DAILY. 30 capsule 11  . perphenazine (TRILAFON) 2 MG tablet Take 5 mg by mouth every morning.     Vladimir Faster Glycol-Propyl Glycol (SYSTANE) 0.4-0.3 % SOLN Place 1 drop into both eyes 2 (two) times daily.    . Psyllium Fiber 0.52 g CAPS TAKE (1) CAPSULE BY MOUTH THREE TIMES A DAY WITH FULL 8OZ OF WATER. 90 capsule 5  . QUEtiapine (SEROQUEL) 100 MG tablet Take 1 tablet (100 mg total) by mouth at bedtime. 30 tablet 5  . traMADol (ULTRAM) 50 MG tablet TAKE (1) TABLET BY MOUTH TWICE DAILY. 60 tablet 2  . benztropine (COGENTIN) 1 MG tablet Take 1 mg by mouth daily.      No current facility-administered medications on file prior to visit.     Allergies  Allergen Reactions  . Shellfish Allergy Anaphylaxis    ALL SEAFOOD ALLERGY  . Ace Inhibitors Other (See Comments)    On MAR  . Angiotensin Receptor Blockers Other (See Comments)    ON MAR  . Lisinopril     Angioedema  . Strawberry Extract Hives  . Robaxin [Methocarbamol] Rash   Social History   Socioeconomic History  . Marital status: Divorced    Spouse name: Not on file  . Number of children: Not on file  . Years of education: Not on file  . Highest education level: Not on file  Occupational History  . Not on file  Social Needs  . Financial resource strain: Not on file  . Food insecurity    Worry: Not on file    Inability: Not on file  . Transportation needs    Medical: Not on file    Non-medical: Not on file  Tobacco Use  . Smoking status: Former  Research scientist (life sciences)  . Smokeless tobacco: Never Used  Substance and Sexual Activity  . Alcohol use: No  . Drug use: No  . Sexual activity: Never    Comment: lives at [facility].    Lifestyle  . Physical activity    Days per week: Not on file    Minutes per session: Not on file  . Stress: Not on file  Relationships  . Social Herbalist on phone: Not on file    Gets together: Not on file    Attends religious service: Not on file    Active member of club or organization: Not on file    Attends meetings of clubs or organizations: Not on file    Relationship status: Not on file  . Intimate partner violence    Fear of current or ex partner: Not on file    Emotionally abused: Not on file    Physically abused: Not on file    Forced sexual activity: Not on file  Other Topics Concern  . Not on file  Social History Narrative  . Not on file      Review of Systems  All other systems reviewed and are negative.  Objective:   Physical Exam Vitals signs reviewed.  Constitutional:      General: She is not in acute distress.    Appearance: She is well-developed. She is not diaphoretic.  HENT:     Head: Normocephalic and atraumatic.     Right Ear: External ear normal.     Left Ear: External ear normal.     Nose: Nose normal.     Mouth/Throat:     Pharynx: No oropharyngeal exudate.  Eyes:     General: No scleral icterus.       Right eye: No discharge.        Left eye: No discharge.     Conjunctiva/sclera: Conjunctivae normal.     Pupils: Pupils are equal, round, and reactive to light.  Neck:     Musculoskeletal: Neck supple.     Thyroid: No thyromegaly.     Vascular: No JVD.  Cardiovascular:     Rate and Rhythm: Normal rate and regular rhythm.     Heart sounds: Normal heart sounds. No murmur. No friction rub. No gallop.   Pulmonary:     Effort: Pulmonary effort is normal. No respiratory distress.     Breath sounds: Normal breath sounds. No stridor. No wheezing or  rales.  Abdominal:     General: Bowel sounds are normal. There is no distension.     Palpations: Abdomen is soft. There is no mass.     Tenderness: There is no abdominal tenderness. There is no guarding or rebound.  Musculoskeletal:     Right ankle: She exhibits decreased range of motion, deformity and laceration. Tenderness. Lateral malleolus tenderness found.     Lumbar back: She exhibits decreased range of motion, tenderness, bony tenderness and pain.       Back:     Right upper leg: She exhibits tenderness.  Lymphadenopathy:     Cervical: No cervical adenopathy.  Skin:    Findings: No erythema or rash.  Neurological:     Mental Status: She is alert and oriented to person, place, and time.     Cranial Nerves: No cranial nerve deficit.     Sensory: No sensory deficit.     Motor: No abnormal muscle tone.     Coordination: Coordination normal.           Assessment & Plan:  1. Pain in vertebral column Patient suffered a bruise and I believe has a contusion to her mid back.  However I will also obtain an x-ray to rule out a vertebral fracture - DG Lumbar Spine Complete; Future  2. Acute right ankle pain Given the warmth, the redness, the heat, and the pain out of proportion to exam, I believe the patient is developing secondary cellulitis due to the abrasion over her right lateral ankle.  I will treat that with Keflex 500 mg p.o. 3 times daily for 7 days.  I will also obtain an x-ray of the right ankle to rule out a fracture in the distal fibula.  Patient has just started physical therapy due to weakness in her legs which is contributing to her falls.  I have encouraged her to complete the physical therapy as I believe that deconditioning in her leg muscles is contributing to her frequent falls. - DG Ankle Complete Right; Future

## 2019-01-14 DIAGNOSIS — R293 Abnormal posture: Secondary | ICD-10-CM | POA: Diagnosis not present

## 2019-01-14 DIAGNOSIS — M6281 Muscle weakness (generalized): Secondary | ICD-10-CM | POA: Diagnosis not present

## 2019-01-14 DIAGNOSIS — R278 Other lack of coordination: Secondary | ICD-10-CM | POA: Diagnosis not present

## 2019-01-14 DIAGNOSIS — R2689 Other abnormalities of gait and mobility: Secondary | ICD-10-CM | POA: Diagnosis not present

## 2019-01-17 DIAGNOSIS — R278 Other lack of coordination: Secondary | ICD-10-CM | POA: Diagnosis not present

## 2019-01-17 DIAGNOSIS — R2689 Other abnormalities of gait and mobility: Secondary | ICD-10-CM | POA: Diagnosis not present

## 2019-01-17 DIAGNOSIS — M6281 Muscle weakness (generalized): Secondary | ICD-10-CM | POA: Diagnosis not present

## 2019-01-17 DIAGNOSIS — R293 Abnormal posture: Secondary | ICD-10-CM | POA: Diagnosis not present

## 2019-01-18 DIAGNOSIS — R2689 Other abnormalities of gait and mobility: Secondary | ICD-10-CM | POA: Diagnosis not present

## 2019-01-18 DIAGNOSIS — M6281 Muscle weakness (generalized): Secondary | ICD-10-CM | POA: Diagnosis not present

## 2019-01-19 DIAGNOSIS — R278 Other lack of coordination: Secondary | ICD-10-CM | POA: Diagnosis not present

## 2019-01-19 DIAGNOSIS — M6281 Muscle weakness (generalized): Secondary | ICD-10-CM | POA: Diagnosis not present

## 2019-01-19 DIAGNOSIS — R293 Abnormal posture: Secondary | ICD-10-CM | POA: Diagnosis not present

## 2019-01-20 DIAGNOSIS — R2689 Other abnormalities of gait and mobility: Secondary | ICD-10-CM | POA: Diagnosis not present

## 2019-01-20 DIAGNOSIS — M6281 Muscle weakness (generalized): Secondary | ICD-10-CM | POA: Diagnosis not present

## 2019-01-21 DIAGNOSIS — R278 Other lack of coordination: Secondary | ICD-10-CM | POA: Diagnosis not present

## 2019-01-21 DIAGNOSIS — R293 Abnormal posture: Secondary | ICD-10-CM | POA: Diagnosis not present

## 2019-01-21 DIAGNOSIS — M6281 Muscle weakness (generalized): Secondary | ICD-10-CM | POA: Diagnosis not present

## 2019-01-24 ENCOUNTER — Other Ambulatory Visit: Payer: Self-pay

## 2019-01-24 ENCOUNTER — Emergency Department (HOSPITAL_COMMUNITY)
Admission: EM | Admit: 2019-01-24 | Discharge: 2019-01-26 | Disposition: A | Discharge status: Home / Self Care | Payer: Medicare Other | Attending: Emergency Medicine | Admitting: Emergency Medicine

## 2019-01-24 ENCOUNTER — Encounter (HOSPITAL_COMMUNITY): Payer: Self-pay

## 2019-01-24 DIAGNOSIS — Z79899 Other long term (current) drug therapy: Secondary | ICD-10-CM | POA: Insufficient documentation

## 2019-01-24 DIAGNOSIS — Z794 Long term (current) use of insulin: Secondary | ICD-10-CM | POA: Insufficient documentation

## 2019-01-24 DIAGNOSIS — Z87891 Personal history of nicotine dependence: Secondary | ICD-10-CM | POA: Insufficient documentation

## 2019-01-24 DIAGNOSIS — F03918 Unspecified dementia, unspecified severity, with other behavioral disturbance: Secondary | ICD-10-CM

## 2019-01-24 DIAGNOSIS — R451 Restlessness and agitation: Secondary | ICD-10-CM | POA: Diagnosis present

## 2019-01-24 DIAGNOSIS — I10 Essential (primary) hypertension: Secondary | ICD-10-CM | POA: Diagnosis not present

## 2019-01-24 DIAGNOSIS — E871 Hypo-osmolality and hyponatremia: Secondary | ICD-10-CM | POA: Insufficient documentation

## 2019-01-24 DIAGNOSIS — F0391 Unspecified dementia with behavioral disturbance: Secondary | ICD-10-CM | POA: Insufficient documentation

## 2019-01-24 DIAGNOSIS — Z20828 Contact with and (suspected) exposure to other viral communicable diseases: Secondary | ICD-10-CM | POA: Diagnosis not present

## 2019-01-24 DIAGNOSIS — F209 Schizophrenia, unspecified: Secondary | ICD-10-CM | POA: Diagnosis present

## 2019-01-24 DIAGNOSIS — Z03818 Encounter for observation for suspected exposure to other biological agents ruled out: Secondary | ICD-10-CM | POA: Diagnosis not present

## 2019-01-24 DIAGNOSIS — M6281 Muscle weakness (generalized): Secondary | ICD-10-CM | POA: Diagnosis not present

## 2019-01-24 DIAGNOSIS — Z751 Person awaiting admission to adequate facility elsewhere: Secondary | ICD-10-CM | POA: Diagnosis not present

## 2019-01-24 DIAGNOSIS — Z7982 Long term (current) use of aspirin: Secondary | ICD-10-CM | POA: Diagnosis not present

## 2019-01-24 DIAGNOSIS — E119 Type 2 diabetes mellitus without complications: Secondary | ICD-10-CM | POA: Diagnosis not present

## 2019-01-24 DIAGNOSIS — R2689 Other abnormalities of gait and mobility: Secondary | ICD-10-CM | POA: Diagnosis not present

## 2019-01-24 LAB — CBC WITH DIFFERENTIAL/PLATELET
Abs Immature Granulocytes: 0.04 10*3/uL (ref 0.00–0.07)
Basophils Absolute: 0 10*3/uL (ref 0.0–0.1)
Basophils Relative: 0 %
Eosinophils Absolute: 0 10*3/uL (ref 0.0–0.5)
Eosinophils Relative: 0 %
HCT: 41.2 % (ref 36.0–46.0)
Hemoglobin: 13.6 g/dL (ref 12.0–15.0)
Immature Granulocytes: 0 %
Lymphocytes Relative: 25 %
Lymphs Abs: 2.5 10*3/uL (ref 0.7–4.0)
MCH: 30.2 pg (ref 26.0–34.0)
MCHC: 33 g/dL (ref 30.0–36.0)
MCV: 91.6 fL (ref 80.0–100.0)
Monocytes Absolute: 0.7 10*3/uL (ref 0.1–1.0)
Monocytes Relative: 7 %
Neutro Abs: 6.6 10*3/uL (ref 1.7–7.7)
Neutrophils Relative %: 68 %
Platelets: 263 10*3/uL (ref 150–400)
RBC: 4.5 MIL/uL (ref 3.87–5.11)
RDW: 13.3 % (ref 11.5–15.5)
WBC: 9.9 10*3/uL (ref 4.0–10.5)
nRBC: 0 % (ref 0.0–0.2)

## 2019-01-24 LAB — COMPREHENSIVE METABOLIC PANEL
ALT: 25 U/L (ref 0–44)
AST: 26 U/L (ref 15–41)
Albumin: 4.1 g/dL (ref 3.5–5.0)
Alkaline Phosphatase: 52 U/L (ref 38–126)
Anion gap: 9 (ref 5–15)
BUN: 17 mg/dL (ref 8–23)
CO2: 26 mmol/L (ref 22–32)
Calcium: 9.3 mg/dL (ref 8.9–10.3)
Chloride: 94 mmol/L — ABNORMAL LOW (ref 98–111)
Creatinine, Ser: 0.61 mg/dL (ref 0.44–1.00)
GFR calc Af Amer: >60 mL/min (ref >60)
GFR calc non Af Amer: >60 mL/min (ref >60)
Glucose, Bld: 168 mg/dL — ABNORMAL HIGH (ref 70–99)
Potassium: 3.5 mmol/L (ref 3.5–5.1)
Sodium: 129 mmol/L — ABNORMAL LOW (ref 135–145)
Total Bilirubin: 0.9 mg/dL (ref 0.3–1.2)
Total Protein: 8.2 g/dL — ABNORMAL HIGH (ref 6.5–8.1)

## 2019-01-24 LAB — ETHANOL: Alcohol, Ethyl (B): <10 mg/dL (ref <10)

## 2019-01-24 MED ORDER — PERPHENAZINE 4 MG PO TABS
5.0000 mg | ORAL_TABLET | Freq: Every day | ORAL | Status: DC
  Administered 2019-01-26: 5 mg via ORAL
  Filled 2019-01-24 (×2): qty 1

## 2019-01-24 MED ORDER — QUETIAPINE FUMARATE 100 MG PO TABS
100.0000 mg | ORAL_TABLET | Freq: Every day | ORAL | Status: DC
  Administered 2019-01-25 – 2019-01-26 (×3): 100 mg via ORAL
  Filled 2019-01-24 (×3): qty 1

## 2019-01-24 MED ORDER — MIRABEGRON ER 25 MG PO TB24
25.0000 mg | ORAL_TABLET | Freq: Every day | ORAL | Status: DC
  Administered 2019-01-26: 25 mg via ORAL
  Filled 2019-01-24 (×2): qty 1

## 2019-01-24 MED ORDER — AMLODIPINE BESYLATE 5 MG PO TABS
10.0000 mg | ORAL_TABLET | Freq: Every day | ORAL | Status: DC
  Administered 2019-01-25 – 2019-01-26 (×2): 10 mg via ORAL
  Filled 2019-01-24 (×2): qty 2

## 2019-01-24 MED ORDER — TRAMADOL HCL 50 MG PO TABS
50.0000 mg | ORAL_TABLET | Freq: Two times a day (BID) | ORAL | Status: DC | PRN
  Administered 2019-01-26: 50 mg via ORAL
  Filled 2019-01-24: qty 1

## 2019-01-24 MED ORDER — METFORMIN HCL 500 MG PO TABS
1000.0000 mg | ORAL_TABLET | Freq: Two times a day (BID) | ORAL | Status: DC
  Administered 2019-01-25 – 2019-01-26 (×4): 1000 mg via ORAL
  Filled 2019-01-24 (×4): qty 2

## 2019-01-24 MED ORDER — MEMANTINE HCL 10 MG PO TABS
10.0000 mg | ORAL_TABLET | Freq: Two times a day (BID) | ORAL | Status: DC
  Administered 2019-01-25 – 2019-01-26 (×3): 10 mg via ORAL
  Filled 2019-01-24 (×5): qty 1

## 2019-01-24 MED ORDER — LINACLOTIDE 72 MCG PO CAPS
72.0000 ug | ORAL_CAPSULE | Freq: Every day | ORAL | Status: DC
  Administered 2019-01-25 – 2019-01-26 (×2): 72 ug via ORAL
  Filled 2019-01-24 (×3): qty 1

## 2019-01-24 MED ORDER — BENZTROPINE MESYLATE 1 MG PO TABS
1.0000 mg | ORAL_TABLET | Freq: Every day | ORAL | Status: DC
  Administered 2019-01-25 – 2019-01-26 (×2): 1 mg via ORAL
  Filled 2019-01-24 (×2): qty 1

## 2019-01-24 MED ORDER — PANTOPRAZOLE SODIUM 40 MG PO TBEC
40.0000 mg | DELAYED_RELEASE_TABLET | Freq: Every day | ORAL | Status: DC
  Administered 2019-01-25 – 2019-01-26 (×2): 40 mg via ORAL
  Filled 2019-01-24 (×2): qty 1

## 2019-01-24 NOTE — ED Triage Notes (Signed)
IVC paperwork states, "Respondent has dementia, schizophrenia, and alzheimers. She does take medication at the facility she resides in but her doctor has changed her medication within the past 2 weeks. Medications are as follows: Seroquel, Namenda, insulin, Levotheroxine, and blood pressure meds. Family is not sure of the full list of medication. Respondent believes that she can talk to God. Respondent believes that her 82 year old room mate (cannot leave bed) is trying to kill her. Respondent is aggressive with staff at her assisted living facility and carries a nail file on her threatening staff." Pt is calm and cooperative in triage. [employer] at bedside.

## 2019-01-24 NOTE — ED Notes (Signed)
Pt refuses to get into scrubs . Pt offered option of hospital gown. Pt states "no I'm staying in my clothes because I do not wear pants. I am staying in what I have on. I do not wear that."

## 2019-01-24 NOTE — ED Provider Notes (Signed)
[facility] DEPT Provider Note   CSN: [id number] Arrival date & time: 01/24/19  1850     History   Chief Complaint Chief Complaint  Patient presents with  . IVC    HPI Bonnie Watkins is a 84 y.o. female.     HPI Patient presents to the emergency room for evaluation of aggressive behavior.  Patient is a resident of a nursing facility.  Patient has had a recent adjustment in her medications.  According to the IVC paperwork patient believes that she can talk to God.  She feels that her 27 year old roommate at her nursing so discharge to kill her.  Patient has been aggressive with staff, carries a nail file and has been threatening staff.  In the ED patient is currently calm and cooperative.  She denies having any suicidal or homicidal ideation.  She denies threatening anyone.  She is not really sure why she is here Past Medical History:  Diagnosis Date  . ACE inhibitor-aggravated angioedema   . Dementia ([place])   . Depression   . Diabetes mellitus without complication ([place])   . Diverticulosis   . GERD (gastroesophageal reflux disease)   . Hypertension   . Osteoporosis   . Schizophrenia [facility])     Patient Active Problem List   Diagnosis Date Noted  . Chest pain 09/15/2017  . Primary localized osteoarthritis of right knee 07/21/2017  . First degree AV block 11/23/2013  . Diabetes mellitus without complication ([place])   . Schizophrenia ([place])   . Hypertension   . Dementia ([place])   . Depression   . ACE inhibitor-aggravated angioedema   . Diverticulosis     History reviewed. No pertinent surgical history.   OB History   No obstetric history on file.      Home Medications    Prior to Admission medications   Medication Sig Start Date End Date Taking? Authorizing Provider  alendronate (FOSAMAX) 70 MG tablet TAKE 1 TAB EACH WEEK 30 MIN PRIOR TO BREAKFAST WITH LARGE GLASS OF WATER. REMAIN UPRIGHT. 10/19/17  Yes Susy Frizzle, MD   amLODipine (NORVASC) 10 MG tablet Take 1 tablet (10 mg total) by mouth daily. 10/01/17  Yes Susy Frizzle, MD  aspirin 81 MG EC tablet TAKE (1) TABLET BY MOUTH ONCE DAILY. 05/19/17  Yes Susy Frizzle, MD  Calcium Citrate-Vitamin D (CITRACAL PETITES/VITAMIN D) 200-250 MG-UNIT TABS TAKE (2) TABLETS BY MOUTH TWICE DAILY. 12/31/16  Yes Susy Frizzle, MD  citalopram (CELEXA) 20 MG tablet Take 20 mg by mouth daily.   Yes [provider]  LANTUS 100 UNIT/ML injection INJECT 10 UNITS SUBCUTANEOUSLY EACH MORNING. 05/10/18  Yes Pickard, Cammie Mcgee, MD  levothyroxine (SYNTHROID, LEVOTHROID) 50 MCG tablet TAKE ONE TABLET BY MOUTH ONCE DAILY. 11/19/16  Yes PickardCammie Mcgee, MD  LINZESS 72 MCG capsule TAKE (1) CAPSULE BY MOUTH DAILY BEFORE BREAKFAST. 04/19/18  Yes Susy Frizzle, MD  memantine (NAMENDA) 10 MG tablet Take 1 tablet (10 mg total) by mouth 2 (two) times daily. 09/21/12  Yes Susy Frizzle, MD  metFORMIN (GLUCOPHAGE) 1000 MG tablet TAKE (1) TABLET BY MOUTH TWICE DAILY. 11/18/16  Yes Susy Frizzle, MD  mirabegron ER (MYRBETRIQ) 25 MG TB24 tablet Take 1 tablet (25 mg total) by mouth daily. 05/25/18  Yes Susy Frizzle, MD  omeprazole (PRILOSEC) 40 MG capsule TAKE (1) CAPSULE BY MOUTH ONCE DAILY. 12/31/16  Yes Susy Frizzle, MD  perphenazine (TRILAFON) 2 MG tablet Take  5 mg by mouth every morning.    Yes [provider]  Psyllium Fiber 0.52 g CAPS TAKE (1) CAPSULE BY MOUTH THREE TIMES A DAY WITH FULL 8OZ OF WATER. 04/19/18  Yes Susy Frizzle, MD  QUEtiapine (SEROQUEL) 100 MG tablet Take 1 tablet (100 mg total) by mouth at bedtime. 12/07/15  Yes Susy Frizzle, MD  traMADol (ULTRAM) 50 MG tablet TAKE (1) TABLET BY MOUTH TWICE DAILY. 01/16/17  Yes Susy Frizzle, MD  B-D INSULIN SYRINGE 29G X 1/2" 1 ML MISC USE AS DIRECTED. 09/27/18   Susy Frizzle, MD  benztropine (COGENTIN) 1 MG tablet Take 1 mg by mouth daily.     [provider]  cephALEXin  (KEFLEX) 500 MG capsule Take 1 capsule (500 mg total) by mouth 3 (three) times daily. Patient not taking: Reported on 01/24/2019 01/13/19   Susy Frizzle, MD  GAVILAX powder MIX 1 CAPFUL (17G) IN 8 OUNCES OF JUICE/WATER AND DRINK ONCE DAILY ASNEEDED FOR CONSTIPATION. Patient not taking: Reported on 01/24/2019 06/03/17   Susy Frizzle, MD    Family History Family History  Problem Relation Age of Onset  . Diabetes Mother   . Hypertension Father     Social History Social History   Tobacco Use  . Smoking status: Former Research scientist (life sciences)  . Smokeless tobacco: Never Used  Substance Use Topics  . Alcohol use: No  . Drug use: No     Allergies   Shellfish allergy, Ace inhibitors, Angiotensin receptor blockers, Lisinopril, Strawberry extract, and Robaxin [methocarbamol]   Review of Systems Review of Systems  All other systems reviewed and are negative.    Physical Exam Updated Vital Signs BP (!) 156/72 (BP Location: Right Arm)   Pulse 90   Temp 98.3 F (36.8 C) (Oral)   Resp 16   SpO2 97%   Physical Exam Vitals signs and nursing note reviewed.  Constitutional:      General: She is not in acute distress.    Appearance: She is well-developed.  HENT:     Head: Normocephalic and atraumatic.     Right Ear: External ear normal.     Left Ear: External ear normal.  Eyes:     General: No scleral icterus.       Right eye: No discharge.        Left eye: No discharge.     Conjunctiva/sclera: Conjunctivae normal.  Neck:     Musculoskeletal: Neck supple.     Trachea: No tracheal deviation.  Cardiovascular:     Rate and Rhythm: Normal rate and regular rhythm.  Pulmonary:     Effort: Pulmonary effort is normal. No respiratory distress.     Breath sounds: Normal breath sounds. No stridor. No wheezing or rales.  Abdominal:     General: Bowel sounds are normal. There is no distension.     Palpations: Abdomen is soft.     Tenderness: There is no abdominal tenderness. There is no  guarding or rebound.  Musculoskeletal:        General: No tenderness.  Skin:    General: Skin is warm and dry.     Findings: No rash.  Neurological:     Mental Status: She is alert.     Cranial Nerves: No cranial nerve deficit (no facial droop, extraocular movements intact, no slurred speech).     Sensory: No sensory deficit.     Motor: No abnormal muscle tone or seizure activity.     Coordination: Coordination  normal.  Psychiatric:        Mood and Affect: Mood normal.        Behavior: Behavior normal.      ED Treatments / Results  Labs (all labs ordered are listed, but only abnormal results are displayed) Labs Reviewed  COMPREHENSIVE METABOLIC PANEL - Abnormal; Notable for the following components:      Result Value   Sodium 129 (*)    Chloride 94 (*)    Glucose, Bld 168 (*)    Total Protein 8.2 (*)    All other components within normal limits  ETHANOL  CBC WITH DIFFERENTIAL/PLATELET  RAPID URINE DRUG SCREEN, HOSP PERFORMED  URINALYSIS, ROUTINE W REFLEX MICROSCOPIC     Procedures Procedures (including critical care time)  Medications Ordered in ED Medications - No data to display   Initial Impression / Assessment and Plan / ED Course  I have reviewed the triage vital signs and the nursing notes.  Pertinent labs & imaging results that were available during my care of the patient were reviewed by me and considered in my medical decision making (see chart for details).  Clinical Course as of Jan 24 2319  Mon Jan 24, 2019  2318 Sodium level low at 129 but similar to previous values.   [id number] Labs otherwise unremarkable.   [JK]    Clinical Course User Index [JK] Dorie Rank, MD     Patient presented for evaluation of aggressive behavior.  Patient remained calm in the ED.  No signs of any acute medical issues.  Psychiatry consulted.  Dispel pending.  Final Clinical Impressions(s) / ED Diagnoses   Final diagnoses:  Dementia with aggressive behavior [facility])   Chronic hyponatremia    ED Discharge Orders    None       Dorie Rank, MD 01/24/19 2320

## 2019-01-25 ENCOUNTER — Encounter (HOSPITAL_COMMUNITY): Payer: Self-pay | Admitting: Clinical

## 2019-01-25 ENCOUNTER — Emergency Department (HOSPITAL_COMMUNITY): Payer: Medicare Other

## 2019-01-25 DIAGNOSIS — Z751 Person awaiting admission to adequate facility elsewhere: Secondary | ICD-10-CM | POA: Diagnosis not present

## 2019-01-25 DIAGNOSIS — F209 Schizophrenia, unspecified: Secondary | ICD-10-CM | POA: Diagnosis not present

## 2019-01-25 LAB — URINALYSIS, ROUTINE W REFLEX MICROSCOPIC
Bilirubin Urine: NEGATIVE
Glucose, UA: NEGATIVE mg/dL
Hgb urine dipstick: NEGATIVE
Ketones, ur: 5 mg/dL — AB
Leukocytes,Ua: NEGATIVE
Nitrite: NEGATIVE
Protein, ur: NEGATIVE mg/dL
Specific Gravity, Urine: 1.008 (ref 1.005–1.030)
pH: 7 (ref 5.0–8.0)

## 2019-01-25 LAB — RAPID URINE DRUG SCREEN, HOSP PERFORMED
Amphetamines: NOT DETECTED
Barbiturates: NOT DETECTED
Benzodiazepines: NOT DETECTED
Cocaine: NOT DETECTED
Opiates: NOT DETECTED
Tetrahydrocannabinol: NOT DETECTED

## 2019-01-25 LAB — CBG MONITORING, ED: Glucose-Capillary: 198 mg/dL — ABNORMAL HIGH (ref 70–99)

## 2019-01-25 LAB — SARS CORONAVIRUS 2 (TAT 6-24 HRS): SARS Coronavirus 2: NEGATIVE

## 2019-01-25 NOTE — ED Notes (Signed)
Daughter, Delcie Roch [phone] asked to be called for update.

## 2019-01-25 NOTE — BH Assessment (Signed)
Tele Assessment Note   Patient Name: Bonnie Watkins MRN: [id number] Referring Physician: Tomi Bamberger Location of Patient: [facility] Location of Provider: [place] is an 84 y.o. female presenting to [facility] ED via [employer] from her assisted living under IVC. Per EDP: "Patient presents to the emergency room for evaluation of aggressive behavior.  Patient is a resident of a nursing facility.  Patient has had a recent adjustment in her medications.  According to the IVC paperwork patient believes that she can talk to God.  She feels that her 58 year old roommate at her nursing so discharge to kill her.  Patient has been aggressive with staff, carries a nail file and has been threatening staff.  In the ED patient is currently calm and cooperative.  She denies having any suicidal or homicidal ideation.  She denies threatening anyone.  She is not really sure why she is here."  Upon this clinician's exam patient is calm and cooperative, however she renders limited history due to AMS. Patient states that she came to the hospital with the police but she is not sure why. She denies Si/HI/AVH. She denies any history of mental illness. She does endorse feeling paranoid that individuals in her assisted living are trying to kill her. She denies any substance use, trauma history, or criminal charges.  Collateral information was obtained from patient's daughters, Delcie Roch and Karalee Height [phone]. Patient has a long history of mental illness including schizophrenia, depression, and dementia. Patient has not had a psychotic episode in 14 years until now. She believes her roommate is trying to kill her and walks around with a nail file to defend herself. 1 year ago her doctor took her off some of her medications as it was causing her weakness and she was falling frequently. Since that time patient has decompesated. Patient is now back on all of her medications but is not back to  the same level of functioning. For 2 months patient has "been making weird comments" about people trying to kill her.  Diagnosis: Schizophrenia   Dementia  Past Medical History:  Past Medical History:  Diagnosis Date  . ACE inhibitor-aggravated angioedema   . Dementia ([place])   . Depression   . Diabetes mellitus without complication ([place])   . Diverticulosis   . GERD (gastroesophageal reflux disease)   . Hypertension   . Osteoporosis   . Schizophrenia ([place])     History reviewed. No pertinent surgical history.  Family History:  Family History  Problem Relation Age of Onset  . Diabetes Mother   . Hypertension Father     Social History:  reports that she has quit smoking. She has never used smokeless tobacco. She reports that she does not drink alcohol or use drugs.  Additional Social History:  Alcohol / Drug Use Pain Medications: see MAR Prescriptions: see MAR Over the Counter: see MAR History of alcohol / drug use?: No history of alcohol / drug abuse  CIWA: CIWA-Ar BP: 134/69 Pulse Rate: 72 COWS:    Allergies:  Allergies  Allergen Reactions  . Shellfish Allergy Anaphylaxis    ALL SEAFOOD ALLERGY  . Ace Inhibitors Other (See Comments)    On MAR  . Angiotensin Receptor Blockers Other (See Comments)    ON MAR  . Lisinopril     Angioedema  . Strawberry Extract Hives  . Robaxin [Methocarbamol] Rash    Home Medications: (Not in a hospital admission)   OB/GYN Status:  No LMP  recorded. Patient has had a hysterectomy.  General Assessment Data Location of Assessment: [facility] TTS Assessment: In system Is this a Tele or Face-to-Face Assessment?: Tele Assessment Is this an Initial Assessment or a Re-assessment for this encounter?: Initial Assessment Patient Accompanied by:: N/A Language Other than English: No Living Arrangements: In Assisted Living/Nursing Home (Comment: Name of Nursing Home([facility]) What gender do you identify as?: Female Marital status:  Widowed [place] name: Rudene Re Pregnancy Status: No Living Arrangements: Group Home Can pt return to current living arrangement?: Yes Admission Status: Involuntary Petitioner: Family member Is patient capable of signing voluntary admission?: No Referral Source: Self/Family/Friend Insurance type: Medicare     Crisis Care Plan Living Arrangements: Group Home Legal Guardian: (self) Name of Psychiatrist: [facility] Name of Therapist: none  Education Status Is patient currently in school?: No Is the patient employed, unemployed or receiving disability?: Receiving disability income  Risk to self with the past 6 months Suicidal Ideation: No Has patient been a risk to self within the past 6 months prior to admission? : No Suicidal Intent: No Has patient had any suicidal intent within the past 6 months prior to admission? : No Is patient at risk for suicide?: No Suicidal Plan?: No Has patient had any suicidal plan within the past 6 months prior to admission? : No Access to Means: No What has been your use of drugs/alcohol within the last 12 months?: none Previous Attempts/Gestures: No How many times?: (0) Other Self Harm Risks: none Triggers for Past Attempts: None known Intentional Self Injurious Behavior: None Family Suicide History: No Recent stressful life event(s): (none known) Persecutory voices/beliefs?: No Depression: No Depression Symptoms: Feeling angry/irritable Substance abuse history and/or treatment for substance abuse?: No Suicide prevention information given to non-admitted patients: Not applicable  Risk to Others within the past 6 months Homicidal Ideation: No Does patient have any lifetime risk of violence toward others beyond the six months prior to admission? : Yes (comment)(aggression in assisted living) Thoughts of Harm to Others: No-Not Currently Present/Within Last 6 Months Current Homicidal Intent: No Current Homicidal Plan: No Access to  Homicidal Means: No Identified Victim: none History of harm to others?: No Assessment of Violence: None Noted Violent Behavior Description: none noted Does patient have access to weapons?: Yes (Comment)(using a nail file as a knife) Criminal Charges Pending?: No Does patient have a court date: No Is patient on probation?: No  Psychosis Hallucinations: None noted Delusions: Persecutory, Unspecified  Mental Status Report Appearance/Hygiene: In scrubs Eye Contact: Good Motor Activity: Freedom of movement Speech: Logical/coherent Level of Consciousness: Alert Mood: Pleasant Affect: Appropriate to circumstance Anxiety Level: Minimal Thought Processes: Coherent, Relevant Judgement: Partial Orientation: Person, Place, Time, Situation Obsessive Compulsive Thoughts/Behaviors: None  Cognitive Functioning Concentration: Normal Memory: Recent Intact, Remote Impaired Is patient IDD: No Insight: Fair Impulse Control: Poor Appetite: Good Have you had any weight changes? : No Change Sleep: Unable to Assess Total Hours of Sleep: (UTA) Vegetative Symptoms: None  ADLScreening [facility] Assessment Services) Patient's cognitive ability adequate to safely complete daily activities?: No Patient able to express need for assistance with ADLs?: Yes Independently performs ADLs?: Yes (appropriate for developmental age)  Prior Inpatient Therapy Prior Inpatient Therapy: Yes Prior Therapy Dates: 2007 Prior Therapy Facilty/Provider(s): in [place] Reason for Treatment: psychosis  Prior Outpatient Therapy Prior Outpatient Therapy: Yes Prior Therapy Dates: ongoing Prior Therapy Facilty/Provider(s): [facility] Reason for Treatment: medication management Does patient have an ACCT team?: No Does patient have Intensive In-House Services?  : No  Does patient have [facility] services? : No Does patient have P4CC services?: No  ADL Screening (condition at time of admission) Patient's cognitive ability  adequate to safely complete daily activities?: No Is the patient deaf or have difficulty hearing?: No Does the patient have difficulty seeing, even when wearing glasses/contacts?: No Does the patient have difficulty concentrating, remembering, or making decisions?: Yes Patient able to express need for assistance with ADLs?: Yes Does the patient have difficulty dressing or bathing?: No Independently performs ADLs?: Yes (appropriate for developmental age) Does the patient have difficulty walking or climbing stairs?: No Weakness of Legs: None Weakness of Arms/Hands: None  Home Assistive Devices/Equipment Home Assistive Devices/Equipment: None  Therapy Consults (therapy consults require a physician order) PT Evaluation Needed: No OT Evalulation Needed: No SLP Evaluation Needed: No Abuse/Neglect Assessment (Assessment to be complete while patient is alone) Abuse/Neglect Assessment Can Be Completed: Yes Physical Abuse: Denies Verbal Abuse: Denies Sexual Abuse: Denies Exploitation of patient/patient's resources: Denies Self-Neglect: Denies Values / Beliefs Cultural Requests During Hospitalization: None Spiritual Requests During Hospitalization: None Consults Spiritual Care Consult Needed: No Social Work Consult Needed: No Regulatory affairs officer (For Healthcare) Does Patient Have a Medical Advance Directive?: No Would patient like information on creating a medical advance directive?: No - Patient declined          Disposition: Shuvon Rankin, NP recommends gero-psych. TTS to seek placement. Disposition Initial Assessment Completed for this Encounter: Yes  This service was provided via telemedicine using a 2-way, interactive audio and video technology.  Names of all persons participating in this telemedicine service and their role in this encounter. Name: Shawonda Saunder Role: patient  Name: Avel Sensor Role: patient's daughter  Name: Karalee Height Role: patient's daughter   Name: Orvis Brill, LCSW Role: TTS    Orvis Brill 01/25/2019 9:22 AM

## 2019-01-25 NOTE — BH Assessment (Addendum)
[facility] The Assessment Progress Note  Per Buford Dresser, DO, this pt requires psychiatric hospitalization at this time.  Pt presents under IVC initiated by pt's daughter, and upheld by Lind Covert, LCSW.  The following facilities have been contacted to seek placement for this pt, with results as noted:  Beds available, information sent, decision pending:  [place]:  [facility], [place] Coordinator [phone]

## 2019-01-25 NOTE — ED Notes (Signed)
Pt resting peacefully. No signs of distress.

## 2019-01-25 NOTE — ED Notes (Signed)
Pt changed into gown. Belongings placed in cabinet labeled Aileen Fass

## 2019-01-25 NOTE — ED Notes (Signed)
Spoke with daughter Neomi at this time.

## 2019-01-25 NOTE — BHH Counselor (Signed)
Disposition: Shuvon Rankin, NP recommends gero-psych. TTS to seek placement.

## 2019-01-25 NOTE — ED Notes (Signed)
EKG given to Dr. Knapp. 

## 2019-01-26 ENCOUNTER — Encounter (HOSPITAL_COMMUNITY): Payer: Self-pay

## 2019-01-26 DIAGNOSIS — R0902 Hypoxemia: Secondary | ICD-10-CM | POA: Diagnosis not present

## 2019-01-26 DIAGNOSIS — M2550 Pain in unspecified joint: Secondary | ICD-10-CM | POA: Diagnosis not present

## 2019-01-26 DIAGNOSIS — F209 Schizophrenia, unspecified: Secondary | ICD-10-CM

## 2019-01-26 DIAGNOSIS — F0390 Unspecified dementia without behavioral disturbance: Secondary | ICD-10-CM

## 2019-01-26 DIAGNOSIS — Z7401 Bed confinement status: Secondary | ICD-10-CM | POA: Diagnosis not present

## 2019-01-26 MED ORDER — PERPHENAZINE 4 MG PO TABS: 5.0000 mg | ORAL_TABLET | Freq: Every day | ORAL | 0 refills | Status: DC

## 2019-01-26 NOTE — Discharge Summary (Deleted)
  Patient to be transferred to [facility] for inpatient psychiatric treatment 

## 2019-01-26 NOTE — ED Notes (Signed)
[facility] called for transport.  

## 2019-01-26 NOTE — ED Notes (Signed)
Spoke with Education officer, museum, informs nurse that she is waiting to hear back from pt daughter r/t transfer back to facility

## 2019-01-26 NOTE — Consult Note (Addendum)
[facility] Psych ED Discharge  01/26/2019 12:46 PM Bonnie Watkins  MRN:  [id number] Principal Problem: Schizophrenia [facility]) Discharge Diagnoses: Principal Problem:   Schizophrenia [facility])  Per TTS Assessment Note: Bonnie Watkins is an 84 y.o. female presenting to [facility] ED via [employer] from her assisted living under IVC. Per EDP: "Patient presents to the emergency room for evaluation of aggressive behavior. Patient is a resident of a nursing facility. Patient has had a recent adjustment in her medications. According to the IVC paperwork patient believes that she can talk to God. She feels that her 74 year old roommate at her nursing so discharge to kill her. Patient has been aggressive with staff, carries a nail file and has been threatening staff. In the ED patient is currently calm and cooperative. She denies having any suicidal or homicidal ideation. She denies threatening anyone. She is not really sure why she is here." Upon this clinician's exam patient is calm and cooperative, however she renders limited history due to AMS. Patient states that she came to the hospital with the police but she is not sure why. She denies Si/HI/AVH. She denies any history of mental illness. She does endorse feeling paranoid that individuals in her assisted living are trying to kill her. She denies any substance use, trauma history, or criminal charges. Collateral information was obtained from patient's daughters, Bonnie Watkins and Bonnie Watkins [phone]. Patient has a long history of mental illness including schizophrenia, depression, and dementia. Patient has not had a psychotic episode in 14 years until now. She believes her roommate is trying to kill her and walks around with a nail file to defend herself. 1 year ago her doctor took her off some of her medications as it was causing her weakness and she was falling frequently. Since that time patient has decompesated. Patient is now back on all of her  medications but is not back to the same level of functioning. For 2 months patient has "been making weird comments" about people trying to kill her.   Today's Assessment 01/26/19  Subjective: Bonnie Watkins, 84 y.o., female patient seen via tele psych by this provider, Dr. Mariea Clonts; and chart reviewed on 01/26/19.  On evaluation Bonnie Watkins reports she does not know why she was brought to the hospital.  States that she feels fine.  Patient reports she is eating/sleeping without difficulty and has tolerated her medications without adverse reaction.  Patient was able to give correct age, date of birth, year, month, and current place, and the name of the nursing facility that she currently lives.  States that she is able to bathe herself and perform most ADL without assistance.  Patient asked if she was having problem with the staff at nursing home and she responded "I thought they was doing something to me and I got upset about that; but I don't know what they was doing."  Patient asked if she had tried to harm or threaten someone with finger nail file; and patient responded "No, I have a finger nail file to clean my nails with; not to hurt anyone."  Patient denies suicidal/self-harm/homicidal ideation, psychosis, and paranoia.  Patient states that she has been living in the same nursing facility for 10 yrs.  States that she has daughters and sons but was unable to tell how many.   During evaluation Bonnie Watkins is alert/oriented x 4; calm, pleasant, and cooperative throughout assessment.  Her mood is congruent with affect.  She does not  appear to be responding to internal/external stimuli or delusional thoughts.  Patient denies suicidal/self-harm/homicidal ideation, psychosis, and paranoia.  There has been no behavioral incidents since patient admission to ED  Patient answered question appropriately.  Patient psychiatrically cleared to return to nursing facility and follow up  with primary psychiatric provider. Continue current psychotropic medications    Total Time spent with patient: 30 minutes  Past Psychiatric History: Schizophrenia   Past Medical History:  Past Medical History:  Diagnosis Date  . ACE inhibitor-aggravated angioedema   . Dementia ([place])   . Depression   . Diabetes mellitus without complication ([place])   . Diverticulosis   . GERD (gastroesophageal reflux disease)   . Hypertension   . Osteoporosis   . Schizophrenia ([place])    History reviewed. No pertinent surgical history. Family History:  Family History  Problem Relation Age of Onset  . Diabetes Mother   . Hypertension Father    Family Psychiatric  History: None per chart review.  Social History:  Social History   Substance and Sexual Activity  Alcohol Use No     Social History   Substance and Sexual Activity  Drug Use No    Social History   Socioeconomic History  . Marital status: Divorced    Spouse name: Not on file  . Number of children: Not on file  . Years of education: Not on file  . Highest education level: Not on file  Occupational History  . Not on file  Social Needs  . Financial resource strain: Not on file  . Food insecurity    Worry: Not on file    Inability: Not on file  . Transportation needs    Medical: Not on file    Non-medical: Not on file  Tobacco Use  . Smoking status: Former Research scientist (life sciences)  . Smokeless tobacco: Never Used  Substance and Sexual Activity  . Alcohol use: No  . Drug use: No  . Sexual activity: Never    Comment: lives at [facility].    Lifestyle  . Physical activity    Days per week: Not on file    Minutes per session: Not on file  . Stress: Not on file  Relationships  . Social Herbalist on phone: Not on file    Gets together: Not on file    Attends religious service: Not on file    Active member of club or organization: Not on file    Attends meetings of clubs or organizations: Not on file    Relationship  status: Not on file  Other Topics Concern  . Not on file  Social History Narrative  . Not on file    Has this patient used any form of tobacco in the last 30 days? (Cigarettes, Smokeless Tobacco, Cigars, and/or Pipes) Prescription not provided because: Patient doesn't use tobacco products  Current Medications: Current Facility-Administered Medications  Medication Dose Route Frequency Provider Last Rate Last Dose  . amLODipine (NORVASC) tablet 10 mg  10 mg Oral Daily Dorie Rank, MD   10 mg at 01/26/19 0941  . benztropine (COGENTIN) tablet 1 mg  1 mg Oral Daily Dorie Rank, MD   1 mg at 01/26/19 0940  . linaclotide (LINZESS) capsule 72 mcg  72 mcg Oral QAC breakfast Dorie Rank, MD   72 mcg at 01/26/19 0941  . memantine (NAMENDA) tablet 10 mg  10 mg Oral BID Dorie Rank, MD   10 mg at 01/26/19 0942  . metFORMIN (  GLUCOPHAGE) tablet 1,000 mg  1,000 mg Oral BID WC Dorie Rank, MD   1,000 mg at 01/26/19 0941  . mirabegron ER (MYRBETRIQ) tablet 25 mg  25 mg Oral Daily Dorie Rank, MD   25 mg at 01/26/19 0941  . pantoprazole (PROTONIX) EC tablet 40 mg  40 mg Oral Daily Dorie Rank, MD   40 mg at 01/26/19 0941  . perphenazine (TRILAFON) tablet 5 mg  5 mg Oral Daily Dorie Rank, MD   5 mg at 01/26/19 0942  . QUEtiapine (SEROQUEL) tablet 100 mg  100 mg Oral QHS Dorie Rank, MD   100 mg at 01/25/19 2228  . traMADol (ULTRAM) tablet 50 mg  50 mg Oral Q12H PRN Dorie Rank, MD       Current Outpatient Medications  Medication Sig Dispense Refill  . alendronate (FOSAMAX) 70 MG tablet TAKE 1 TAB EACH WEEK 30 MIN PRIOR TO BREAKFAST WITH LARGE GLASS OF WATER. REMAIN UPRIGHT. 12 tablet 3  . amLODipine (NORVASC) 10 MG tablet Take 1 tablet (10 mg total) by mouth daily. 90 tablet 3  . aspirin 81 MG EC tablet TAKE (1) TABLET BY MOUTH ONCE DAILY. 90 tablet 3  . Calcium Citrate-Vitamin D (CITRACAL PETITES/VITAMIN D) 200-250 MG-UNIT TABS TAKE (2) TABLETS BY MOUTH TWICE DAILY. 120 each 5  . citalopram (CELEXA) 20 MG tablet  Take 20 mg by mouth daily.    Marland Kitchen LANTUS 100 UNIT/ML injection INJECT 10 UNITS SUBCUTANEOUSLY EACH MORNING. 10 mL 2  . levothyroxine (SYNTHROID, LEVOTHROID) 50 MCG tablet TAKE ONE TABLET BY MOUTH ONCE DAILY. 30 tablet 5  . LINZESS 72 MCG capsule TAKE (1) CAPSULE BY MOUTH DAILY BEFORE BREAKFAST. 30 capsule 5  . memantine (NAMENDA) 10 MG tablet Take 1 tablet (10 mg total) by mouth 2 (two) times daily. 60 tablet 2  . metFORMIN (GLUCOPHAGE) 1000 MG tablet TAKE (1) TABLET BY MOUTH TWICE DAILY. 60 tablet 3  . mirabegron ER (MYRBETRIQ) 25 MG TB24 tablet Take 1 tablet (25 mg total) by mouth daily. 90 tablet 3  . omeprazole (PRILOSEC) 40 MG capsule TAKE (1) CAPSULE BY MOUTH ONCE DAILY. 30 capsule 11  . Psyllium Fiber 0.52 g CAPS TAKE (1) CAPSULE BY MOUTH THREE TIMES A DAY WITH FULL 8OZ OF WATER. 90 capsule 5  . QUEtiapine (SEROQUEL) 100 MG tablet Take 1 tablet (100 mg total) by mouth at bedtime. 30 tablet 5  . traMADol (ULTRAM) 50 MG tablet TAKE (1) TABLET BY MOUTH TWICE DAILY. 60 tablet 2  . B-D INSULIN SYRINGE 29G X 1/2" 1 ML MISC USE AS DIRECTED. 100 each 0  . benztropine (COGENTIN) 1 MG tablet Take 1 mg by mouth daily.     Marland Kitchen perphenazine (TRILAFON) 4 MG tablet Take 1.5 tablets (6 mg total) by mouth daily. 15 tablet 0   PTA Medications: (Not in a hospital admission)   Musculoskeletal: Strength & Muscle Tone: Patient moved upper lower ext bilateral while in bed Gait & Station: Did not see patient ambulate Patient leans: N/A  Psychiatric Specialty Exam: Physical Exam  Nursing note and vitals reviewed. Constitutional: She is oriented to person, place, and time. She appears well-nourished. No distress.  Respiratory: Effort normal.  Neurological: She is alert and oriented to person, place, and time.  Psychiatric: She has a normal mood and affect. Her speech is normal and behavior is normal. Judgment and thought content normal. Thought content is not paranoid. Cognition and memory are normal. She  expresses no homicidal and no suicidal ideation.  Review of Systems  Psychiatric/Behavioral: Hallucinations: Denies. Memory loss: History of dementia.   Substance abuse: Denies. Suicidal ideas: Stable. Nervous/anxious: Denies. Insomnia: Denies.   All other systems reviewed and are negative.   Blood pressure 128/60, pulse 85, temperature 98.9 F (37.2 C), temperature source Oral, resp. rate 18, SpO2 93 %.There is no Watkins or weight on file to calculate BMI.  General Appearance: Casual and hospital gown  Eye Contact:  Good  Speech:  Clear and Coherent and Normal Rate  Volume:  Normal  Mood:  "Good" Appropriate  Affect:  Appropriate and Congruent  Thought Process:  Coherent  Orientation:  Full (Time, Place, and Person)  Thought Content:  WDL  Suicidal Thoughts:  No  Homicidal Thoughts:  No  Memory:  Immediate;   Fair Recent;   Fair Remote;   Fair  Judgement:  Fair  Insight:  Present  Psychomotor Activity:  Normal  Concentration:  Concentration: Fair and Attention Span: Fair  Recall:  [employer] of Knowledge:  Fair  Language:  Good  Akathisia:  No  Handed:  Right  AIMS (if indicated):   N/A  Assets:  Communication Skills Desire for Improvement Housing Social Support  ADL's:  Intact  Cognition:  WNL  Sleep:   N/A     Demographic Factors:  Age 4 or older and Caucasian  Loss Factors: NA  Historical Factors: NA  Risk Reduction Factors:   Religious beliefs about death, Living with another person, especially a relative and Positive social support  Continued Clinical Symptoms:  Previous Psychiatric Diagnoses and Treatments  Cognitive Features That Contribute To Risk:  None    Suicide Risk:  Minimal: No identifiable suicidal ideation.  Patients presenting with no risk factors but with morbid ruminations; may be classified as minimal risk based on the severity of the depressive symptoms    Plan Of Care/Follow-up recommendations:  Activity:  As tolerated Diet:   Heart healthy Other:  Follow up with primary psychiatric provider  Disposition: No evidence of imminent risk to self or others at present.   Patient does not meet criteria for psychiatric inpatient admission. Supportive therapy provided about ongoing stressors. Discussed crisis plan, support from social network, calling 911, coming to the Emergency Department, and calling Suicide Hotline.   Shuvon Rankin, NP 01/26/2019, 12:46 PM    Patient seen by telemedicine for psychiatric evaluation, chart reviewed and case discussed with the physician extender and developed treatment plan. Reviewed the information documented and agree with the treatment plan.  Buford Dresser, DO 01/26/19 4:58 PM

## 2019-01-26 NOTE — NC FL2 (Signed)
[place] LEVEL OF CARE SCREENING TOOL     IDENTIFICATION  Patient Name: Bonnie Watkins Birthdate: 25-Jun-1934 Sex: female Admission Date (Current Location): 01/24/2019  [facility] and [place] Number:  Herbalist and Address:  [facility],  [place] [address], [place]      Provider Number: [id number]  Attending Physician Name and Address:  Default, Provider, MD  Relative Name and Phone Number:  Delcie Roch [phone]    Current Level of Care: Hospital Recommended Level of Care: [place] Prior Approval Number:    Date Approved/Denied:   PASRR Number:    Discharge Plan: Home (ALF- [facility])    Current Diagnoses: Patient Active Problem List   Diagnosis Date Noted  . Chest pain 09/15/2017  . Primary localized osteoarthritis of right knee 07/21/2017  . First degree AV block 11/23/2013  . Diabetes mellitus without complication ([place])   . Schizophrenia ([place])   . Hypertension   . Dementia ([place])   . Depression   . ACE inhibitor-aggravated angioedema   . Diverticulosis     Orientation RESPIRATION BLADDER Height & Weight     Self, Time, Situation, Place  Normal Continent Weight:   Height:     BEHAVIORAL SYMPTOMS/MOOD NEUROLOGICAL BOWEL NUTRITION STATUS      Continent Diet  AMBULATORY STATUS COMMUNICATION OF NEEDS Skin     Verbally                         Personal Care Assistance Level of Assistance              Functional Limitations Info  Speech, Hearing, Sight Sight Info: Adequate Hearing Info: Adequate Speech Info: Adequate    SPECIAL CARE FACTORS FREQUENCY  Blood pressure                    Contractures Contractures Info: Not present    Additional Factors Info  Code Status, Allergies, Psychotropic Code Status Info: Full Allergies Info: Shellfish Allergy, Ace Inhibitors, Angiotensin Receptor Blockers, Lisinopril, Strawberry Extract, Robaxin (Methocarbamol). Psychotropic  Info: Schizophrenia, Depression: Cogentin 1 mg PO daily, Seroquel 100 mg PO Daily         Current Medications (01/26/2019):  This is the current hospital active medication list Current Facility-Administered Medications  Medication Dose Route Frequency Provider Last Rate Last Dose  . amLODipine (NORVASC) tablet 10 mg  10 mg Oral Daily Dorie Rank, MD   10 mg at 01/26/19 0941  . benztropine (COGENTIN) tablet 1 mg  1 mg Oral Daily Dorie Rank, MD   1 mg at 01/26/19 0940  . linaclotide (LINZESS) capsule 72 mcg  72 mcg Oral QAC breakfast Dorie Rank, MD   72 mcg at 01/26/19 0941  . memantine (NAMENDA) tablet 10 mg  10 mg Oral BID Dorie Rank, MD   10 mg at 01/26/19 0942  . metFORMIN (GLUCOPHAGE) tablet 1,000 mg  1,000 mg Oral BID WC Dorie Rank, MD   1,000 mg at 01/26/19 0941  . mirabegron ER (MYRBETRIQ) tablet 25 mg  25 mg Oral Daily Dorie Rank, MD   25 mg at 01/26/19 0941  . pantoprazole (PROTONIX) EC tablet 40 mg  40 mg Oral Daily Dorie Rank, MD   40 mg at 01/26/19 0941  . perphenazine (TRILAFON) tablet 5 mg  5 mg Oral Daily Dorie Rank, MD   5 mg at 01/26/19 0942  . QUEtiapine (SEROQUEL) tablet 100 mg  100 mg  Oral Pete Glatter, MD   100 mg at 01/25/19 2228  . traMADol (ULTRAM) tablet 50 mg  50 mg Oral Q12H PRN Dorie Rank, MD       Current Outpatient Medications  Medication Sig Dispense Refill  . alendronate (FOSAMAX) 70 MG tablet TAKE 1 TAB EACH WEEK 30 MIN PRIOR TO BREAKFAST WITH LARGE GLASS OF WATER. REMAIN UPRIGHT. 12 tablet 3  . amLODipine (NORVASC) 10 MG tablet Take 1 tablet (10 mg total) by mouth daily. 90 tablet 3  . aspirin 81 MG EC tablet TAKE (1) TABLET BY MOUTH ONCE DAILY. 90 tablet 3  . Calcium Citrate-Vitamin D (CITRACAL PETITES/VITAMIN D) 200-250 MG-UNIT TABS TAKE (2) TABLETS BY MOUTH TWICE DAILY. 120 each 5  . citalopram (CELEXA) 20 MG tablet Take 20 mg by mouth daily.    Marland Kitchen LANTUS 100 UNIT/ML injection INJECT 10 UNITS SUBCUTANEOUSLY EACH MORNING. 10 mL 2  . levothyroxine  (SYNTHROID, LEVOTHROID) 50 MCG tablet TAKE ONE TABLET BY MOUTH ONCE DAILY. 30 tablet 5  . LINZESS 72 MCG capsule TAKE (1) CAPSULE BY MOUTH DAILY BEFORE BREAKFAST. 30 capsule 5  . memantine (NAMENDA) 10 MG tablet Take 1 tablet (10 mg total) by mouth 2 (two) times daily. 60 tablet 2  . metFORMIN (GLUCOPHAGE) 1000 MG tablet TAKE (1) TABLET BY MOUTH TWICE DAILY. 60 tablet 3  . mirabegron ER (MYRBETRIQ) 25 MG TB24 tablet Take 1 tablet (25 mg total) by mouth daily. 90 tablet 3  . omeprazole (PRILOSEC) 40 MG capsule TAKE (1) CAPSULE BY MOUTH ONCE DAILY. 30 capsule 11  . Psyllium Fiber 0.52 g CAPS TAKE (1) CAPSULE BY MOUTH THREE TIMES A DAY WITH FULL 8OZ OF WATER. 90 capsule 5  . QUEtiapine (SEROQUEL) 100 MG tablet Take 1 tablet (100 mg total) by mouth at bedtime. 30 tablet 5  . traMADol (ULTRAM) 50 MG tablet TAKE (1) TABLET BY MOUTH TWICE DAILY. 60 tablet 2  . B-D INSULIN SYRINGE 29G X 1/2" 1 ML MISC USE AS DIRECTED. 100 each 0  . benztropine (COGENTIN) 1 MG tablet Take 1 mg by mouth daily.     Marland Kitchen perphenazine (TRILAFON) 4 MG tablet Take 1.5 tablets (6 mg total) by mouth daily. 15 tablet 0     Discharge Medications: Please see discharge summary for a list of discharge medications.  Relevant Imaging Results:  Relevant Lab Results:   Additional Information SSN: [id number]  Archie Endo, LCSW

## 2019-01-26 NOTE — Progress Notes (Signed)
CSW received consult for patient without a clear reason. CSW spoke with patient's daughter Delcie Roch at [phone] to identify any social needs, and Delcie Roch reports that the family was just seeking clarity as to what was going on with the patient. CSW explained that the patient had been psychiatrically cleared and was ready for discharge back to [facility]. Delcie Roch reports that her sister will likely provide patient with transportation back to the facility, but will call CSW back with a definite answer.  CSW attempted to reach admissions at [facility] without success, voicemail was left requesting a return call.   Madilyn Fireman, MSW, LCSW-A Clinical Social Worker   Transitions of [place] [facility]   Medical ICU [phone]

## 2019-01-26 NOTE — BH Assessment (Signed)
[facility] Assessment Progress Note  Per Buford Dresser, DO, this pt does not require psychiatric hospitalization at this time.  Pt presents under IVC initiated by pt's daughter and upheld by Lind Covert, LCSW, which Dr Mariea Clonts has rescinded.  Pt is to be discharged from [facility].  No behavioral health referrals are indicated.  Dr Mariea Clonts reports that she will be placing a social work consult for this pt to address her psychosocial needs.  Pt's nurse, Caryl Pina, has been notified.  Jalene Mullet, [place] Triage Specialist [phone]

## 2019-01-26 NOTE — ED Notes (Signed)
Reached back out to social work related to pt disposition plan , informed will call me back with information

## 2019-01-26 NOTE — ED Notes (Signed)
This nurse called social work related to pt disposition back to facility and social work consult, no answer.

## 2019-01-26 NOTE — Progress Notes (Addendum)
5:50p CSW followed up with Bonnie Watkins and patient can return to [facility]. Bonnie Watkins has questions about patient's medication changes. CSW called Dr. Mariea Watkins who reports they restarted patient on cogentin and seroquel. CSW relayed this to Bonnie Watkins.   Number for report [phone] ask for Bonnie Watkins. Patient will be going to room 131. Patient to be transported via [place] ([place] spoke with patient's daughter at 4:14p and was agreeable with [facility] transport).   4:11p Handoff received from 1st shift CSW. This CSW spoke with Bonnie Watkins with [facility] stating they need an updated FL2 and psych note stating patient has been cleared and is not a danger to herself or others. CSW faxed this information [phone]). CSW awaiting [facility] to confirm receipt of this information. CSW to follow up with patient's daughter Bonnie Watkins about transporting patient.  [place], LCSW Transitions [facility] [phone]

## 2019-02-07 ENCOUNTER — Telehealth: Payer: Self-pay | Admitting: Family Medicine

## 2019-02-07 NOTE — Telephone Encounter (Signed)
[phone] Patients daughter calling to discuss mental stability

## 2019-02-09 NOTE — Telephone Encounter (Signed)
Spoke to [place] and some things have occurred with pt and pt has had some behavioral issues. I called and spoke to the nurse at [facility], [place], and she explained that pt has gotten worse over the past several weeks about taking meds but nothing out of ordinary for dementia pts. She did state that they have been unable to get in touch with Dr. Hoyle Barr at [facility], pts daughter has called, pharmacy has called and even [facility] has called to no avail,  and she has been out of her Trilafon. Would you be willing to refill this for the pt? They are trying to get her care within the facility but they have paperwork that has to be completed and it is not immediate but they will eventually take over her complete care.

## 2019-02-10 ENCOUNTER — Other Ambulatory Visit: Payer: Self-pay | Admitting: Family Medicine

## 2019-02-10 MED ORDER — PERPHENAZINE 4 MG PO TABS: 6.0000 mg | ORAL_TABLET | Freq: Every day | ORAL | 0 refills | Status: DC

## 2019-02-10 NOTE — Telephone Encounter (Signed)
I will refill the Trilafon

## 2019-02-14 NOTE — Telephone Encounter (Signed)
Per nurse no need to call her back the pharm will contact her if refilled.

## 2019-02-16 DIAGNOSIS — F209 Schizophrenia, unspecified: Secondary | ICD-10-CM | POA: Diagnosis not present

## 2019-02-16 DIAGNOSIS — E039 Hypothyroidism, unspecified: Secondary | ICD-10-CM | POA: Diagnosis not present

## 2019-02-16 DIAGNOSIS — E108 Type 1 diabetes mellitus with unspecified complications: Secondary | ICD-10-CM | POA: Diagnosis not present

## 2019-02-16 DIAGNOSIS — M810 Age-related osteoporosis without current pathological fracture: Secondary | ICD-10-CM | POA: Diagnosis not present

## 2019-02-16 DIAGNOSIS — K5904 Chronic idiopathic constipation: Secondary | ICD-10-CM | POA: Diagnosis not present

## 2019-02-16 DIAGNOSIS — E559 Vitamin D deficiency, unspecified: Secondary | ICD-10-CM | POA: Diagnosis not present

## 2019-02-16 DIAGNOSIS — I10 Essential (primary) hypertension: Secondary | ICD-10-CM | POA: Diagnosis not present

## 2019-02-16 DIAGNOSIS — F0391 Unspecified dementia with behavioral disturbance: Secondary | ICD-10-CM | POA: Diagnosis not present

## 2019-02-16 DIAGNOSIS — N3281 Overactive bladder: Secondary | ICD-10-CM | POA: Diagnosis not present

## 2019-02-16 DIAGNOSIS — G894 Chronic pain syndrome: Secondary | ICD-10-CM | POA: Diagnosis not present

## 2019-02-16 DIAGNOSIS — K219 Gastro-esophageal reflux disease without esophagitis: Secondary | ICD-10-CM | POA: Diagnosis not present

## 2019-02-22 DIAGNOSIS — F209 Schizophrenia, unspecified: Secondary | ICD-10-CM | POA: Diagnosis not present

## 2019-02-22 DIAGNOSIS — F0391 Unspecified dementia with behavioral disturbance: Secondary | ICD-10-CM | POA: Diagnosis not present

## 2019-02-23 DIAGNOSIS — K5904 Chronic idiopathic constipation: Secondary | ICD-10-CM | POA: Diagnosis not present

## 2019-02-23 DIAGNOSIS — G894 Chronic pain syndrome: Secondary | ICD-10-CM | POA: Diagnosis not present

## 2019-03-20 DIAGNOSIS — F209 Schizophrenia, unspecified: Secondary | ICD-10-CM | POA: Diagnosis not present

## 2019-03-22 DIAGNOSIS — F209 Schizophrenia, unspecified: Secondary | ICD-10-CM | POA: Diagnosis not present

## 2019-03-22 DIAGNOSIS — F0391 Unspecified dementia with behavioral disturbance: Secondary | ICD-10-CM | POA: Diagnosis not present

## 2019-03-25 DIAGNOSIS — Q845 Enlarged and hypertrophic nails: Secondary | ICD-10-CM | POA: Diagnosis not present

## 2019-03-25 DIAGNOSIS — E108 Type 1 diabetes mellitus with unspecified complications: Secondary | ICD-10-CM | POA: Diagnosis not present

## 2019-03-25 DIAGNOSIS — I739 Peripheral vascular disease, unspecified: Secondary | ICD-10-CM | POA: Diagnosis not present

## 2019-03-25 DIAGNOSIS — L84 Corns and callosities: Secondary | ICD-10-CM | POA: Diagnosis not present

## 2019-03-25 DIAGNOSIS — B351 Tinea unguium: Secondary | ICD-10-CM | POA: Diagnosis not present

## 2019-03-29 DIAGNOSIS — F209 Schizophrenia, unspecified: Secondary | ICD-10-CM | POA: Diagnosis not present

## 2019-04-04 DIAGNOSIS — M6281 Muscle weakness (generalized): Secondary | ICD-10-CM | POA: Diagnosis not present

## 2019-04-04 DIAGNOSIS — R278 Other lack of coordination: Secondary | ICD-10-CM | POA: Diagnosis not present

## 2019-04-05 DIAGNOSIS — R293 Abnormal posture: Secondary | ICD-10-CM | POA: Diagnosis not present

## 2019-04-05 DIAGNOSIS — M6281 Muscle weakness (generalized): Secondary | ICD-10-CM | POA: Diagnosis not present

## 2019-04-05 DIAGNOSIS — R278 Other lack of coordination: Secondary | ICD-10-CM | POA: Diagnosis not present

## 2019-04-06 DIAGNOSIS — M6281 Muscle weakness (generalized): Secondary | ICD-10-CM | POA: Diagnosis not present

## 2019-04-06 DIAGNOSIS — R278 Other lack of coordination: Secondary | ICD-10-CM | POA: Diagnosis not present

## 2019-04-06 DIAGNOSIS — R293 Abnormal posture: Secondary | ICD-10-CM | POA: Diagnosis not present

## 2019-04-11 DIAGNOSIS — R278 Other lack of coordination: Secondary | ICD-10-CM | POA: Diagnosis not present

## 2019-04-11 DIAGNOSIS — M6281 Muscle weakness (generalized): Secondary | ICD-10-CM | POA: Diagnosis not present

## 2019-04-12 DIAGNOSIS — R278 Other lack of coordination: Secondary | ICD-10-CM | POA: Diagnosis not present

## 2019-04-12 DIAGNOSIS — F209 Schizophrenia, unspecified: Secondary | ICD-10-CM | POA: Diagnosis not present

## 2019-04-12 DIAGNOSIS — M6281 Muscle weakness (generalized): Secondary | ICD-10-CM | POA: Diagnosis not present

## 2019-04-14 DIAGNOSIS — R278 Other lack of coordination: Secondary | ICD-10-CM | POA: Diagnosis not present

## 2019-04-14 DIAGNOSIS — M6281 Muscle weakness (generalized): Secondary | ICD-10-CM | POA: Diagnosis not present

## 2019-04-14 DIAGNOSIS — Z20828 Contact with and (suspected) exposure to other viral communicable diseases: Secondary | ICD-10-CM | POA: Diagnosis not present

## 2019-04-18 DIAGNOSIS — M6281 Muscle weakness (generalized): Secondary | ICD-10-CM | POA: Diagnosis not present

## 2019-04-18 DIAGNOSIS — R278 Other lack of coordination: Secondary | ICD-10-CM | POA: Diagnosis not present

## 2019-04-19 DIAGNOSIS — F209 Schizophrenia, unspecified: Secondary | ICD-10-CM | POA: Diagnosis not present

## 2019-04-19 DIAGNOSIS — Z20828 Contact with and (suspected) exposure to other viral communicable diseases: Secondary | ICD-10-CM | POA: Diagnosis not present

## 2019-04-19 DIAGNOSIS — R278 Other lack of coordination: Secondary | ICD-10-CM | POA: Diagnosis not present

## 2019-04-19 DIAGNOSIS — M6281 Muscle weakness (generalized): Secondary | ICD-10-CM | POA: Diagnosis not present

## 2019-04-19 DIAGNOSIS — F0391 Unspecified dementia with behavioral disturbance: Secondary | ICD-10-CM | POA: Diagnosis not present

## 2019-04-20 DIAGNOSIS — M6281 Muscle weakness (generalized): Secondary | ICD-10-CM | POA: Diagnosis not present

## 2019-04-20 DIAGNOSIS — R278 Other lack of coordination: Secondary | ICD-10-CM | POA: Diagnosis not present

## 2019-04-21 DIAGNOSIS — R278 Other lack of coordination: Secondary | ICD-10-CM | POA: Diagnosis not present

## 2019-04-21 DIAGNOSIS — M6281 Muscle weakness (generalized): Secondary | ICD-10-CM | POA: Diagnosis not present

## 2019-04-22 DIAGNOSIS — M6281 Muscle weakness (generalized): Secondary | ICD-10-CM | POA: Diagnosis not present

## 2019-04-22 DIAGNOSIS — R278 Other lack of coordination: Secondary | ICD-10-CM | POA: Diagnosis not present

## 2019-04-25 DIAGNOSIS — R278 Other lack of coordination: Secondary | ICD-10-CM | POA: Diagnosis not present

## 2019-04-25 DIAGNOSIS — M6281 Muscle weakness (generalized): Secondary | ICD-10-CM | POA: Diagnosis not present

## 2019-04-26 DIAGNOSIS — M6281 Muscle weakness (generalized): Secondary | ICD-10-CM | POA: Diagnosis not present

## 2019-04-26 DIAGNOSIS — R278 Other lack of coordination: Secondary | ICD-10-CM | POA: Diagnosis not present

## 2019-04-27 DIAGNOSIS — R278 Other lack of coordination: Secondary | ICD-10-CM | POA: Diagnosis not present

## 2019-04-27 DIAGNOSIS — M6281 Muscle weakness (generalized): Secondary | ICD-10-CM | POA: Diagnosis not present

## 2019-04-28 DIAGNOSIS — R278 Other lack of coordination: Secondary | ICD-10-CM | POA: Diagnosis not present

## 2019-04-28 DIAGNOSIS — M6281 Muscle weakness (generalized): Secondary | ICD-10-CM | POA: Diagnosis not present

## 2019-04-29 DIAGNOSIS — R278 Other lack of coordination: Secondary | ICD-10-CM | POA: Diagnosis not present

## 2019-04-29 DIAGNOSIS — M6281 Muscle weakness (generalized): Secondary | ICD-10-CM | POA: Diagnosis not present

## 2019-05-02 DIAGNOSIS — R278 Other lack of coordination: Secondary | ICD-10-CM | POA: Diagnosis not present

## 2019-05-02 DIAGNOSIS — M6281 Muscle weakness (generalized): Secondary | ICD-10-CM | POA: Diagnosis not present

## 2019-05-03 DIAGNOSIS — R278 Other lack of coordination: Secondary | ICD-10-CM | POA: Diagnosis not present

## 2019-05-03 DIAGNOSIS — M6281 Muscle weakness (generalized): Secondary | ICD-10-CM | POA: Diagnosis not present

## 2019-05-04 DIAGNOSIS — R278 Other lack of coordination: Secondary | ICD-10-CM | POA: Diagnosis not present

## 2019-05-04 DIAGNOSIS — M6281 Muscle weakness (generalized): Secondary | ICD-10-CM | POA: Diagnosis not present

## 2019-05-05 DIAGNOSIS — M6281 Muscle weakness (generalized): Secondary | ICD-10-CM | POA: Diagnosis not present

## 2019-05-05 DIAGNOSIS — R278 Other lack of coordination: Secondary | ICD-10-CM | POA: Diagnosis not present

## 2019-05-10 DIAGNOSIS — R278 Other lack of coordination: Secondary | ICD-10-CM | POA: Diagnosis not present

## 2019-05-10 DIAGNOSIS — M6281 Muscle weakness (generalized): Secondary | ICD-10-CM | POA: Diagnosis not present

## 2019-05-11 DIAGNOSIS — M6281 Muscle weakness (generalized): Secondary | ICD-10-CM | POA: Diagnosis not present

## 2019-05-11 DIAGNOSIS — R278 Other lack of coordination: Secondary | ICD-10-CM | POA: Diagnosis not present

## 2019-05-16 DIAGNOSIS — R278 Other lack of coordination: Secondary | ICD-10-CM | POA: Diagnosis not present

## 2019-05-16 DIAGNOSIS — R293 Abnormal posture: Secondary | ICD-10-CM | POA: Diagnosis not present

## 2019-05-16 DIAGNOSIS — M6281 Muscle weakness (generalized): Secondary | ICD-10-CM | POA: Diagnosis not present

## 2019-05-17 DIAGNOSIS — F0391 Unspecified dementia with behavioral disturbance: Secondary | ICD-10-CM | POA: Diagnosis not present

## 2019-05-17 DIAGNOSIS — F209 Schizophrenia, unspecified: Secondary | ICD-10-CM | POA: Diagnosis not present

## 2019-05-17 DIAGNOSIS — Z20828 Contact with and (suspected) exposure to other viral communicable diseases: Secondary | ICD-10-CM | POA: Diagnosis not present

## 2019-05-19 DIAGNOSIS — M6281 Muscle weakness (generalized): Secondary | ICD-10-CM | POA: Diagnosis not present

## 2019-05-19 DIAGNOSIS — R278 Other lack of coordination: Secondary | ICD-10-CM | POA: Diagnosis not present

## 2019-05-23 DIAGNOSIS — R293 Abnormal posture: Secondary | ICD-10-CM | POA: Diagnosis not present

## 2019-05-23 DIAGNOSIS — M6281 Muscle weakness (generalized): Secondary | ICD-10-CM | POA: Diagnosis not present

## 2019-05-23 DIAGNOSIS — R278 Other lack of coordination: Secondary | ICD-10-CM | POA: Diagnosis not present

## 2019-05-24 DIAGNOSIS — M6281 Muscle weakness (generalized): Secondary | ICD-10-CM | POA: Diagnosis not present

## 2019-05-24 DIAGNOSIS — R278 Other lack of coordination: Secondary | ICD-10-CM | POA: Diagnosis not present

## 2019-05-26 DIAGNOSIS — R278 Other lack of coordination: Secondary | ICD-10-CM | POA: Diagnosis not present

## 2019-05-26 DIAGNOSIS — M6281 Muscle weakness (generalized): Secondary | ICD-10-CM | POA: Diagnosis not present

## 2019-05-26 DIAGNOSIS — F209 Schizophrenia, unspecified: Secondary | ICD-10-CM | POA: Diagnosis not present

## 2019-05-27 DIAGNOSIS — R293 Abnormal posture: Secondary | ICD-10-CM | POA: Diagnosis not present

## 2019-05-27 DIAGNOSIS — M6281 Muscle weakness (generalized): Secondary | ICD-10-CM | POA: Diagnosis not present

## 2019-05-27 DIAGNOSIS — R278 Other lack of coordination: Secondary | ICD-10-CM | POA: Diagnosis not present

## 2019-05-30 DIAGNOSIS — H40013 Open angle with borderline findings, low risk, bilateral: Secondary | ICD-10-CM | POA: Diagnosis not present

## 2019-05-30 DIAGNOSIS — R293 Abnormal posture: Secondary | ICD-10-CM | POA: Diagnosis not present

## 2019-05-30 DIAGNOSIS — H26491 Other secondary cataract, right eye: Secondary | ICD-10-CM | POA: Diagnosis not present

## 2019-05-30 DIAGNOSIS — H35033 Hypertensive retinopathy, bilateral: Secondary | ICD-10-CM | POA: Diagnosis not present

## 2019-05-30 DIAGNOSIS — H35013 Changes in retinal vascular appearance, bilateral: Secondary | ICD-10-CM | POA: Diagnosis not present

## 2019-05-30 DIAGNOSIS — R278 Other lack of coordination: Secondary | ICD-10-CM | POA: Diagnosis not present

## 2019-05-30 DIAGNOSIS — H26493 Other secondary cataract, bilateral: Secondary | ICD-10-CM | POA: Diagnosis not present

## 2019-05-30 DIAGNOSIS — M6281 Muscle weakness (generalized): Secondary | ICD-10-CM | POA: Diagnosis not present

## 2019-05-31 DIAGNOSIS — M6281 Muscle weakness (generalized): Secondary | ICD-10-CM | POA: Diagnosis not present

## 2019-05-31 DIAGNOSIS — R293 Abnormal posture: Secondary | ICD-10-CM | POA: Diagnosis not present

## 2019-05-31 DIAGNOSIS — R278 Other lack of coordination: Secondary | ICD-10-CM | POA: Diagnosis not present

## 2019-06-01 DIAGNOSIS — R293 Abnormal posture: Secondary | ICD-10-CM | POA: Diagnosis not present

## 2019-06-01 DIAGNOSIS — M6281 Muscle weakness (generalized): Secondary | ICD-10-CM | POA: Diagnosis not present

## 2019-06-01 DIAGNOSIS — R278 Other lack of coordination: Secondary | ICD-10-CM | POA: Diagnosis not present

## 2019-06-02 DIAGNOSIS — R278 Other lack of coordination: Secondary | ICD-10-CM | POA: Diagnosis not present

## 2019-06-02 DIAGNOSIS — M6281 Muscle weakness (generalized): Secondary | ICD-10-CM | POA: Diagnosis not present

## 2019-06-06 DIAGNOSIS — R278 Other lack of coordination: Secondary | ICD-10-CM | POA: Diagnosis not present

## 2019-06-06 DIAGNOSIS — E559 Vitamin D deficiency, unspecified: Secondary | ICD-10-CM | POA: Diagnosis not present

## 2019-06-06 DIAGNOSIS — M810 Age-related osteoporosis without current pathological fracture: Secondary | ICD-10-CM | POA: Diagnosis not present

## 2019-06-06 DIAGNOSIS — E119 Type 2 diabetes mellitus without complications: Secondary | ICD-10-CM | POA: Diagnosis not present

## 2019-06-06 DIAGNOSIS — N3281 Overactive bladder: Secondary | ICD-10-CM | POA: Diagnosis not present

## 2019-06-06 DIAGNOSIS — K5904 Chronic idiopathic constipation: Secondary | ICD-10-CM | POA: Diagnosis not present

## 2019-06-06 DIAGNOSIS — M6281 Muscle weakness (generalized): Secondary | ICD-10-CM | POA: Diagnosis not present

## 2019-06-07 DIAGNOSIS — R278 Other lack of coordination: Secondary | ICD-10-CM | POA: Diagnosis not present

## 2019-06-07 DIAGNOSIS — M6281 Muscle weakness (generalized): Secondary | ICD-10-CM | POA: Diagnosis not present

## 2019-06-07 DIAGNOSIS — F209 Schizophrenia, unspecified: Secondary | ICD-10-CM | POA: Diagnosis not present

## 2019-06-10 DIAGNOSIS — R278 Other lack of coordination: Secondary | ICD-10-CM | POA: Diagnosis not present

## 2019-06-10 DIAGNOSIS — M6281 Muscle weakness (generalized): Secondary | ICD-10-CM | POA: Diagnosis not present

## 2019-06-10 DIAGNOSIS — R293 Abnormal posture: Secondary | ICD-10-CM | POA: Diagnosis not present

## 2019-06-13 DIAGNOSIS — R278 Other lack of coordination: Secondary | ICD-10-CM | POA: Diagnosis not present

## 2019-06-13 DIAGNOSIS — R293 Abnormal posture: Secondary | ICD-10-CM | POA: Diagnosis not present

## 2019-06-13 DIAGNOSIS — M6281 Muscle weakness (generalized): Secondary | ICD-10-CM | POA: Diagnosis not present

## 2019-06-14 DIAGNOSIS — R293 Abnormal posture: Secondary | ICD-10-CM | POA: Diagnosis not present

## 2019-06-14 DIAGNOSIS — F0391 Unspecified dementia with behavioral disturbance: Secondary | ICD-10-CM | POA: Diagnosis not present

## 2019-06-14 DIAGNOSIS — F209 Schizophrenia, unspecified: Secondary | ICD-10-CM | POA: Diagnosis not present

## 2019-06-14 DIAGNOSIS — R278 Other lack of coordination: Secondary | ICD-10-CM | POA: Diagnosis not present

## 2019-06-14 DIAGNOSIS — M6281 Muscle weakness (generalized): Secondary | ICD-10-CM | POA: Diagnosis not present

## 2019-06-16 DIAGNOSIS — R278 Other lack of coordination: Secondary | ICD-10-CM | POA: Diagnosis not present

## 2019-06-16 DIAGNOSIS — H26492 Other secondary cataract, left eye: Secondary | ICD-10-CM | POA: Diagnosis not present

## 2019-06-16 DIAGNOSIS — M6281 Muscle weakness (generalized): Secondary | ICD-10-CM | POA: Diagnosis not present

## 2019-06-17 DIAGNOSIS — M6281 Muscle weakness (generalized): Secondary | ICD-10-CM | POA: Diagnosis not present

## 2019-06-17 DIAGNOSIS — R293 Abnormal posture: Secondary | ICD-10-CM | POA: Diagnosis not present

## 2019-06-17 DIAGNOSIS — R278 Other lack of coordination: Secondary | ICD-10-CM | POA: Diagnosis not present

## 2019-06-19 DIAGNOSIS — M6281 Muscle weakness (generalized): Secondary | ICD-10-CM | POA: Diagnosis not present

## 2019-06-19 DIAGNOSIS — R278 Other lack of coordination: Secondary | ICD-10-CM | POA: Diagnosis not present

## 2019-06-20 DIAGNOSIS — G894 Chronic pain syndrome: Secondary | ICD-10-CM | POA: Diagnosis not present

## 2019-06-21 DIAGNOSIS — R278 Other lack of coordination: Secondary | ICD-10-CM | POA: Diagnosis not present

## 2019-06-21 DIAGNOSIS — R293 Abnormal posture: Secondary | ICD-10-CM | POA: Diagnosis not present

## 2019-06-21 DIAGNOSIS — M6281 Muscle weakness (generalized): Secondary | ICD-10-CM | POA: Diagnosis not present

## 2019-06-22 DIAGNOSIS — R293 Abnormal posture: Secondary | ICD-10-CM | POA: Diagnosis not present

## 2019-06-22 DIAGNOSIS — R278 Other lack of coordination: Secondary | ICD-10-CM | POA: Diagnosis not present

## 2019-06-22 DIAGNOSIS — M6281 Muscle weakness (generalized): Secondary | ICD-10-CM | POA: Diagnosis not present

## 2019-06-23 DIAGNOSIS — R278 Other lack of coordination: Secondary | ICD-10-CM | POA: Diagnosis not present

## 2019-06-23 DIAGNOSIS — M6281 Muscle weakness (generalized): Secondary | ICD-10-CM | POA: Diagnosis not present

## 2019-07-04 DIAGNOSIS — I10 Essential (primary) hypertension: Secondary | ICD-10-CM | POA: Diagnosis not present

## 2019-07-04 DIAGNOSIS — E119 Type 2 diabetes mellitus without complications: Secondary | ICD-10-CM | POA: Diagnosis not present

## 2019-07-04 DIAGNOSIS — E039 Hypothyroidism, unspecified: Secondary | ICD-10-CM | POA: Diagnosis not present

## 2019-07-04 DIAGNOSIS — K219 Gastro-esophageal reflux disease without esophagitis: Secondary | ICD-10-CM | POA: Diagnosis not present

## 2019-07-04 DIAGNOSIS — R600 Localized edema: Secondary | ICD-10-CM | POA: Diagnosis not present

## 2019-07-05 DIAGNOSIS — F209 Schizophrenia, unspecified: Secondary | ICD-10-CM | POA: Diagnosis not present

## 2019-07-12 DIAGNOSIS — F0391 Unspecified dementia with behavioral disturbance: Secondary | ICD-10-CM | POA: Diagnosis not present

## 2019-07-12 DIAGNOSIS — F209 Schizophrenia, unspecified: Secondary | ICD-10-CM | POA: Diagnosis not present

## 2019-07-18 DIAGNOSIS — G894 Chronic pain syndrome: Secondary | ICD-10-CM | POA: Diagnosis not present

## 2019-07-18 DIAGNOSIS — R0602 Shortness of breath: Secondary | ICD-10-CM | POA: Diagnosis not present

## 2019-07-19 DIAGNOSIS — F0391 Unspecified dementia with behavioral disturbance: Secondary | ICD-10-CM | POA: Diagnosis not present

## 2019-07-19 DIAGNOSIS — F209 Schizophrenia, unspecified: Secondary | ICD-10-CM | POA: Diagnosis not present

## 2019-07-25 DIAGNOSIS — K5904 Chronic idiopathic constipation: Secondary | ICD-10-CM | POA: Diagnosis not present

## 2019-07-25 DIAGNOSIS — J189 Pneumonia, unspecified organism: Secondary | ICD-10-CM | POA: Diagnosis not present

## 2019-08-01 DIAGNOSIS — I10 Essential (primary) hypertension: Secondary | ICD-10-CM | POA: Diagnosis not present

## 2019-08-01 DIAGNOSIS — E039 Hypothyroidism, unspecified: Secondary | ICD-10-CM | POA: Diagnosis not present

## 2019-08-01 DIAGNOSIS — M25552 Pain in left hip: Secondary | ICD-10-CM | POA: Diagnosis not present

## 2019-08-01 DIAGNOSIS — E119 Type 2 diabetes mellitus without complications: Secondary | ICD-10-CM | POA: Diagnosis not present

## 2019-08-02 ENCOUNTER — Emergency Department (HOSPITAL_COMMUNITY): Payer: Medicare Other

## 2019-08-02 ENCOUNTER — Other Ambulatory Visit: Payer: Self-pay

## 2019-08-02 ENCOUNTER — Encounter (HOSPITAL_COMMUNITY): Payer: Self-pay | Admitting: Emergency Medicine

## 2019-08-02 ENCOUNTER — Emergency Department (HOSPITAL_COMMUNITY)
Admission: EM | Admit: 2019-08-02 | Discharge: 2019-08-02 | Disposition: A | Discharge status: Home / Self Care | Payer: Medicare Other | Attending: Emergency Medicine | Admitting: Emergency Medicine

## 2019-08-02 DIAGNOSIS — Y9389 Activity, other specified: Secondary | ICD-10-CM | POA: Diagnosis not present

## 2019-08-02 DIAGNOSIS — S0990XA Unspecified injury of head, initial encounter: Secondary | ICD-10-CM | POA: Diagnosis not present

## 2019-08-02 DIAGNOSIS — I499 Cardiac arrhythmia, unspecified: Secondary | ICD-10-CM | POA: Diagnosis not present

## 2019-08-02 DIAGNOSIS — Z79899 Other long term (current) drug therapy: Secondary | ICD-10-CM | POA: Insufficient documentation

## 2019-08-02 DIAGNOSIS — S0083XA Contusion of other part of head, initial encounter: Secondary | ICD-10-CM | POA: Insufficient documentation

## 2019-08-02 DIAGNOSIS — I10 Essential (primary) hypertension: Secondary | ICD-10-CM | POA: Diagnosis not present

## 2019-08-02 DIAGNOSIS — M542 Cervicalgia: Secondary | ICD-10-CM | POA: Insufficient documentation

## 2019-08-02 DIAGNOSIS — Y999 Unspecified external cause status: Secondary | ICD-10-CM | POA: Insufficient documentation

## 2019-08-02 DIAGNOSIS — W19XXXA Unspecified fall, initial encounter: Secondary | ICD-10-CM | POA: Insufficient documentation

## 2019-08-02 DIAGNOSIS — R5381 Other malaise: Secondary | ICD-10-CM | POA: Diagnosis not present

## 2019-08-02 DIAGNOSIS — M2550 Pain in unspecified joint: Secondary | ICD-10-CM | POA: Diagnosis not present

## 2019-08-02 DIAGNOSIS — E119 Type 2 diabetes mellitus without complications: Secondary | ICD-10-CM | POA: Diagnosis not present

## 2019-08-02 DIAGNOSIS — S79912A Unspecified injury of left hip, initial encounter: Secondary | ICD-10-CM | POA: Diagnosis not present

## 2019-08-02 DIAGNOSIS — Y92121 Bathroom in nursing home as the place of occurrence of the external cause: Secondary | ICD-10-CM | POA: Diagnosis not present

## 2019-08-02 DIAGNOSIS — M79652 Pain in left thigh: Secondary | ICD-10-CM | POA: Insufficient documentation

## 2019-08-02 DIAGNOSIS — F29 Unspecified psychosis not due to a substance or known physiological condition: Secondary | ICD-10-CM | POA: Diagnosis not present

## 2019-08-02 DIAGNOSIS — S0093XA Contusion of unspecified part of head, initial encounter: Secondary | ICD-10-CM

## 2019-08-02 DIAGNOSIS — R220 Localized swelling, mass and lump, head: Secondary | ICD-10-CM | POA: Diagnosis not present

## 2019-08-02 DIAGNOSIS — Z7984 Long term (current) use of oral hypoglycemic drugs: Secondary | ICD-10-CM | POA: Insufficient documentation

## 2019-08-02 DIAGNOSIS — Y92129 Unspecified place in nursing home as the place of occurrence of the external cause: Secondary | ICD-10-CM

## 2019-08-02 DIAGNOSIS — S199XXA Unspecified injury of neck, initial encounter: Secondary | ICD-10-CM | POA: Diagnosis not present

## 2019-08-02 DIAGNOSIS — Z7401 Bed confinement status: Secondary | ICD-10-CM | POA: Diagnosis not present

## 2019-08-02 DIAGNOSIS — R531 Weakness: Secondary | ICD-10-CM | POA: Diagnosis not present

## 2019-08-02 DIAGNOSIS — R009 Unspecified abnormalities of heart beat: Secondary | ICD-10-CM

## 2019-08-02 DIAGNOSIS — Z7982 Long term (current) use of aspirin: Secondary | ICD-10-CM | POA: Diagnosis not present

## 2019-08-02 MED ORDER — FENTANYL CITRATE (PF) 100 MCG/2ML IJ SOLN
50.0000 ug | Freq: Once | INTRAMUSCULAR | Status: AC
  Administered 2019-08-02: 05:00:00 50 ug via INTRAVENOUS
  Filled 2019-08-02: qty 2

## 2019-08-02 NOTE — ED Provider Notes (Signed)
9:10 AM As patient was being discharged and picked up by the transportation service, he was noted to have some increased heart rate.  We performed several EKGs which showed variable heart speeds into the 120s and back into the 80s.  A close examination of the multiple EKGs do not show convincing evidence of atrial fibrillation as it appears all of the QRS complexes have a P wave.  Does appear there is some irregularity however.  I assessed the patient and she denies any chest pain, palpitations, shortness of breath.  She does not feel lightheaded and near syncopal.  We discussed starting from scratch and doing work-up with labs and further management however given her otherwise well appearance and her rhythm appearing sinus, we feel she is still safe to go home.  We do recommend she follow-up with her PCP in the next 24 to 48 hours as well as cardiology.  Patient agrees with this plan and strict return precautions.  Patient discharged in good condition and appears to be in sinus rhythm at this time.   Cataleia Gade, Gwenyth Allegra, MD 08/02/19 [phone]

## 2019-08-02 NOTE — ED Notes (Signed)
[facility] contacted for transportation. Paperwork at nurses station. 

## 2019-08-02 NOTE — ED Notes (Addendum)
Patient transported to CT. Will give pain medication afterwards.

## 2019-08-02 NOTE — ED Provider Notes (Signed)
[facility] DEPT Provider Note   CSN: [id number] Arrival date & time: 08/02/19  0202   Time seen 3:55 AM  History Chief Complaint  Patient presents with  . [place] is a 85 y.o. female.  HPI   Patient lives in a nursing facility and gets around in a wheelchair.  Around 1 AM this morning she fell face forward in the bathroom and hit her head and somehow injured her left hip.  She states she is having cramping in her left thigh.  She denies nausea, vomiting, change in vision, but she does states her neck feels a little bit stiff.  She denies any numbness in her legs.  She states the only blood thinner she takes is a baby aspirin a day.  PCP Susy Frizzle, MD   Past Medical History:  Diagnosis Date  . ACE inhibitor-aggravated angioedema   . Dementia ([place])   . Depression   . Diabetes mellitus without complication ([place])   . Diverticulosis   . GERD (gastroesophageal reflux disease)   . Hypertension   . Osteoporosis   . Schizophrenia [facility])     Patient Active Problem List   Diagnosis Date Noted  . Chest pain 09/15/2017  . Primary localized osteoarthritis of right knee 07/21/2017  . First degree AV block 11/23/2013  . Diabetes mellitus without complication ([place])   . Schizophrenia ([place])   . Hypertension   . Dementia ([place])   . Depression   . ACE inhibitor-aggravated angioedema   . Diverticulosis     History reviewed. No pertinent surgical history.   OB History   No obstetric history on file.     Family History  Problem Relation Age of Onset  . Diabetes Mother   . Hypertension Father     Social History   Tobacco Use  . Smoking status: Former Research scientist (life sciences)  . Smokeless tobacco: Never Used  Substance Use Topics  . Alcohol use: No  . Drug use: No  Wheelchair Lives in nursing facility  Home Medications Prior to Admission medications   Medication Sig Start Date End Date Taking? Authorizing Provider    alendronate (FOSAMAX) 70 MG tablet TAKE 1 TAB EACH WEEK 30 MIN PRIOR TO BREAKFAST WITH LARGE GLASS OF WATER. REMAIN UPRIGHT. Patient taking differently: Take 70 mg by mouth every Monday.  10/19/17  Yes Susy Frizzle, MD  amLODipine (NORVASC) 10 MG tablet Take 1 tablet (10 mg total) by mouth daily. 10/01/17  Yes Susy Frizzle, MD  aspirin 81 MG EC tablet TAKE (1) TABLET BY MOUTH ONCE DAILY. Patient taking differently: Take 81 mg by mouth daily.  05/19/17  Yes Susy Frizzle, MD  benztropine (COGENTIN) 1 MG tablet Take 1 mg by mouth daily.    Yes [provider]  Calcium Citrate-Vitamin D (CITRACAL PETITES/VITAMIN D) 200-250 MG-UNIT TABS TAKE (2) TABLETS BY MOUTH TWICE DAILY. Patient taking differently: Take 2 tablets by mouth in the morning and at bedtime.  12/31/16  Yes Susy Frizzle, MD  citalopram (CELEXA) 20 MG tablet Take 20 mg by mouth daily.   Yes [provider]  furosemide (LASIX) 20 MG tablet Take 20 mg by mouth. 07/05/19 08/04/19 Yes [provider]  LANTUS 100 UNIT/ML injection INJECT 10 UNITS SUBCUTANEOUSLY EACH MORNING. Patient taking differently: Inject 10 Units into the skin in the morning.  05/10/18  Yes Susy Frizzle, MD  levothyroxine (SYNTHROID, LEVOTHROID) 50 MCG tablet TAKE ONE TABLET BY  MOUTH ONCE DAILY. Patient taking differently: Take 50 mcg by mouth daily before breakfast.  11/19/16  Yes Pickard, Cammie Mcgee, MD  LINZESS 72 MCG capsule TAKE (1) CAPSULE BY MOUTH DAILY BEFORE BREAKFAST. Patient taking differently: Take 72 mcg by mouth daily before breakfast.  04/19/18  Yes Pickard, Cammie Mcgee, MD  memantine (NAMENDA) 10 MG tablet Take 1 tablet (10 mg total) by mouth 2 (two) times daily. 09/21/12  Yes Susy Frizzle, MD  metFORMIN (GLUCOPHAGE) 1000 MG tablet TAKE (1) TABLET BY MOUTH TWICE DAILY. Patient taking differently: Take 1,000 mg by mouth 2 (two) times daily with a meal.  11/18/16  Yes Pickard, Cammie Mcgee, MD  mirabegron ER (MYRBETRIQ)  25 MG TB24 tablet Take 1 tablet (25 mg total) by mouth daily. 05/25/18  Yes Susy Frizzle, MD  omeprazole (PRILOSEC) 40 MG capsule TAKE (1) CAPSULE BY MOUTH ONCE DAILY. Patient taking differently: Take 40 mg by mouth daily.  12/31/16  Yes Susy Frizzle, MD  perphenazine (TRILAFON) 4 MG tablet Take 1.5 tablets (6 mg total) by mouth at bedtime. Patient taking differently: Take 6 mg by mouth in the morning.  02/10/19  Yes Susy Frizzle, MD  polyethylene glycol (MIRALAX / GLYCOLAX) 17 g packet Take 17 g by mouth daily.   Yes [provider]  Psyllium Fiber 0.52 g CAPS TAKE (1) CAPSULE BY MOUTH THREE TIMES A DAY WITH FULL 8OZ OF WATER. Patient taking differently: Take 52 g by mouth daily.  04/19/18  Yes Susy Frizzle, MD  QUEtiapine (SEROQUEL) 100 MG tablet Take 1 tablet (100 mg total) by mouth at bedtime. 12/07/15  Yes Susy Frizzle, MD  QUEtiapine (SEROQUEL) 25 MG tablet Take 25 mg by mouth in the morning.   Yes [provider]  traMADol-acetaminophen (ULTRACET) 37.5-325 MG tablet Take 1 tablet by mouth in the morning, at noon, and at bedtime.   Yes [provider]  vitamin B-12 (CYANOCOBALAMIN) 1000 MCG tablet Take 1,000 mcg by mouth daily.   Yes [provider]  B-D INSULIN SYRINGE 29G X 1/2" 1 ML MISC USE AS DIRECTED. 09/27/18   Susy Frizzle, MD  traMADol (ULTRAM) 50 MG tablet TAKE (1) TABLET BY MOUTH TWICE DAILY. Patient not taking: Reported on 08/02/2019 01/16/17   Susy Frizzle, MD    Allergies    Shellfish allergy, Ace inhibitors, Angiotensin receptor blockers, Lisinopril, Strawberry extract, and Robaxin [methocarbamol]  Review of Systems   Review of Systems  All other systems reviewed and are negative.   Physical Exam Updated Vital Signs BP (!) 156/68 (BP Location: Right Arm)   Pulse 71   Temp 98.3 F (36.8 C) (Oral)   Resp 14   Ht 5\' 4"  (1.626 m)   Wt 106.1 kg   SpO2 97%   BMI 40.17 kg/m   Physical Exam Vitals and  nursing note reviewed.  Constitutional:      General: She is not in acute distress.    Appearance: Normal appearance. She is obese.  HENT:     Head: Normocephalic.     Comments: Patient has a large hematoma of her left forehead.    Right Ear: External ear normal.     Left Ear: External ear normal.     Nose: Nose normal.  Eyes:     Extraocular Movements: Extraocular movements intact.     Conjunctiva/sclera: Conjunctivae normal.     Pupils: Pupils are equal, round, and reactive to light.  Neck:  Comments: When I palpate her cervical spine she states it feels stiff Cardiovascular:     Rate and Rhythm: Normal rate and regular rhythm.     Pulses: Normal pulses.  Pulmonary:     Effort: Pulmonary effort is normal.     Breath sounds: Normal breath sounds.  Chest:     Chest wall: No tenderness.  Abdominal:     General: Bowel sounds are normal. There is distension.     Tenderness: There is no abdominal tenderness.  Musculoskeletal:     Cervical back: Normal range of motion and neck supple. Tenderness present.     Comments: Patient will randomly yell out and grab her left hip.  There may be a slight shortening of the left lower leg compared to the right but there is no internal or external rotation.  She does have 1+ pitting edema on the top of both feet and the lower leg up to the knee.  Skin:    General: Skin is warm and dry.     Findings: No erythema.  Neurological:     General: No focal deficit present.     Mental Status: She is alert and oriented to person, place, and time.     Cranial Nerves: No cranial nerve deficit.  Psychiatric:        Mood and Affect: Mood normal.        Behavior: Behavior normal.        Thought Content: Thought content normal.       ED Results / Procedures / Treatments   Labs (all labs ordered are listed, but only abnormal results are displayed) Labs Reviewed - No data to display  EKG None  Radiology CT Head Wo Contrast  CT Cervical Spine  Wo Contrast  Result Date: 08/02/2019 CLINICAL DATA:  Fall hit forehead EXAM: CT HEAD WITHOUT CONTRAST TECHNIQUE: Contiguous axial images were obtained from the base of the skull through the vertex without intravenous contrast. COMPARISON:  November 12, 2017 FINDINGS: Brain: No evidence of acute territorial infarction, hemorrhage, hydrocephalus,extra-axial collection or mass lesion/mass effect. There is dilatation the ventricles and sulci consistent with age-related atrophy. Low-attenuation changes in the deep white matter consistent with small vessel ischemia. Vascular: No hyperdense vessel or unexpected calcification. Skull: The skull is intact. No fracture or focal lesion identified. Sinuses/Orbits: The visualized paranasal sinuses and mastoid air cells are clear. The orbits and globes intact. Other: Soft tissue swelling with a small hematoma seen overlying the left frontal skull. Cervical spine: Alignment: There is straightening of the normal cervical lordosis. Skull base and vertebrae: Visualized skull base is intact. No atlanto-occipital dissociation. The vertebral body heights are well maintained. No fracture or pathologic osseous lesion seen. Soft tissues and spinal canal: The visualized paraspinal soft tissues are unremarkable. No prevertebral soft tissue swelling is seen. The spinal canal is grossly unremarkable, no large epidural collection or significant canal narrowing. Disc levels: Disc height loss with uncovertebral osteophytes and disc osteophyte complexes most notable at C5 through C7. Upper chest: The lung apices are clear. Thoracic inlet is within normal limits. Other: None IMPRESSION: 1. No acute intracranial abnormality. 2. Findings consistent with age related atrophy and chronic small vessel ischemia 3. Soft tissue swelling and he hematoma overlying the left frontal skull. 4.  No acute fracture or malalignment of the spine. Electronically Signed   By: Prudencio Pair M.D.   On: 08/02/2019 06:00   CT  Hip Left Wo Contrast  Result Date: 08/02/2019 CLINICAL DATA:  Hip  pain after fall EXAM: CT OF THE LEFT HIP WITHOUT CONTRAST TECHNIQUE: Multidetector CT imaging of the left hip was performed according to the standard protocol. Multiplanar CT image reconstructions were also generated. COMPARISON:  Radiograph same day FINDINGS: Bones/Joint/Cartilage No fracture or dislocation. Moderate left hip osteoarthritis is seen with superior joint space loss and marginal osteophyte formation. There are fragmented enthesophytes seen at the greater trochanter. Enthesophytes seen at the posterior ischial tuberosity. There is diffuse osteopenia. No large hip joint effusion. Ligaments Suboptimally assessed by CT. Muscles and Tendons The muscles surrounding the hip are normal appearance without focal atrophy or tear. Soft tissues Mild soft tissue swelling seen over the left hip. IMPRESSION: No acute osseous abnormality. Moderate left hip osteoarthritis. Insertional gluteal enthesopathy Electronically Signed   By: Prudencio Pair M.D.   On: 08/02/2019 06:03   DG Hip Unilat W or Wo Pelvis 2-3 Views Left  Result Date: 08/02/2019 CLINICAL DATA:  Fall, left hip pain EXAM: DG HIP (WITH OR WITHOUT PELVIS) 2-3V LEFT COMPARISON:  December 30, 2018 FINDINGS: There is no evidence of hip fracture or dislocation. There is diffuse osteopenia. Left hip osteoarthritis is seen with superior joint space loss and large osteophyte formation. Enthesophytes seen at the greater trochanter. IMPRESSION: No acute osseous abnormality. Electronically Signed   By: Prudencio Pair M.D.   On: 08/02/2019 04:32    Procedures Procedures (including critical care time)  Medications Ordered in ED Medications  fentaNYL (SUBLIMAZE) injection 50 mcg (50 mcg Intravenous Given 08/02/19 0445)    ED Course  I have reviewed the triage vital signs and the nursing notes.  Pertinent labs & imaging results that were available during my care of the patient were reviewed by  me and considered in my medical decision making (see chart for details).    MDM Rules/Calculators/A&P                     Patient was given IV fentanyl for pain.  CT of her head and cervical spine and x-rays of her left hip were obtained.  Elected patient's left hip films and CT of her hip was also done.  Patient scans do not show any fracture.  She does have a moderately sized hematoma to her forehead.  Family was given discharge care plan.    Final Clinical Impression(s) / ED Diagnoses Final diagnoses:  Fall at nursing home, initial encounter  Contusion of head, unspecified part of head, initial encounter    Rx / DC Orders ED Discharge Orders    None      Plan discharge  Rolland Porter, MD, Barbette Or, MD 08/02/19 [phone]

## 2019-08-02 NOTE — ED Triage Notes (Signed)
Pt presents from [facility] for evaluation of fall to forehead. EMS reports pt had no LOC and hit head when leaning over too far when using restroom. EMS reports no obvious neck injury.

## 2019-08-02 NOTE — Discharge Instructions (Addendum)
Ice packs to the bruised area.  Your radiology studies tonight show that nothing is broken!  You can take acetaminophen as needed for pain.  Return to the emergency department for any problems listed on the head injury sheet.  9:08 AM As you are being discharged, we saw you had some variability with your heart rate speeds.  The EKG appeared like sinus rhythm with variable rates and did not show convincing evidence of a an arrhythmia such as atrial fibrillation at this time.  Given your lack of palpitations, chest pain, shortness of breath, and otherwise feeling well, we had a shared decision made conversation and agreed to hold on further work-up at this time however I do want you to follow-up with your primary doctor in the next several days as well as likely follow-up with a cardiologist as well.  If you begin feeling any chest pain, palpitations, shortness of breath, lightheadedness, or feel like you are going to pass out, please return to the nearest emergency department immediately.  Please rest and stay hydrated.

## 2019-08-04 DIAGNOSIS — M6281 Muscle weakness (generalized): Secondary | ICD-10-CM | POA: Diagnosis not present

## 2019-08-04 DIAGNOSIS — R2681 Unsteadiness on feet: Secondary | ICD-10-CM | POA: Diagnosis not present

## 2019-08-08 DIAGNOSIS — M6281 Muscle weakness (generalized): Secondary | ICD-10-CM | POA: Diagnosis not present

## 2019-08-08 DIAGNOSIS — R2681 Unsteadiness on feet: Secondary | ICD-10-CM | POA: Diagnosis not present

## 2019-08-09 DIAGNOSIS — M6281 Muscle weakness (generalized): Secondary | ICD-10-CM | POA: Diagnosis not present

## 2019-08-09 DIAGNOSIS — R2681 Unsteadiness on feet: Secondary | ICD-10-CM | POA: Diagnosis not present

## 2019-08-11 DIAGNOSIS — R2681 Unsteadiness on feet: Secondary | ICD-10-CM | POA: Diagnosis not present

## 2019-08-11 DIAGNOSIS — M6281 Muscle weakness (generalized): Secondary | ICD-10-CM | POA: Diagnosis not present

## 2019-08-12 DIAGNOSIS — M6281 Muscle weakness (generalized): Secondary | ICD-10-CM | POA: Diagnosis not present

## 2019-08-12 DIAGNOSIS — R2681 Unsteadiness on feet: Secondary | ICD-10-CM | POA: Diagnosis not present

## 2019-08-15 DIAGNOSIS — R2681 Unsteadiness on feet: Secondary | ICD-10-CM | POA: Diagnosis not present

## 2019-08-15 DIAGNOSIS — M6281 Muscle weakness (generalized): Secondary | ICD-10-CM | POA: Diagnosis not present

## 2019-08-15 DIAGNOSIS — G894 Chronic pain syndrome: Secondary | ICD-10-CM | POA: Diagnosis not present

## 2019-08-16 DIAGNOSIS — F209 Schizophrenia, unspecified: Secondary | ICD-10-CM | POA: Diagnosis not present

## 2019-08-19 DIAGNOSIS — M6281 Muscle weakness (generalized): Secondary | ICD-10-CM | POA: Diagnosis not present

## 2019-08-19 DIAGNOSIS — R2681 Unsteadiness on feet: Secondary | ICD-10-CM | POA: Diagnosis not present

## 2019-08-22 DIAGNOSIS — R2681 Unsteadiness on feet: Secondary | ICD-10-CM | POA: Diagnosis not present

## 2019-08-22 DIAGNOSIS — M6281 Muscle weakness (generalized): Secondary | ICD-10-CM | POA: Diagnosis not present

## 2019-08-22 DIAGNOSIS — R0602 Shortness of breath: Secondary | ICD-10-CM | POA: Diagnosis not present

## 2019-08-23 DIAGNOSIS — R2681 Unsteadiness on feet: Secondary | ICD-10-CM | POA: Diagnosis not present

## 2019-08-23 DIAGNOSIS — F209 Schizophrenia, unspecified: Secondary | ICD-10-CM | POA: Diagnosis not present

## 2019-08-23 DIAGNOSIS — F0391 Unspecified dementia with behavioral disturbance: Secondary | ICD-10-CM | POA: Diagnosis not present

## 2019-08-23 DIAGNOSIS — M6281 Muscle weakness (generalized): Secondary | ICD-10-CM | POA: Diagnosis not present

## 2019-08-24 DIAGNOSIS — Z79899 Other long term (current) drug therapy: Secondary | ICD-10-CM | POA: Diagnosis not present

## 2019-08-25 DIAGNOSIS — R2681 Unsteadiness on feet: Secondary | ICD-10-CM | POA: Diagnosis not present

## 2019-08-25 DIAGNOSIS — M6281 Muscle weakness (generalized): Secondary | ICD-10-CM | POA: Diagnosis not present

## 2019-08-26 DIAGNOSIS — E119 Type 2 diabetes mellitus without complications: Secondary | ICD-10-CM | POA: Diagnosis not present

## 2019-08-26 DIAGNOSIS — E559 Vitamin D deficiency, unspecified: Secondary | ICD-10-CM | POA: Diagnosis not present

## 2019-08-26 DIAGNOSIS — E1010 Type 1 diabetes mellitus with ketoacidosis without coma: Secondary | ICD-10-CM | POA: Diagnosis not present

## 2019-08-26 DIAGNOSIS — D519 Vitamin B12 deficiency anemia, unspecified: Secondary | ICD-10-CM | POA: Diagnosis not present

## 2019-08-29 DIAGNOSIS — R2681 Unsteadiness on feet: Secondary | ICD-10-CM | POA: Diagnosis not present

## 2019-08-29 DIAGNOSIS — M6281 Muscle weakness (generalized): Secondary | ICD-10-CM | POA: Diagnosis not present

## 2019-08-30 DIAGNOSIS — M6281 Muscle weakness (generalized): Secondary | ICD-10-CM | POA: Diagnosis not present

## 2019-08-30 DIAGNOSIS — R2681 Unsteadiness on feet: Secondary | ICD-10-CM | POA: Diagnosis not present

## 2019-09-01 DIAGNOSIS — R2681 Unsteadiness on feet: Secondary | ICD-10-CM | POA: Diagnosis not present

## 2019-09-01 DIAGNOSIS — M6281 Muscle weakness (generalized): Secondary | ICD-10-CM | POA: Diagnosis not present

## 2019-09-05 DIAGNOSIS — M6281 Muscle weakness (generalized): Secondary | ICD-10-CM | POA: Diagnosis not present

## 2019-09-05 DIAGNOSIS — R2681 Unsteadiness on feet: Secondary | ICD-10-CM | POA: Diagnosis not present

## 2019-09-06 DIAGNOSIS — M6281 Muscle weakness (generalized): Secondary | ICD-10-CM | POA: Diagnosis not present

## 2019-09-06 DIAGNOSIS — R2681 Unsteadiness on feet: Secondary | ICD-10-CM | POA: Diagnosis not present

## 2019-09-08 DIAGNOSIS — M6281 Muscle weakness (generalized): Secondary | ICD-10-CM | POA: Diagnosis not present

## 2019-09-08 DIAGNOSIS — R2681 Unsteadiness on feet: Secondary | ICD-10-CM | POA: Diagnosis not present

## 2019-09-12 DIAGNOSIS — R2681 Unsteadiness on feet: Secondary | ICD-10-CM | POA: Diagnosis not present

## 2019-09-12 DIAGNOSIS — M6281 Muscle weakness (generalized): Secondary | ICD-10-CM | POA: Diagnosis not present

## 2019-09-13 DIAGNOSIS — R2681 Unsteadiness on feet: Secondary | ICD-10-CM | POA: Diagnosis not present

## 2019-09-13 DIAGNOSIS — M6281 Muscle weakness (generalized): Secondary | ICD-10-CM | POA: Diagnosis not present

## 2019-09-13 DIAGNOSIS — F209 Schizophrenia, unspecified: Secondary | ICD-10-CM | POA: Diagnosis not present

## 2019-09-14 DIAGNOSIS — R41841 Cognitive communication deficit: Secondary | ICD-10-CM | POA: Diagnosis not present

## 2019-09-15 DIAGNOSIS — R2681 Unsteadiness on feet: Secondary | ICD-10-CM | POA: Diagnosis not present

## 2019-09-15 DIAGNOSIS — M6281 Muscle weakness (generalized): Secondary | ICD-10-CM | POA: Diagnosis not present

## 2019-09-16 DIAGNOSIS — Q845 Enlarged and hypertrophic nails: Secondary | ICD-10-CM | POA: Diagnosis not present

## 2019-09-16 DIAGNOSIS — L84 Corns and callosities: Secondary | ICD-10-CM | POA: Diagnosis not present

## 2019-09-16 DIAGNOSIS — I739 Peripheral vascular disease, unspecified: Secondary | ICD-10-CM | POA: Diagnosis not present

## 2019-09-16 DIAGNOSIS — B351 Tinea unguium: Secondary | ICD-10-CM | POA: Diagnosis not present

## 2019-09-16 DIAGNOSIS — E119 Type 2 diabetes mellitus without complications: Secondary | ICD-10-CM | POA: Diagnosis not present

## 2019-09-19 DIAGNOSIS — I10 Essential (primary) hypertension: Secondary | ICD-10-CM | POA: Diagnosis not present

## 2019-09-19 DIAGNOSIS — K219 Gastro-esophageal reflux disease without esophagitis: Secondary | ICD-10-CM | POA: Diagnosis not present

## 2019-09-19 DIAGNOSIS — M6281 Muscle weakness (generalized): Secondary | ICD-10-CM | POA: Diagnosis not present

## 2019-09-19 DIAGNOSIS — R2681 Unsteadiness on feet: Secondary | ICD-10-CM | POA: Diagnosis not present

## 2019-09-19 DIAGNOSIS — R41841 Cognitive communication deficit: Secondary | ICD-10-CM | POA: Diagnosis not present

## 2019-09-19 DIAGNOSIS — M1991 Primary osteoarthritis, unspecified site: Secondary | ICD-10-CM | POA: Diagnosis not present

## 2019-09-20 DIAGNOSIS — F0391 Unspecified dementia with behavioral disturbance: Secondary | ICD-10-CM | POA: Diagnosis not present

## 2019-09-20 DIAGNOSIS — F209 Schizophrenia, unspecified: Secondary | ICD-10-CM | POA: Diagnosis not present

## 2019-09-21 DIAGNOSIS — M6281 Muscle weakness (generalized): Secondary | ICD-10-CM | POA: Diagnosis not present

## 2019-09-21 DIAGNOSIS — R2681 Unsteadiness on feet: Secondary | ICD-10-CM | POA: Diagnosis not present

## 2019-09-21 DIAGNOSIS — R41841 Cognitive communication deficit: Secondary | ICD-10-CM | POA: Diagnosis not present

## 2019-09-22 DIAGNOSIS — R2681 Unsteadiness on feet: Secondary | ICD-10-CM | POA: Diagnosis not present

## 2019-09-22 DIAGNOSIS — M6281 Muscle weakness (generalized): Secondary | ICD-10-CM | POA: Diagnosis not present

## 2019-09-26 DIAGNOSIS — G894 Chronic pain syndrome: Secondary | ICD-10-CM | POA: Diagnosis not present

## 2019-09-26 DIAGNOSIS — M6281 Muscle weakness (generalized): Secondary | ICD-10-CM | POA: Diagnosis not present

## 2019-09-26 DIAGNOSIS — R2681 Unsteadiness on feet: Secondary | ICD-10-CM | POA: Diagnosis not present

## 2019-09-27 DIAGNOSIS — R41841 Cognitive communication deficit: Secondary | ICD-10-CM | POA: Diagnosis not present

## 2019-09-27 DIAGNOSIS — R2681 Unsteadiness on feet: Secondary | ICD-10-CM | POA: Diagnosis not present

## 2019-09-27 DIAGNOSIS — M6281 Muscle weakness (generalized): Secondary | ICD-10-CM | POA: Diagnosis not present

## 2019-09-29 DIAGNOSIS — R2681 Unsteadiness on feet: Secondary | ICD-10-CM | POA: Diagnosis not present

## 2019-09-29 DIAGNOSIS — M6281 Muscle weakness (generalized): Secondary | ICD-10-CM | POA: Diagnosis not present

## 2019-09-29 DIAGNOSIS — R41841 Cognitive communication deficit: Secondary | ICD-10-CM | POA: Diagnosis not present

## 2019-10-03 DIAGNOSIS — R41841 Cognitive communication deficit: Secondary | ICD-10-CM | POA: Diagnosis not present

## 2019-10-03 DIAGNOSIS — M6281 Muscle weakness (generalized): Secondary | ICD-10-CM | POA: Diagnosis not present

## 2019-10-10 DIAGNOSIS — M1991 Primary osteoarthritis, unspecified site: Secondary | ICD-10-CM | POA: Diagnosis not present

## 2019-10-17 DIAGNOSIS — E119 Type 2 diabetes mellitus without complications: Secondary | ICD-10-CM | POA: Diagnosis not present

## 2019-10-17 DIAGNOSIS — K5904 Chronic idiopathic constipation: Secondary | ICD-10-CM | POA: Diagnosis not present

## 2019-10-17 DIAGNOSIS — E039 Hypothyroidism, unspecified: Secondary | ICD-10-CM | POA: Diagnosis not present

## 2019-10-24 DIAGNOSIS — G894 Chronic pain syndrome: Secondary | ICD-10-CM | POA: Diagnosis not present

## 2019-10-27 DIAGNOSIS — Z79899 Other long term (current) drug therapy: Secondary | ICD-10-CM | POA: Diagnosis not present

## 2019-11-29 DIAGNOSIS — F0150 Vascular dementia without behavioral disturbance: Secondary | ICD-10-CM | POA: Diagnosis not present

## 2019-11-29 DIAGNOSIS — F209 Schizophrenia, unspecified: Secondary | ICD-10-CM | POA: Diagnosis not present

## 2019-12-05 DIAGNOSIS — E119 Type 2 diabetes mellitus without complications: Secondary | ICD-10-CM | POA: Diagnosis not present

## 2019-12-05 DIAGNOSIS — K219 Gastro-esophageal reflux disease without esophagitis: Secondary | ICD-10-CM | POA: Diagnosis not present

## 2019-12-05 DIAGNOSIS — N3281 Overactive bladder: Secondary | ICD-10-CM | POA: Diagnosis not present

## 2019-12-05 DIAGNOSIS — I10 Essential (primary) hypertension: Secondary | ICD-10-CM | POA: Diagnosis not present

## 2019-12-06 DIAGNOSIS — M170 Bilateral primary osteoarthritis of knee: Secondary | ICD-10-CM | POA: Diagnosis not present

## 2019-12-06 DIAGNOSIS — M25562 Pain in left knee: Secondary | ICD-10-CM | POA: Diagnosis not present

## 2019-12-06 DIAGNOSIS — M25561 Pain in right knee: Secondary | ICD-10-CM | POA: Diagnosis not present

## 2019-12-13 DIAGNOSIS — M25562 Pain in left knee: Secondary | ICD-10-CM | POA: Diagnosis not present

## 2019-12-13 DIAGNOSIS — M170 Bilateral primary osteoarthritis of knee: Secondary | ICD-10-CM | POA: Diagnosis not present

## 2019-12-13 DIAGNOSIS — M25561 Pain in right knee: Secondary | ICD-10-CM | POA: Diagnosis not present

## 2019-12-15 DIAGNOSIS — E113293 Type 2 diabetes mellitus with mild nonproliferative diabetic retinopathy without macular edema, bilateral: Secondary | ICD-10-CM | POA: Diagnosis not present

## 2019-12-15 DIAGNOSIS — H35033 Hypertensive retinopathy, bilateral: Secondary | ICD-10-CM | POA: Diagnosis not present

## 2019-12-15 DIAGNOSIS — H40013 Open angle with borderline findings, low risk, bilateral: Secondary | ICD-10-CM | POA: Diagnosis not present

## 2019-12-15 DIAGNOSIS — H26492 Other secondary cataract, left eye: Secondary | ICD-10-CM | POA: Diagnosis not present

## 2019-12-15 DIAGNOSIS — F209 Schizophrenia, unspecified: Secondary | ICD-10-CM | POA: Diagnosis not present

## 2019-12-19 DIAGNOSIS — G894 Chronic pain syndrome: Secondary | ICD-10-CM | POA: Diagnosis not present

## 2019-12-20 DIAGNOSIS — M25561 Pain in right knee: Secondary | ICD-10-CM | POA: Diagnosis not present

## 2019-12-20 DIAGNOSIS — M170 Bilateral primary osteoarthritis of knee: Secondary | ICD-10-CM | POA: Diagnosis not present

## 2019-12-20 DIAGNOSIS — M25562 Pain in left knee: Secondary | ICD-10-CM | POA: Diagnosis not present

## 2019-12-26 DIAGNOSIS — E559 Vitamin D deficiency, unspecified: Secondary | ICD-10-CM | POA: Diagnosis not present

## 2019-12-26 DIAGNOSIS — E039 Hypothyroidism, unspecified: Secondary | ICD-10-CM | POA: Diagnosis not present

## 2019-12-26 DIAGNOSIS — E119 Type 2 diabetes mellitus without complications: Secondary | ICD-10-CM | POA: Diagnosis not present

## 2019-12-27 DIAGNOSIS — F209 Schizophrenia, unspecified: Secondary | ICD-10-CM | POA: Diagnosis not present

## 2019-12-27 DIAGNOSIS — F0150 Vascular dementia without behavioral disturbance: Secondary | ICD-10-CM | POA: Diagnosis not present

## 2019-12-29 DIAGNOSIS — M6281 Muscle weakness (generalized): Secondary | ICD-10-CM | POA: Diagnosis not present

## 2019-12-29 DIAGNOSIS — R2681 Unsteadiness on feet: Secondary | ICD-10-CM | POA: Diagnosis not present

## 2019-12-30 DIAGNOSIS — R2681 Unsteadiness on feet: Secondary | ICD-10-CM | POA: Diagnosis not present

## 2019-12-30 DIAGNOSIS — M6281 Muscle weakness (generalized): Secondary | ICD-10-CM | POA: Diagnosis not present

## 2020-01-02 DIAGNOSIS — M6281 Muscle weakness (generalized): Secondary | ICD-10-CM | POA: Diagnosis not present

## 2020-01-02 DIAGNOSIS — R2681 Unsteadiness on feet: Secondary | ICD-10-CM | POA: Diagnosis not present

## 2020-01-03 DIAGNOSIS — M6281 Muscle weakness (generalized): Secondary | ICD-10-CM | POA: Diagnosis not present

## 2020-01-03 DIAGNOSIS — R2681 Unsteadiness on feet: Secondary | ICD-10-CM | POA: Diagnosis not present

## 2020-01-04 DIAGNOSIS — Z79899 Other long term (current) drug therapy: Secondary | ICD-10-CM | POA: Diagnosis not present

## 2020-01-04 DIAGNOSIS — Z79891 Long term (current) use of opiate analgesic: Secondary | ICD-10-CM | POA: Diagnosis not present

## 2020-01-04 DIAGNOSIS — R2681 Unsteadiness on feet: Secondary | ICD-10-CM | POA: Diagnosis not present

## 2020-01-04 DIAGNOSIS — Z9229 Personal history of other drug therapy: Secondary | ICD-10-CM | POA: Diagnosis not present

## 2020-01-04 DIAGNOSIS — M6281 Muscle weakness (generalized): Secondary | ICD-10-CM | POA: Diagnosis not present

## 2020-01-05 DIAGNOSIS — R2681 Unsteadiness on feet: Secondary | ICD-10-CM | POA: Diagnosis not present

## 2020-01-05 DIAGNOSIS — M6281 Muscle weakness (generalized): Secondary | ICD-10-CM | POA: Diagnosis not present

## 2020-01-06 DIAGNOSIS — M6281 Muscle weakness (generalized): Secondary | ICD-10-CM | POA: Diagnosis not present

## 2020-01-06 DIAGNOSIS — R2681 Unsteadiness on feet: Secondary | ICD-10-CM | POA: Diagnosis not present

## 2020-01-09 DIAGNOSIS — R2681 Unsteadiness on feet: Secondary | ICD-10-CM | POA: Diagnosis not present

## 2020-01-09 DIAGNOSIS — M6281 Muscle weakness (generalized): Secondary | ICD-10-CM | POA: Diagnosis not present

## 2020-01-10 DIAGNOSIS — M6281 Muscle weakness (generalized): Secondary | ICD-10-CM | POA: Diagnosis not present

## 2020-01-10 DIAGNOSIS — R2681 Unsteadiness on feet: Secondary | ICD-10-CM | POA: Diagnosis not present

## 2020-01-11 DIAGNOSIS — R2681 Unsteadiness on feet: Secondary | ICD-10-CM | POA: Diagnosis not present

## 2020-01-11 DIAGNOSIS — M6281 Muscle weakness (generalized): Secondary | ICD-10-CM | POA: Diagnosis not present

## 2020-01-12 DIAGNOSIS — R2681 Unsteadiness on feet: Secondary | ICD-10-CM | POA: Diagnosis not present

## 2020-01-12 DIAGNOSIS — M6281 Muscle weakness (generalized): Secondary | ICD-10-CM | POA: Diagnosis not present

## 2020-01-13 DIAGNOSIS — M6281 Muscle weakness (generalized): Secondary | ICD-10-CM | POA: Diagnosis not present

## 2020-01-13 DIAGNOSIS — R2681 Unsteadiness on feet: Secondary | ICD-10-CM | POA: Diagnosis not present

## 2020-01-16 DIAGNOSIS — M6281 Muscle weakness (generalized): Secondary | ICD-10-CM | POA: Diagnosis not present

## 2020-01-16 DIAGNOSIS — G894 Chronic pain syndrome: Secondary | ICD-10-CM | POA: Diagnosis not present

## 2020-01-16 DIAGNOSIS — R2681 Unsteadiness on feet: Secondary | ICD-10-CM | POA: Diagnosis not present

## 2020-01-17 DIAGNOSIS — M1711 Unilateral primary osteoarthritis, right knee: Secondary | ICD-10-CM | POA: Diagnosis not present

## 2020-01-17 DIAGNOSIS — M25562 Pain in left knee: Secondary | ICD-10-CM | POA: Diagnosis not present

## 2020-01-17 DIAGNOSIS — M170 Bilateral primary osteoarthritis of knee: Secondary | ICD-10-CM | POA: Diagnosis not present

## 2020-01-17 DIAGNOSIS — M19011 Primary osteoarthritis, right shoulder: Secondary | ICD-10-CM | POA: Diagnosis not present

## 2020-01-17 DIAGNOSIS — M25511 Pain in right shoulder: Secondary | ICD-10-CM | POA: Diagnosis not present

## 2020-01-17 DIAGNOSIS — R2681 Unsteadiness on feet: Secondary | ICD-10-CM | POA: Diagnosis not present

## 2020-01-17 DIAGNOSIS — M1712 Unilateral primary osteoarthritis, left knee: Secondary | ICD-10-CM | POA: Diagnosis not present

## 2020-01-17 DIAGNOSIS — M6281 Muscle weakness (generalized): Secondary | ICD-10-CM | POA: Diagnosis not present

## 2020-01-18 DIAGNOSIS — R2681 Unsteadiness on feet: Secondary | ICD-10-CM | POA: Diagnosis not present

## 2020-01-18 DIAGNOSIS — M6281 Muscle weakness (generalized): Secondary | ICD-10-CM | POA: Diagnosis not present

## 2020-01-19 DIAGNOSIS — R2681 Unsteadiness on feet: Secondary | ICD-10-CM | POA: Diagnosis not present

## 2020-01-19 DIAGNOSIS — M6281 Muscle weakness (generalized): Secondary | ICD-10-CM | POA: Diagnosis not present

## 2020-01-20 DIAGNOSIS — R2681 Unsteadiness on feet: Secondary | ICD-10-CM | POA: Diagnosis not present

## 2020-01-20 DIAGNOSIS — M6281 Muscle weakness (generalized): Secondary | ICD-10-CM | POA: Diagnosis not present

## 2020-01-21 DIAGNOSIS — M6281 Muscle weakness (generalized): Secondary | ICD-10-CM | POA: Diagnosis not present

## 2020-01-21 DIAGNOSIS — R2681 Unsteadiness on feet: Secondary | ICD-10-CM | POA: Diagnosis not present

## 2020-01-23 DIAGNOSIS — K5904 Chronic idiopathic constipation: Secondary | ICD-10-CM | POA: Diagnosis not present

## 2020-01-23 DIAGNOSIS — K219 Gastro-esophageal reflux disease without esophagitis: Secondary | ICD-10-CM | POA: Diagnosis not present

## 2020-01-23 DIAGNOSIS — E039 Hypothyroidism, unspecified: Secondary | ICD-10-CM | POA: Diagnosis not present

## 2020-01-24 DIAGNOSIS — F200 Paranoid schizophrenia: Secondary | ICD-10-CM | POA: Diagnosis not present

## 2020-01-24 DIAGNOSIS — R2681 Unsteadiness on feet: Secondary | ICD-10-CM | POA: Diagnosis not present

## 2020-01-24 DIAGNOSIS — M6281 Muscle weakness (generalized): Secondary | ICD-10-CM | POA: Diagnosis not present

## 2020-01-24 DIAGNOSIS — F0150 Vascular dementia without behavioral disturbance: Secondary | ICD-10-CM | POA: Diagnosis not present

## 2020-01-25 DIAGNOSIS — M6281 Muscle weakness (generalized): Secondary | ICD-10-CM | POA: Diagnosis not present

## 2020-01-25 DIAGNOSIS — R2681 Unsteadiness on feet: Secondary | ICD-10-CM | POA: Diagnosis not present

## 2020-01-26 DIAGNOSIS — M6281 Muscle weakness (generalized): Secondary | ICD-10-CM | POA: Diagnosis not present

## 2020-01-26 DIAGNOSIS — R2681 Unsteadiness on feet: Secondary | ICD-10-CM | POA: Diagnosis not present

## 2020-01-27 DIAGNOSIS — R2681 Unsteadiness on feet: Secondary | ICD-10-CM | POA: Diagnosis not present

## 2020-01-27 DIAGNOSIS — M6281 Muscle weakness (generalized): Secondary | ICD-10-CM | POA: Diagnosis not present

## 2020-01-30 DIAGNOSIS — M6281 Muscle weakness (generalized): Secondary | ICD-10-CM | POA: Diagnosis not present

## 2020-01-30 DIAGNOSIS — R2681 Unsteadiness on feet: Secondary | ICD-10-CM | POA: Diagnosis not present

## 2020-01-31 DIAGNOSIS — M6281 Muscle weakness (generalized): Secondary | ICD-10-CM | POA: Diagnosis not present

## 2020-01-31 DIAGNOSIS — R2681 Unsteadiness on feet: Secondary | ICD-10-CM | POA: Diagnosis not present

## 2020-02-01 DIAGNOSIS — F200 Paranoid schizophrenia: Secondary | ICD-10-CM | POA: Diagnosis not present

## 2020-02-13 DIAGNOSIS — G894 Chronic pain syndrome: Secondary | ICD-10-CM | POA: Diagnosis not present

## 2020-02-15 DIAGNOSIS — F200 Paranoid schizophrenia: Secondary | ICD-10-CM | POA: Diagnosis not present

## 2020-02-20 DIAGNOSIS — E119 Type 2 diabetes mellitus without complications: Secondary | ICD-10-CM | POA: Diagnosis not present

## 2020-02-20 DIAGNOSIS — K5904 Chronic idiopathic constipation: Secondary | ICD-10-CM | POA: Diagnosis not present

## 2020-02-20 DIAGNOSIS — I10 Essential (primary) hypertension: Secondary | ICD-10-CM | POA: Diagnosis not present

## 2020-02-21 DIAGNOSIS — F200 Paranoid schizophrenia: Secondary | ICD-10-CM | POA: Diagnosis not present

## 2020-02-21 DIAGNOSIS — F0150 Vascular dementia without behavioral disturbance: Secondary | ICD-10-CM | POA: Diagnosis not present

## 2020-02-21 DIAGNOSIS — G894 Chronic pain syndrome: Secondary | ICD-10-CM | POA: Diagnosis not present

## 2020-02-23 DIAGNOSIS — Z23 Encounter for immunization: Secondary | ICD-10-CM | POA: Diagnosis not present

## 2020-02-29 DIAGNOSIS — F200 Paranoid schizophrenia: Secondary | ICD-10-CM | POA: Diagnosis not present

## 2020-03-06 DIAGNOSIS — F0150 Vascular dementia without behavioral disturbance: Secondary | ICD-10-CM | POA: Diagnosis not present

## 2020-03-06 DIAGNOSIS — G894 Chronic pain syndrome: Secondary | ICD-10-CM | POA: Diagnosis not present

## 2020-03-06 DIAGNOSIS — F200 Paranoid schizophrenia: Secondary | ICD-10-CM | POA: Diagnosis not present

## 2020-03-10 IMAGING — CR DG CHEST 2V
2 series · 2 of 2 positions shown · non-contrast
Comparison: Radiographs November 12, 2017.

CLINICAL DATA: Transfer to another facility.

EXAM:
CHEST - 2 VIEW

[w chest lat]
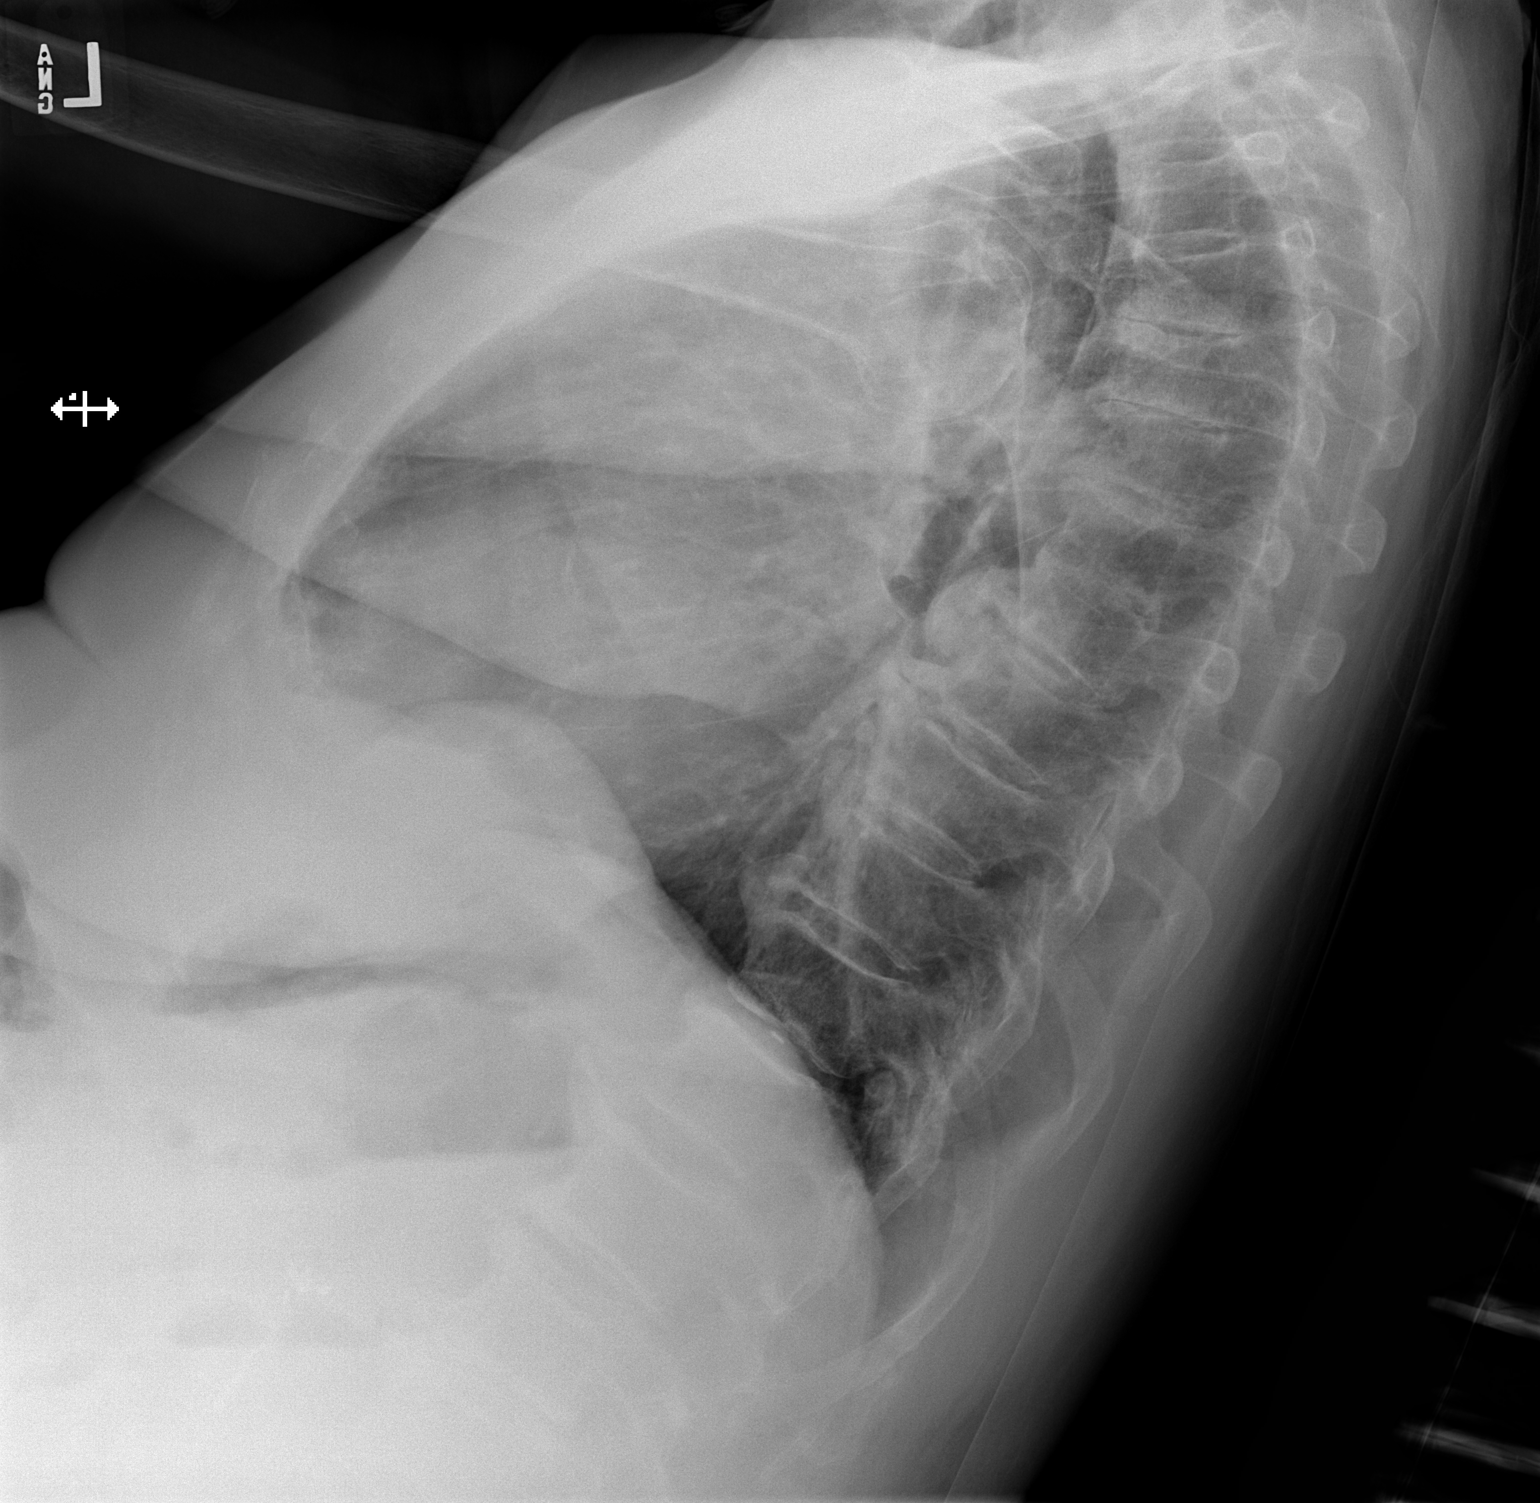

[x chest ap]
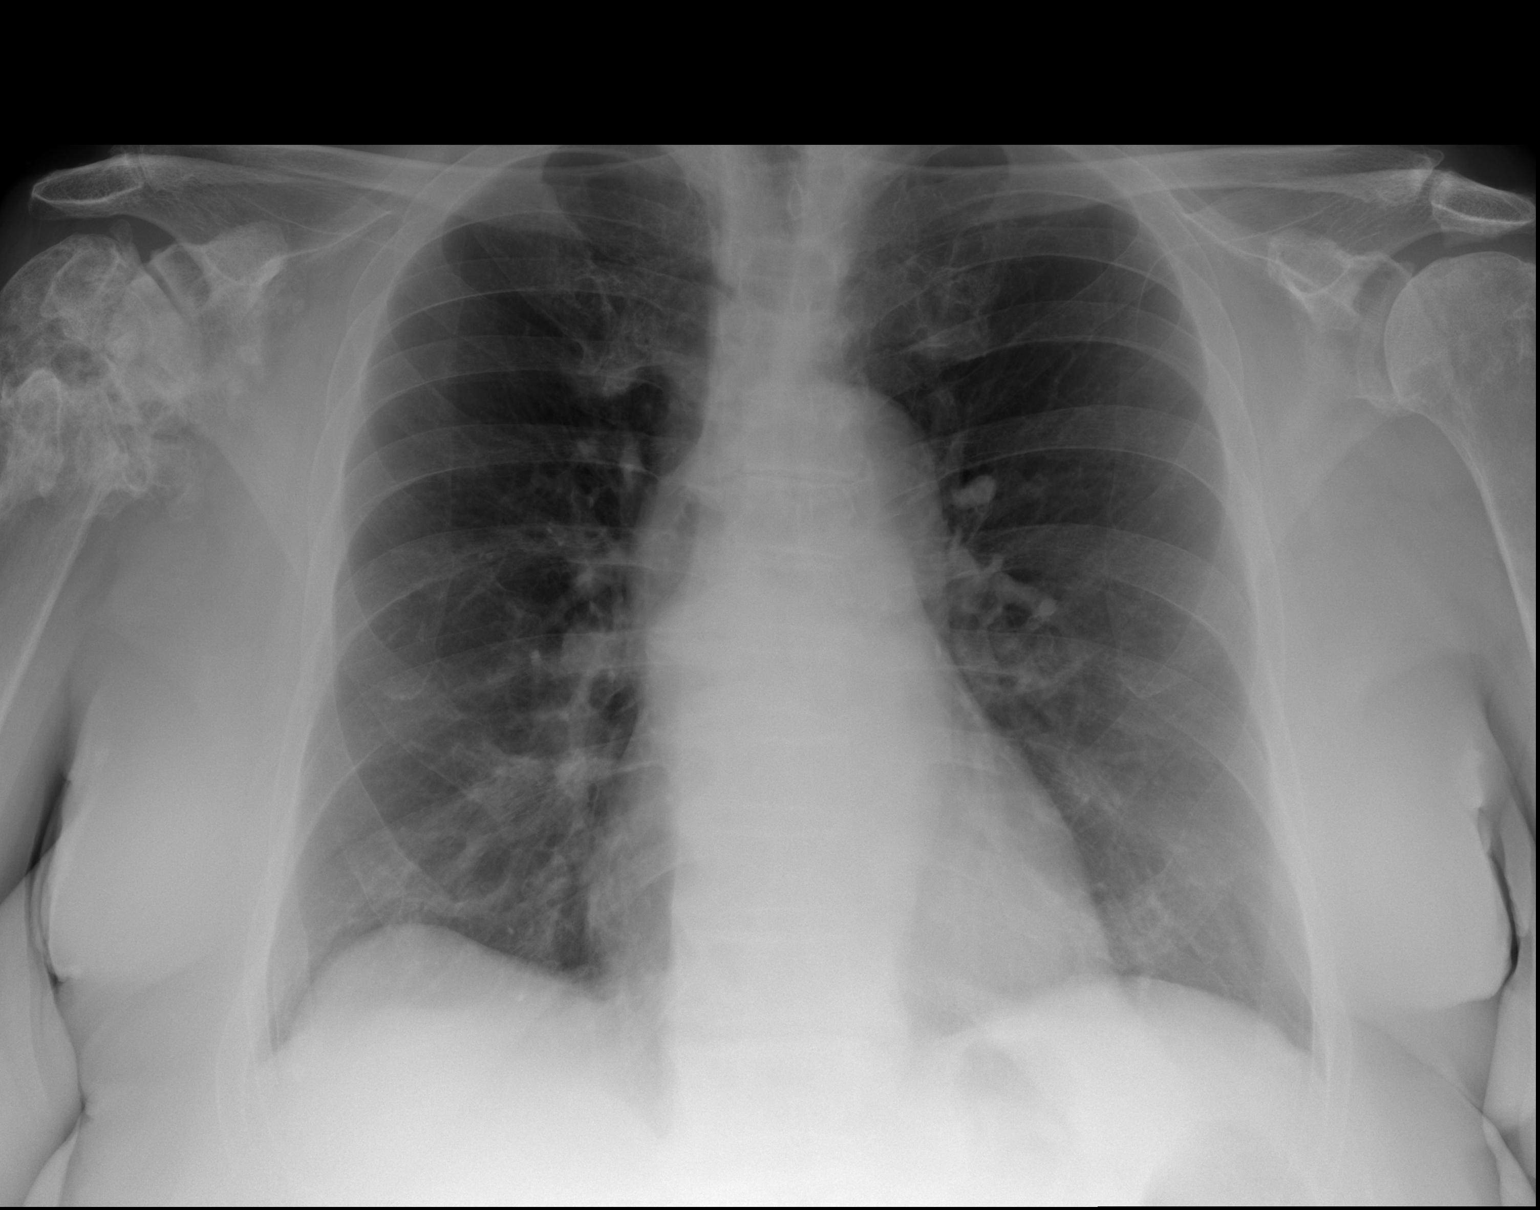

[2 of 2 positions shown; findings below may reference images not displayed]

FINDINGS: The heart size and mediastinal contours are within normal limits.
Both lungs are clear. Stable posttraumatic and degenerative changes
are seen involving the proximal right humerus.
IMPRESSION: No active cardiopulmonary disease.

## 2020-03-12 DIAGNOSIS — L03115 Cellulitis of right lower limb: Secondary | ICD-10-CM | POA: Diagnosis not present

## 2020-03-12 DIAGNOSIS — G894 Chronic pain syndrome: Secondary | ICD-10-CM | POA: Diagnosis not present

## 2020-03-13 DIAGNOSIS — F200 Paranoid schizophrenia: Secondary | ICD-10-CM | POA: Diagnosis not present

## 2020-03-19 DIAGNOSIS — K219 Gastro-esophageal reflux disease without esophagitis: Secondary | ICD-10-CM | POA: Diagnosis not present

## 2020-03-19 DIAGNOSIS — L03115 Cellulitis of right lower limb: Secondary | ICD-10-CM | POA: Diagnosis not present

## 2020-03-19 DIAGNOSIS — M62838 Other muscle spasm: Secondary | ICD-10-CM | POA: Diagnosis not present

## 2020-03-19 DIAGNOSIS — E039 Hypothyroidism, unspecified: Secondary | ICD-10-CM | POA: Diagnosis not present

## 2020-03-19 DIAGNOSIS — E119 Type 2 diabetes mellitus without complications: Secondary | ICD-10-CM | POA: Diagnosis not present

## 2020-03-20 DIAGNOSIS — F0150 Vascular dementia without behavioral disturbance: Secondary | ICD-10-CM | POA: Diagnosis not present

## 2020-03-20 DIAGNOSIS — F200 Paranoid schizophrenia: Secondary | ICD-10-CM | POA: Diagnosis not present

## 2020-03-22 DIAGNOSIS — Z23 Encounter for immunization: Secondary | ICD-10-CM | POA: Diagnosis not present

## 2020-03-26 ENCOUNTER — Other Ambulatory Visit: Payer: Self-pay | Admitting: Family Medicine

## 2020-03-29 DIAGNOSIS — Z79899 Other long term (current) drug therapy: Secondary | ICD-10-CM | POA: Diagnosis not present

## 2020-04-10 DIAGNOSIS — F200 Paranoid schizophrenia: Secondary | ICD-10-CM | POA: Diagnosis not present

## 2020-04-17 DIAGNOSIS — F200 Paranoid schizophrenia: Secondary | ICD-10-CM | POA: Diagnosis not present

## 2020-04-17 DIAGNOSIS — F0150 Vascular dementia without behavioral disturbance: Secondary | ICD-10-CM | POA: Diagnosis not present

## 2020-04-23 DIAGNOSIS — K5904 Chronic idiopathic constipation: Secondary | ICD-10-CM | POA: Diagnosis not present

## 2020-04-23 DIAGNOSIS — G894 Chronic pain syndrome: Secondary | ICD-10-CM | POA: Diagnosis not present

## 2020-04-24 DIAGNOSIS — F200 Paranoid schizophrenia: Secondary | ICD-10-CM | POA: Diagnosis not present

## 2020-04-30 DIAGNOSIS — M1991 Primary osteoarthritis, unspecified site: Secondary | ICD-10-CM | POA: Diagnosis not present

## 2020-04-30 DIAGNOSIS — I10 Essential (primary) hypertension: Secondary | ICD-10-CM | POA: Diagnosis not present

## 2020-04-30 DIAGNOSIS — E118 Type 2 diabetes mellitus with unspecified complications: Secondary | ICD-10-CM | POA: Diagnosis not present

## 2020-04-30 DIAGNOSIS — E559 Vitamin D deficiency, unspecified: Secondary | ICD-10-CM | POA: Diagnosis not present

## 2020-05-03 DIAGNOSIS — F200 Paranoid schizophrenia: Secondary | ICD-10-CM | POA: Diagnosis not present

## 2020-09-15 IMAGING — CT CT CERVICAL SPINE W/O CM
3 of 4 series · 11 of 33 positions shown, 13 images · non-contrast
Comparison: November 12, 2017

CLINICAL DATA: Fall hit forehead

EXAM:
CT HEAD WITHOUT CONTRAST
TECHNIQUE: Contiguous axial images were obtained from the base of the skull
through the vertex without intravenous contrast.

[Series 5: sagittal bone · sagittal · 0.22mm/px · 5 of 61 slices shown, 6 images]
[im 21/61  bone]
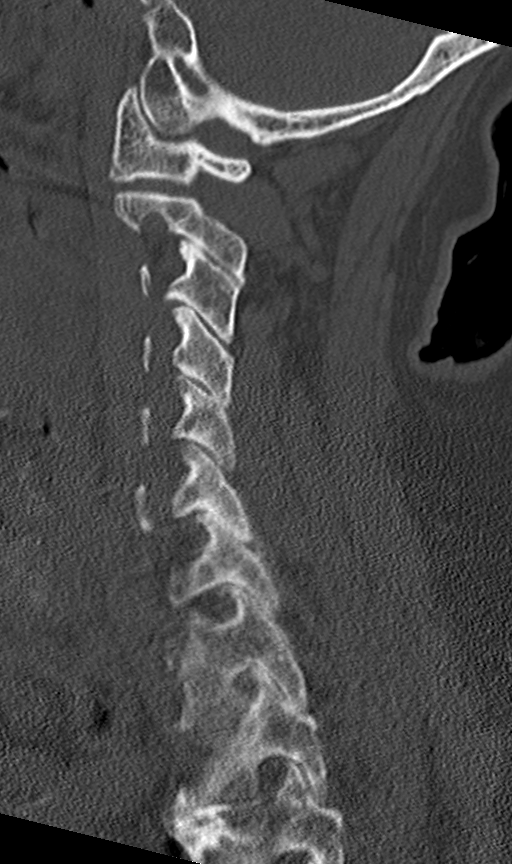
[im 26/61  bone]
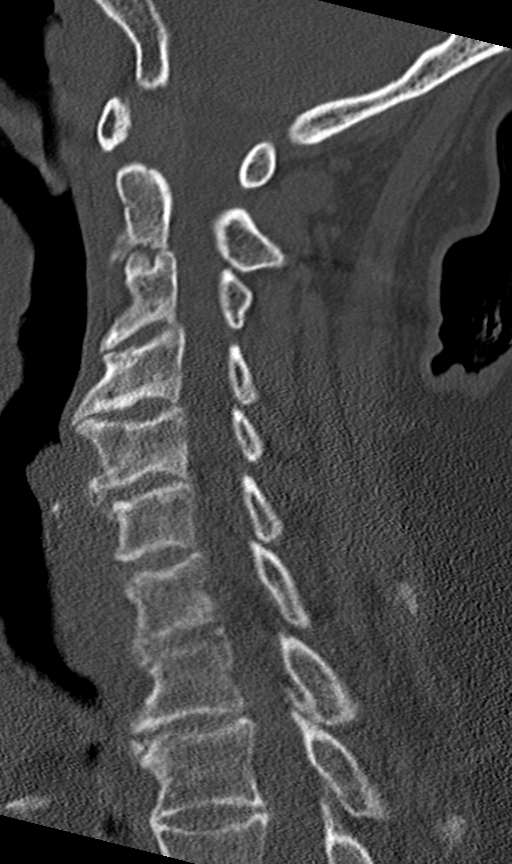
[im 31/61  soft-tissue]
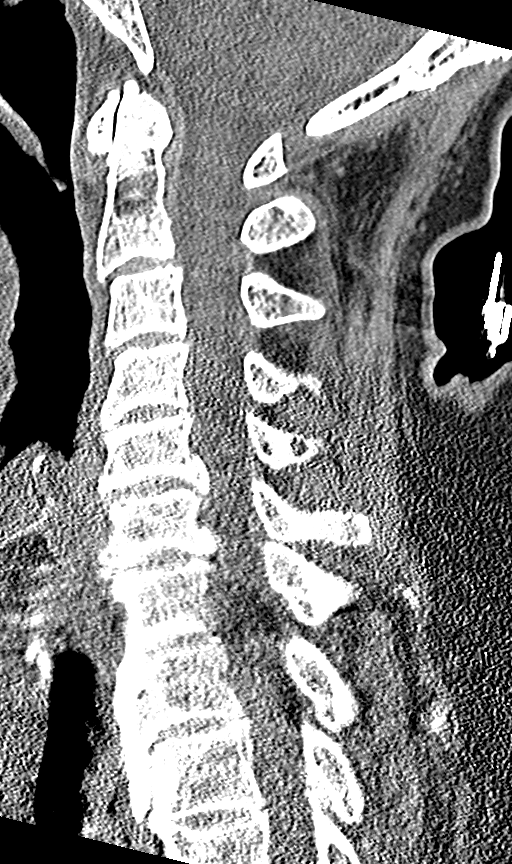
[im 31/61  bone]
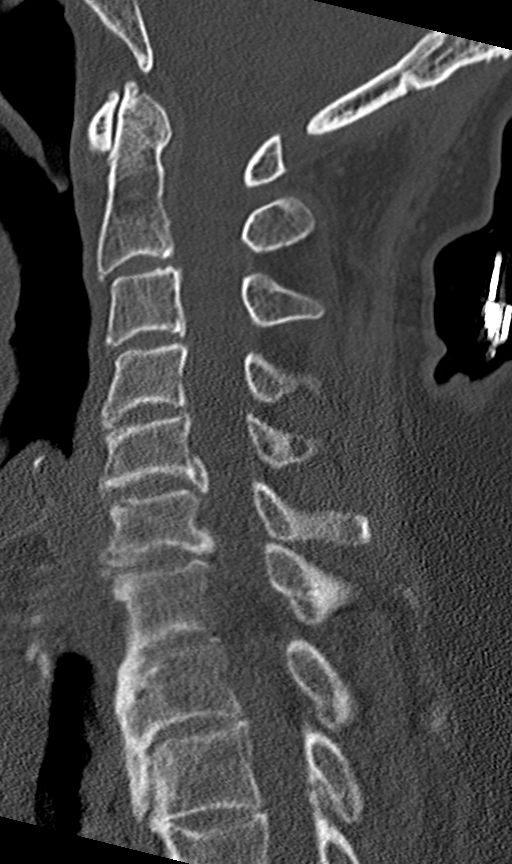
[im 36/61  bone]
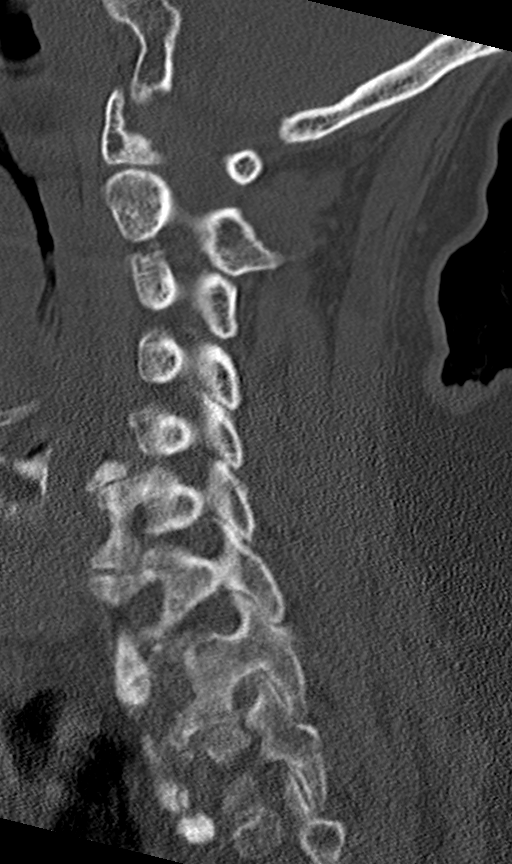
[im 41/61  bone]
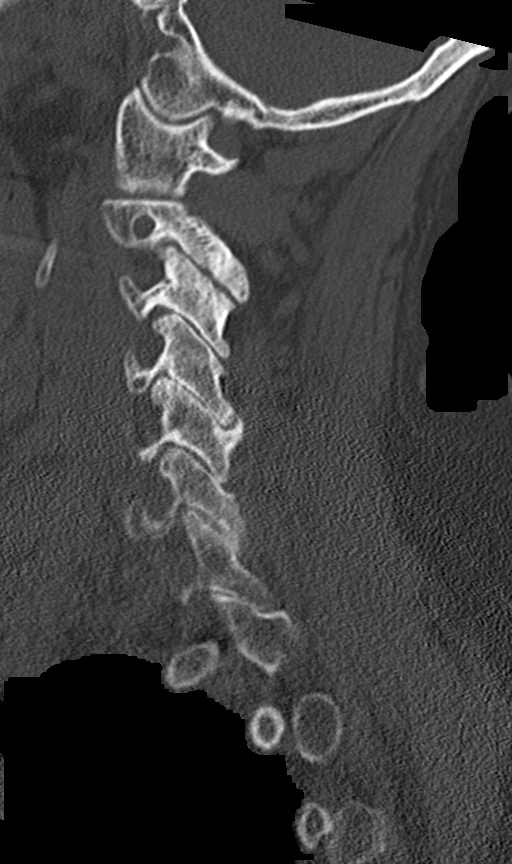

[Series 6: coronal bone · coronal · 0.24mm/px · 3 of 48 slices shown]
[im 10/48  bone]
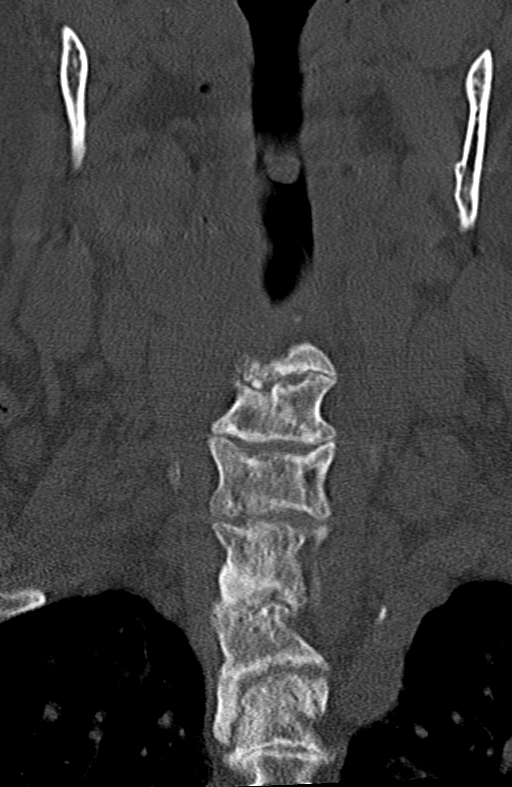
[im 19/48  bone]
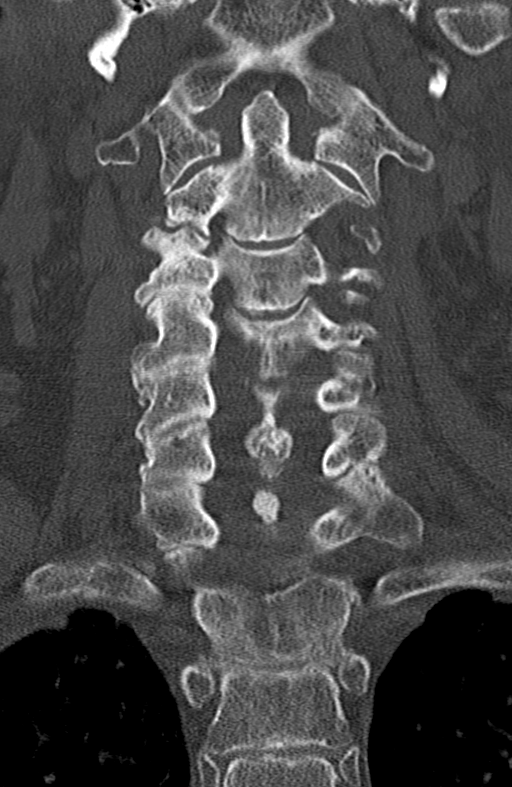
[im 29/48  bone]
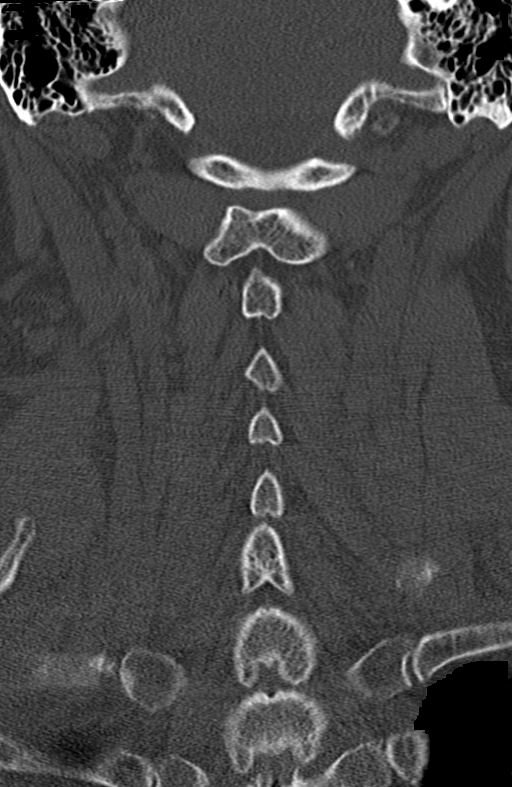

[Series 7: orthogonal bone · axial · 0.19mm/px · z∈[-246,-129]mm · 3 of 94 slices shown, 4 images]
[im 16/94  soft-tissue]
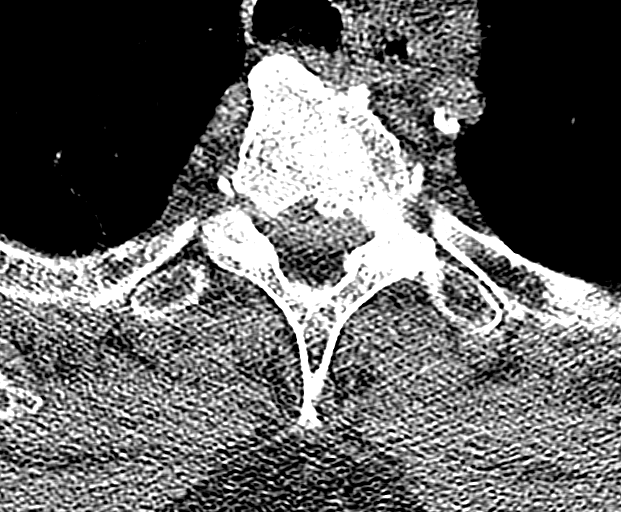
[im 16/94  bone]
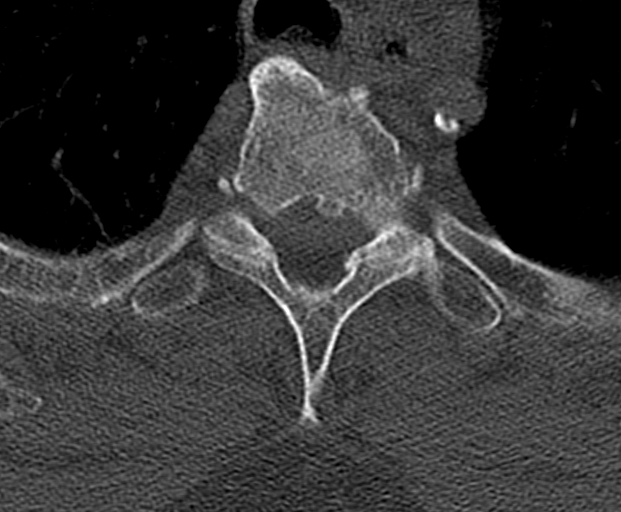
[im 47/94  bone]
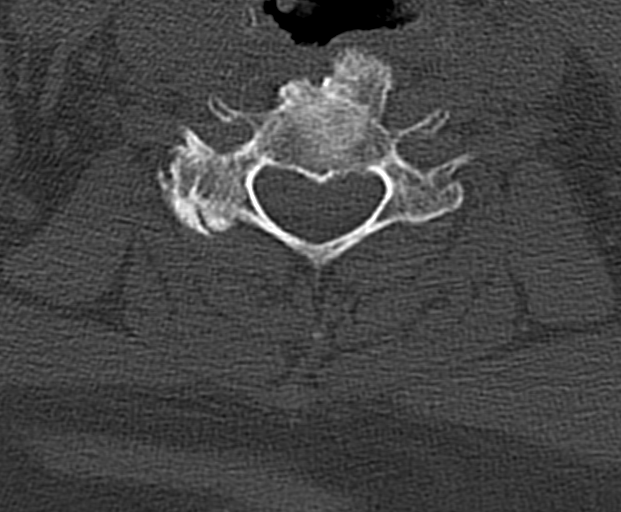
[im 78/94  bone]
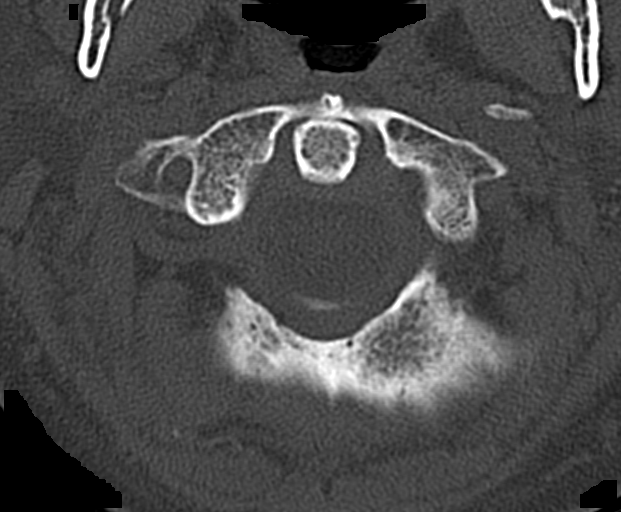

[11 of 33 positions shown; findings below may reference images not displayed]

FINDINGS: Brain: No evidence of acute territorial infarction, hemorrhage,
hydrocephalus,extra-axial collection or mass lesion/mass effect.
There is dilatation the ventricles and sulci consistent with
age-related atrophy. Low-attenuation changes in the deep white
matter consistent with small vessel ischemia.

Vascular: No hyperdense vessel or unexpected calcification.

Skull: The skull is intact. No fracture or focal lesion identified.

Sinuses/Orbits: The visualized paranasal sinuses and mastoid air
cells are clear. The orbits and globes intact.

Other: Soft tissue swelling with a small hematoma seen overlying the
left frontal skull.

Cervical spine:

Alignment: There is straightening of the normal cervical lordosis.

Skull base and vertebrae: Visualized skull base is intact. No
atlanto-occipital dissociation. The vertebral body heights are well
maintained. No fracture or pathologic osseous lesion seen.

Soft tissues and spinal canal: The visualized paraspinal soft
tissues are unremarkable. No prevertebral soft tissue swelling is
seen. The spinal canal is grossly unremarkable, no large epidural
collection or significant canal narrowing.

Disc levels: Disc height loss with uncovertebral osteophytes and
disc osteophyte complexes most notable at C5 through C7.

Upper chest: The lung apices are clear. Thoracic inlet is within
normal limits.

Other: None
IMPRESSION: 1. No acute intracranial abnormality.
2. Findings consistent with age related atrophy and chronic small
vessel ischemia
3. Soft tissue swelling and he hematoma overlying the left frontal
skull.
4.  No acute fracture or malalignment of the spine.

## 2022-08-15 ENCOUNTER — Ambulatory Visit: Payer: Medicare Other | Admitting: Physician Assistant

## 2022-08-27 ENCOUNTER — Other Ambulatory Visit (INDEPENDENT_AMBULATORY_CARE_PROVIDER_SITE_OTHER): Payer: Medicare Other

## 2022-08-27 ENCOUNTER — Encounter: Payer: Self-pay | Admitting: Physician Assistant

## 2022-08-27 ENCOUNTER — Ambulatory Visit (INDEPENDENT_AMBULATORY_CARE_PROVIDER_SITE_OTHER): Payer: Medicare Other | Admitting: Physician Assistant

## 2022-08-27 ENCOUNTER — Other Ambulatory Visit: Payer: Self-pay

## 2022-08-27 DIAGNOSIS — M25511 Pain in right shoulder: Secondary | ICD-10-CM | POA: Diagnosis not present

## 2022-08-27 DIAGNOSIS — M25512 Pain in left shoulder: Secondary | ICD-10-CM

## 2022-08-27 DIAGNOSIS — M19012 Primary osteoarthritis, left shoulder: Secondary | ICD-10-CM

## 2022-08-27 DIAGNOSIS — G8929 Other chronic pain: Secondary | ICD-10-CM

## 2022-08-27 DIAGNOSIS — M19211 Secondary osteoarthritis, right shoulder: Secondary | ICD-10-CM

## 2022-08-27 DIAGNOSIS — M19212 Secondary osteoarthritis, left shoulder: Secondary | ICD-10-CM

## 2022-08-27 NOTE — Progress Notes (Signed)
   Procedure Note  Patient: Bonnie Watkins             Date of Birth: December 07, 1934           MRN: [id number]             Visit Date: 08/27/2022  Procedures: Visit Diagnoses:  1. Glenohumeral arthritis, left   2. Chronic pain of both shoulders   3. Secondary osteoarthritis of shoulders, bilateral     Large Joint Inj: L glenohumeral on 08/27/2022 4:33 PM Indications: diagnostic evaluation and pain Details: 22 G 1.5 in and 3.5 in needle, ultrasound-guided posterior approach  Arthrogram: No  Medications: 9 mL bupivacaine 0.5 %; 40 mg methylPREDNISolone acetate 40 MG/ML; 5 mL lidocaine 1 % Outcome: tolerated well, no immediate complications Procedure, treatment alternatives, risks and benefits explained, specific risks discussed. Consent was given by the patient. Immediately prior to procedure a time out was called to verify the correct patient, procedure, equipment, support staff and site/side marked as required. Patient was prepped and draped in the usual sterile fashion.

## 2022-08-27 NOTE — Progress Notes (Unsigned)
Office Visit Note   Patient: Bonnie Watkins           Date of Birth: Oct 31, 1934           MRN: [id number] Visit Date: 08/27/2022              Requested by: No referring provider defined for this encounter. PCP: No primary care provider on file.   Assessment & Plan: Visit Diagnoses:  1. Chronic pain of both shoulders     Plan: Bonnie Watkins is a pleasant 88 year old woman that is a resident of [facility].  She is accompanied here today by her daughter.  She is complaining of left greater than right shoulder pain.  She does have a history of a right shoulder fracture no previous history on the left shoulder.  She has not had any surgery.  She is ambidextrous.  X-rays confirm previous right humeral head fracture and severe changes in both shoulders.  Her motion on the left shoulder is better than what would expect she has a baseline tremor from one of her medications.  She can actively elevate her arm to about 95 degrees passively we can get her to almost 100.  Slightly less on the right side.  She is got good grip strength good sensation and a strong radial pulse.  Consulted Luke who does ultrasound-guided injections and will go forward with trying an ultrasound-guided injection on the left if she gets good results could consider same on the right in a couple weeks as she is a diabetic.  I will also order some physical therapy to try to optimize her range of motion and functionality.  Follow-Up Instructions: No follow-ups on file.   Orders:  Orders Placed This Encounter  Procedures   XR Shoulder Left   XR Shoulder Right   No orders of the defined types were placed in this encounter.     Procedures: No procedures performed   Clinical Data: No additional findings.   Subjective: Chief Complaint  Patient presents with   Left Shoulder - Pain   Right Shoulder - Pain    HPI Patient is a pleasant 88 year old woman who presents today with bilateral shoulder pain  times many years.  Left is worse than right.  She feels her motion is decreasing.  She has no pain in her neck.  She has been treating this with tramadol and Tylenol.  No of trauma on the left shoulder but she does have a history of a humeral neck fracture on the right.  Treated nonoperatively.  She is ambidextrous.  Review of Systems  All other systems reviewed and are negative.    Objective: Vital Signs: There were no vitals taken for this visit.  Physical Exam Constitutional:      Appearance: Normal appearance.  Pulmonary:     Effort: Pulmonary effort is normal.  Musculoskeletal:        General: Normal range of motion.  Skin:    General: Skin is warm and dry.  Neurological:     Mental Status: She is alert.     Ortho Exam Right shoulder: She is got a strong radial pulse sensation is intact she has good grip strength.  She can elevate her arm actively to about 75 degrees but does start using the accessory muscles of her neck to elevated further.  Difficult to go further passively.  She has very little external rotation if any.  On the left shoulder she is got a strong radial pulse  hand is warm good grip strength sensation intact.  She can actively elevate her arm to almost 90 degrees and passively over 100.  She has positive grinding with range of motion.  She does have fair external rotation though endpoint is quite painful. Specialty Comments:  No specialty comments available.  Imaging: No results found.   PMFS History: Patient Active Problem List   Diagnosis Date Noted   Bilateral shoulder pain 08/27/2022   Chest pain 09/15/2017   Primary localized osteoarthritis of right knee 07/21/2017   First degree AV block 11/23/2013   Diabetes mellitus without complication (HCC)    Schizophrenia    Hypertension    Dementia    Depression    ACE inhibitor-aggravated angioedema    Diverticulosis    Past Medical History:  Diagnosis Date   ACE inhibitor-aggravated angioedema     Dementia    Depression    Diabetes mellitus without complication    Diverticulosis    GERD (gastroesophageal reflux disease)    Hypertension    Osteoporosis    Schizophrenia     Family History  Problem Relation Age of Onset   Diabetes Mother    Hypertension Father     History reviewed. No pertinent surgical history. Social History   Occupational History   Not on file  Tobacco Use   Smoking status: Former   Smokeless tobacco: Never  Substance and Sexual Activity   Alcohol use: No   Drug use: No   Sexual activity: Never    Comment: lives at [facility].

## 2022-08-28 MED ORDER — METHYLPREDNISOLONE ACETATE 40 MG/ML IJ SUSP
40.0000 mg | INTRAMUSCULAR | Status: AC | PRN
  Administered 2022-08-27: 40 mg via INTRA_ARTICULAR

## 2022-08-28 MED ORDER — LIDOCAINE HCL 1 % IJ SOLN
5.0000 mL | INTRAMUSCULAR | Status: AC | PRN
  Administered 2022-08-27: 5 mL

## 2022-08-28 MED ORDER — BUPIVACAINE HCL 0.5 % IJ SOLN
9.0000 mL | INTRAMUSCULAR | Status: AC | PRN
  Administered 2022-08-27: 9 mL via INTRA_ARTICULAR

## 2022-11-28 ENCOUNTER — Other Ambulatory Visit: Payer: Self-pay

## 2022-11-28 ENCOUNTER — Ambulatory Visit: Payer: Medicare Other | Admitting: Surgical

## 2022-11-28 DIAGNOSIS — M19011 Primary osteoarthritis, right shoulder: Secondary | ICD-10-CM | POA: Diagnosis not present

## 2022-11-29 ENCOUNTER — Encounter: Payer: Self-pay | Admitting: Surgical

## 2022-11-29 MED ORDER — LIDOCAINE HCL 1 % IJ SOLN
5.0000 mL | INTRAMUSCULAR | Status: AC | PRN
  Administered 2022-11-28: 5 mL

## 2022-11-29 MED ORDER — BUPIVACAINE HCL 0.25 % IJ SOLN
9.0000 mL | INTRAMUSCULAR | Status: AC | PRN
  Administered 2022-11-28: 9 mL via INTRA_ARTICULAR

## 2022-11-29 MED ORDER — METHYLPREDNISOLONE ACETATE 40 MG/ML IJ SUSP
40.0000 mg | INTRAMUSCULAR | Status: AC | PRN
  Administered 2022-11-28: 40 mg via INTRA_ARTICULAR

## 2022-11-29 NOTE — Progress Notes (Signed)
   Procedure Note  Patient: Bonnie Watkins             Date of Birth: 09-26-1934           MRN: [id number]             Visit Date: 11/28/2022  Procedures: Visit Diagnoses:  1. Arthritis of right shoulder region     Large Joint Inj: R glenohumeral on 11/28/2022 11:00 AM Details: 22 G 3.5 in needle, ultrasound-guided posterior approach Medications: 5 mL lidocaine 1 %; 9 mL bupivacaine 0.25 %; 40 mg methylPREDNISolone acetate 40 MG/ML Outcome: tolerated well, no immediate complications  Patient is a 88 year old female who returns for right glenohumeral injection.  Previously had left glenohumeral injection in mid April with good relief that is still overall providing good relief for her left shoulder pain.  She was seen by [name] PA-C at that time and injection administered by myself.  Plan is injection into the right glenohumeral joint today.  Has history of prior humeral fracture that made injection more on the difficult side but there was excellent flow during the administration of the injection after repositioning the needle.  We will see how this does for her but if sleeving relief, next option would be repeat injection in a couple months but coming from the medial approach instead of lateral approach. Procedure, treatment alternatives, risks and benefits explained, specific risks discussed. Consent was given by the patient. Patient was prepped and draped in the usual sterile fashion.

## 2023-08-04 ENCOUNTER — Ambulatory Visit: Attending: Cardiology | Admitting: Cardiology

## 2023-08-04 ENCOUNTER — Encounter: Payer: Self-pay | Admitting: Cardiology

## 2023-08-04 VITALS — BP 109/57 | HR 97 | Resp 16 | Ht 64.0 in | Wt 234.0 lb

## 2023-08-04 DIAGNOSIS — E119 Type 2 diabetes mellitus without complications: Secondary | ICD-10-CM

## 2023-08-04 DIAGNOSIS — R0609 Other forms of dyspnea: Secondary | ICD-10-CM | POA: Diagnosis not present

## 2023-08-04 DIAGNOSIS — I10 Essential (primary) hypertension: Secondary | ICD-10-CM

## 2023-08-04 DIAGNOSIS — I491 Atrial premature depolarization: Secondary | ICD-10-CM | POA: Diagnosis not present

## 2023-08-04 NOTE — Progress Notes (Signed)
 Cardiology Office Note:  .   Date:  08/04/2023  ID:  Zaneta, Lightcap 1935/03/05, MRN [id number] PCP: [facility]  [facility] Providers Cardiologist:  Yates Decamp, MD   History of Present Illness: .   Bonnie Watkins is a 89 y.o. Caucasian female patient with morbid obesity, hypertension, hypercholesterolemia, diabetes mellitus, schizophrenia, dementia presently lives in a nursing facility, [facility] and is essentially wheelchair-bound referred to me for evaluation of congestive heart failure.  Patient's history is difficult to be obtained as she also has dementia.  Patient's daughter is present.  Patient denies any specific complaints today and states that she is fine.  She states she has occasional shortness of breath.  Daughter states that the patient herself has been doing relatively well at the nursing home.  There is no history to suggest PND or orthopnea or recent acute respiratory distress.  Discussed the use of AI scribe software for clinical note transcription with the patient, who gave verbal consent to proceed.    Labs    Lab Results  Component Value Date   HGBA1C 6.7 (H) 12/30/2018    Lab Results  Component Value Date   TSH 2.01 12/30/2018   Review of Systems  Unable to perform ROS: Dementia   Physical Exam:   VS:  BP (!) 109/57 (BP Location: Left Arm, Patient Position: Sitting, Cuff Size: Normal)   Pulse 97   Resp 16   Ht 5\' 4"  (1.626 m)   Wt 234 lb (106.1 kg) Comment: Patient reported  SpO2 91%   BMI 40.17 kg/m    Wt Readings from Last 3 Encounters:  08/04/23 234 lb (106.1 kg)  08/02/19 234 lb (106.1 kg)  01/13/19 224 lb 12 oz (101.9 kg)    Physical Exam Neck:     Vascular: No carotid bruit or JVD.  Cardiovascular:     Rate and Rhythm: Normal rate and regular rhythm.     Pulses: Intact distal pulses.          Dorsalis pedis pulses are 0 on the right side and 0 on the left side.        Posterior tibial pulses are 1+ on the right side and 1+ on the left side.     Heart sounds: Normal heart sounds. No murmur heard.    No gallop.  Pulmonary:     Effort: Pulmonary effort is normal.     Breath sounds: Normal breath sounds.  Abdominal:     General: Abdomen is protuberant. Bowel sounds are normal.     Palpations: Abdomen is soft.  Musculoskeletal:     Right lower leg: No edema.     Left lower leg: No edema.    Studies Reviewed: .    Echocardiogram 09/16/2017: - Left ventricle: The cavity size was normal. Wall thickness was increased in a pattern of mild LVH. Systolic function was normal. The estimated ejection fraction was in the range of 55% to 60%. Wall motion was normal; there were no regional wall motion abnormalities. Doppler parameters are consistent with abnormal left ventricular relaxation (grade 1 diastolic dysfunction). The E/e&' ratio is between 8-15, suggesting indeterminate LV filling pressure. - Mitral valve: Mildly thickened leaflets . There was trivial regurgitation. - Left atrium: The atrium was mildly dilated. - Inferior vena cava: The vessel was dilated. The respirophasic diameter changes were blunted (< 50%), consistent with elevated central venous pressure. EKG:    EKG Interpretation Date/Time:  Tuesday  August 04 2023 16:13:51 EDT Ventricular Rate:  86 PR Interval:  248 QRS Duration:  82 QT Interval:  358 QTC Calculation: 428 R Axis:   48  Text Interpretation: EKG 08/04/2023: Sinus rhythm with first-degree AV block at rate of 86 bpm, normal axis, poor R progression, cannot exclude anteroseptal infarct old. Frequent PACs.  Compared to 08/02/2019, no significant change, brief atrial tachycardia episodes documented. Confirmed by Delrae Rend [phone]) on 08/04/2023 4:48:09 PM    Medications and allergies    Allergies  Allergen Reactions   Shellfish Allergy Anaphylaxis    ALL SEAFOOD ALLERGY   Ace Inhibitors Other (See Comments)    On MAR    Angiotensin Receptor Blockers Other (See Comments)    ON MAR   Lisinopril     Angioedema   Strawberry Extract Hives   Robaxin [Methocarbamol] Rash     Current Outpatient Medications:    alendronate (FOSAMAX) 70 MG tablet, TAKE 1 TAB EACH WEEK 30 MIN PRIOR TO BREAKFAST WITH LARGE GLASS OF WATER. REMAIN UPRIGHT. (Patient taking differently: 70 mg once a week.), Disp: 12 tablet, Rfl: 3   amLODipine (NORVASC) 10 MG tablet, Take 1 tablet (10 mg total) by mouth daily., Disp: 90 tablet, Rfl: 3   B-D INSULIN SYRINGE 29G X 1/2" 1 ML MISC, USE AS DIRECTED., Disp: 100 each, Rfl: 0   benztropine (COGENTIN) 1 MG tablet, Take 1 mg by mouth daily. , Disp: , Rfl:    citalopram (CELEXA) 20 MG tablet, Take 20 mg by mouth daily., Disp: , Rfl:    diclofenac Sodium (VOLTAREN) 1 % GEL, Apply 1 g topically 4 (four) times daily., Disp: , Rfl:    furosemide (LASIX) 20 MG tablet, Take 20 mg by mouth., Disp: , Rfl:    ipratropium-albuterol (DUONEB) 0.5-2.5 (3) MG/3ML SOLN, Inhale 3 mLs into the lungs every 2 (two) hours as needed., Disp: , Rfl:    Lancets MISC, USE TO CHECK BLOOD SUGAR ONCE DAILY., Disp: 100 each, Rfl: 0   LANTUS 100 UNIT/ML injection, INJECT 10 UNITS SUBCUTANEOUSLY EACH MORNING. (Patient taking differently: Inject 8 Units into the skin in the morning.), Disp: 10 mL, Rfl: 2   levothyroxine (SYNTHROID, LEVOTHROID) 50 MCG tablet, TAKE ONE TABLET BY MOUTH ONCE DAILY. (Patient taking differently: Take 50 mcg by mouth daily before breakfast.), Disp: 30 tablet, Rfl: 5   lidocaine (LIDODERM) 5 %, Place 1 patch onto the skin daily., Disp: , Rfl:    LINZESS 72 MCG capsule, TAKE (1) CAPSULE BY MOUTH DAILY BEFORE BREAKFAST. (Patient taking differently: Take 72 mcg by mouth daily before breakfast.), Disp: 30 capsule, Rfl: 5   magnesium oxide (MAG-OX) 400 (240 Mg) MG tablet, Take 400 mg by mouth daily., Disp: , Rfl:    metFORMIN (GLUCOPHAGE) 1000 MG tablet, TAKE (1) TABLET BY MOUTH TWICE DAILY. (Patient taking  differently: Take 1,000 mg by mouth 2 (two) times daily with a meal.), Disp: 60 tablet, Rfl: 3   metoprolol succinate (TOPROL-XL) 25 MG 24 hr tablet, Take 25 mg by mouth daily., Disp: , Rfl:    omeprazole (PRILOSEC) 40 MG capsule, TAKE (1) CAPSULE BY MOUTH ONCE DAILY. (Patient taking differently: Take 40 mg by mouth daily.), Disp: 30 capsule, Rfl: 11   perphenazine (TRILAFON) 4 MG tablet, Take 1.5 tablets (6 mg total) by mouth at bedtime. (Patient taking differently: Take 6 mg by mouth in the morning.), Disp: 45 tablet, Rfl: 0   polyethylene glycol (MIRALAX / GLYCOLAX) 17 g packet, Take 17 g by mouth  daily., Disp: , Rfl:    Psyllium Fiber 0.52 g CAPS, TAKE (1) CAPSULE BY MOUTH THREE TIMES A DAY WITH FULL 8OZ OF WATER. (Patient taking differently: Take 52 g by mouth daily.), Disp: 90 capsule, Rfl: 5   QUEtiapine (SEROQUEL) 100 MG tablet, Take 1 tablet (100 mg total) by mouth at bedtime., Disp: 30 tablet, Rfl: 5   traMADol-acetaminophen (ULTRACET) 37.5-325 MG tablet, Take 1 tablet by mouth in the morning, at noon, and at bedtime., Disp: , Rfl:    vitamin B-12 (CYANOCOBALAMIN) 1000 MCG tablet, Take 1,000 mcg by mouth daily., Disp: , Rfl:    ASSESSMENT AND PLAN: .      ICD-10-CM   1. Dyspnea on exertion  R06.09 EKG 12-Lead    Do not attempt resuscitation (DNR)    2. Primary hypertension  I10     3. PAC (premature atrial contraction)  I49.1     4. Diabetes mellitus without complication (HCC)  E11.9 Do not attempt resuscitation (DNR)      No orders of the defined types were placed in this encounter.  Assessment and Plan    Shortness of breath   She was referred for evaluation of dyspnea, possibly related to cardiac issues. There have been no recent hospitalizations for heart failure, and symptoms are managed well at the current facility. No further cardiac testing is necessary at this time. Review current medications and adjust if necessary. Update the facility on her status and DNR order. She  is presently on Lasix 20 mg daily.  Although chronic diastolic heart failure is a potential, she does have underlying chronic hyponatremia and over the past 2 to 3 years she has not had any hospitalizations for heart failure and I do not have recent labs and hence I would defer starting her on an ACE inhibitor or ARB unless being closely monitored by her PCP for both hyponatremia and renal function but would consider this. I did not make any changes to her medications today.  Primary hypertension Blood pressure is under excellent control, presently on amlodipine 10 mg daily along with furosemide 20 mg daily.  Continue the same for now.  Irregular heartbeat She has had occasional PACs and also brief atrial tachycardia episodes when she presented to the emergency room for noncardiac issues.  She remains asymptomatic without palpitations.  Her history is also scanty as patient has dementia, but responds relatively appropriately but does not complain of any chest pain or dyspnea or palpitations.  Goals of Care   A DNR order is in place. The family, her daughter who is POA, and patient herself does not wish for intubation or life support if her heart stops, guided by the desire to avoid prolonged suffering on life support. Ensure DNR status is updated and communicated to all relevant parties.      Signed,  Yates Decamp, MD, [facility] 08/04/2023, 4:48 PM [facility] [address] [place], [place] [address] Phone: [phone]. Fax:  [phone]

## 2023-08-04 NOTE — Patient Instructions (Signed)

## 2023-11-09 ENCOUNTER — Ambulatory Visit: Admitting: Surgical

## 2023-11-09 ENCOUNTER — Other Ambulatory Visit: Payer: Self-pay

## 2023-11-09 DIAGNOSIS — M19012 Primary osteoarthritis, left shoulder: Secondary | ICD-10-CM | POA: Diagnosis not present

## 2023-11-09 DIAGNOSIS — G8929 Other chronic pain: Secondary | ICD-10-CM

## 2023-11-09 DIAGNOSIS — M25512 Pain in left shoulder: Secondary | ICD-10-CM | POA: Diagnosis not present

## 2023-11-09 DIAGNOSIS — M19011 Primary osteoarthritis, right shoulder: Secondary | ICD-10-CM | POA: Diagnosis not present

## 2023-11-12 ENCOUNTER — Encounter: Payer: Self-pay | Admitting: Surgical

## 2023-11-12 MED ORDER — LIDOCAINE HCL 1 % IJ SOLN
5.0000 mL | INTRAMUSCULAR | Status: AC | PRN
  Administered 2023-11-09: 5 mL

## 2023-11-12 MED ORDER — TRIAMCINOLONE ACETONIDE 40 MG/ML IJ SUSP
40.0000 mg | INTRAMUSCULAR | Status: AC | PRN
  Administered 2023-11-09: 40 mg via INTRA_ARTICULAR

## 2023-11-12 MED ORDER — BUPIVACAINE HCL 0.25 % IJ SOLN
9.0000 mL | INTRAMUSCULAR | Status: AC | PRN
  Administered 2023-11-09: 9 mL via INTRA_ARTICULAR

## 2023-11-12 NOTE — Progress Notes (Signed)
 Follow-up Office Visit Note   Patient: Bonnie Watkins           Date of Birth: 1935/01/16           MRN: [id number] Visit Date: 11/09/2023 Requested by: Center, [facility] [address] [place],  [place] [address] PCP: Center, [facility]  Subjective: Chief Complaint  Patient presents with   Right Shoulder - Pain   Left Shoulder - Pain    HPI: Bonnie Watkins is a 89 y.o. female who returns to the office for follow-up visit.    Plan at last visit was: Right glenohumeral injection under ultrasound guidance on 11/28/2022  Since then, patient notes she had great relief of her shoulder pain for about 9 months.  She would like to repeat this injection today for both shoulders.  She does have a history of diabetes and no recent A1c.  She states her blood sugar is typically around 140-160 in the morning.  She stays at nursing facility [facility].              ROS: All systems reviewed are negative as they relate to the chief complaint within the history of present illness.  Patient denies fevers or chills.  Assessment & Plan: Visit Diagnoses:  1. Chronic left shoulder pain     Plan: [name] is a 89 y.o. female who returns to the office for follow-up visit.  Plan from last visit was noted above in HPI.  They now return with great relief from ultrasound glenohumeral injections in 2024.  Would like to repeat these since they lasted for about 9 months each.  Plan to administer left glenohumeral injection today and we can do the right in about 2 weeks in order to limit the risk of spiking her blood sugar.  Tolerated left injection well.  Follow-up with the office in 2 weeks if she would like to have the right shoulder injected.  Follow-Up Instructions: No follow-ups on file.   Orders:  Orders Placed This Encounter  Procedures   US  Guided Needle Placement - No Linked Charges   No orders of the defined types were placed in this  encounter.     Procedures: Large Joint Inj: L glenohumeral on 11/09/2023 4:22 PM Indications: pain and diagnostic evaluation Details: 22 G 3.5 in needle, ultrasound-guided posterior approach Medications: 5 mL lidocaine  1 %; 9 mL bupivacaine  0.25 %; 40 mg triamcinolone  acetonide 40 MG/ML Outcome: tolerated well, no immediate complications Procedure, treatment alternatives, risks and benefits explained, specific risks discussed. Consent was given by the patient. Immediately prior to procedure a time out was called to verify the correct patient, procedure, equipment, support staff and site/side marked as required. Patient was prepped and draped in the usual sterile fashion.       Clinical Data: No additional findings.  Objective: Vital Signs: There were no vitals taken for this visit.  Physical Exam:  Constitutional: Patient appears well-developed HEENT:  Head: Normocephalic Eyes:EOM are normal Neck: Normal range of motion Cardiovascular: Normal rate Pulmonary/chest: Effort normal Neurologic: Patient is alert Skin: Skin is warm Psychiatric: Patient has normal mood and affect  Ortho Exam: Ortho exam demonstrates painful and restricted external rotation of the right shoulder with less painful and much less restricted external rotation of the left shoulder.  No cellulitis or skin changes noted throughout either shoulder region.  2+ radial pulse of bilateral upper extremities.  Intact EPL, FPL, finger abduction.  Specialty Comments:  No specialty  comments available.  Imaging: No results found.   PMFS History: Patient Active Problem List   Diagnosis Date Noted   Bilateral shoulder pain 08/27/2022   Secondary osteoarthritis of shoulders, bilateral 08/27/2022   Chest pain 09/15/2017   Primary localized osteoarthritis of right knee 07/21/2017   First degree AV block 11/23/2013   Diabetes mellitus without complication (HCC)    Schizophrenia (HCC)    Hypertension    Dementia  (HCC)    Depression    ACE inhibitor-aggravated angioedema    Diverticulosis    Past Medical History:  Diagnosis Date   ACE inhibitor-aggravated angioedema    Dementia (HCC)    Depression    Diabetes mellitus without complication (HCC)    Diverticulosis    GERD (gastroesophageal reflux disease)    Hypertension    Osteoporosis    Schizophrenia (HCC)     Family History  Problem Relation Age of Onset   Diabetes Mother    Hypertension Father     No past surgical history on file. Social History   Occupational History   Not on file  Tobacco Use   Smoking status: Former   Smokeless tobacco: Never  Substance and Sexual Activity   Alcohol use: No   Drug use: No   Sexual activity: Never    Comment: lives at [facility].

## 2023-12-04 ENCOUNTER — Ambulatory Visit: Admitting: Surgical

## 2023-12-11 ENCOUNTER — Ambulatory Visit: Admitting: Surgical

## 2023-12-11 ENCOUNTER — Encounter: Payer: Self-pay | Admitting: Surgical

## 2023-12-11 ENCOUNTER — Other Ambulatory Visit: Payer: Self-pay

## 2023-12-11 DIAGNOSIS — M19011 Primary osteoarthritis, right shoulder: Secondary | ICD-10-CM | POA: Diagnosis not present

## 2023-12-11 MED ORDER — TRIAMCINOLONE ACETONIDE 40 MG/ML IJ SUSP
40.0000 mg | INTRAMUSCULAR | Status: AC | PRN
  Administered 2023-12-11: 40 mg via INTRA_ARTICULAR

## 2023-12-11 MED ORDER — BUPIVACAINE HCL 0.25 % IJ SOLN
9.0000 mL | INTRAMUSCULAR | Status: AC | PRN
  Administered 2023-12-11: 9 mL via INTRA_ARTICULAR

## 2023-12-11 NOTE — Progress Notes (Signed)
   Procedure Note  Patient: Bonnie Watkins             Date of Birth: 1934-07-19           MRN: [id number]             Visit Date: 12/11/2023  Procedures: Visit Diagnoses:  1. Arthritis of right shoulder region     Large Joint Inj: R glenohumeral on 12/11/2023 4:29 PM Indications: pain and diagnostic evaluation Details: 22 G 3.5 in needle, ultrasound-guided posterior approach Medications: 9 mL bupivacaine  0.25 %; 40 mg triamcinolone  acetonide 40 MG/ML Outcome: tolerated well, no immediate complications Procedure, treatment alternatives, risks and benefits explained, specific risks discussed. Consent was given by the patient. Immediately prior to procedure a time out was called to verify the correct patient, procedure, equipment, support staff and site/side marked as required. Patient was prepped and draped in the usual sterile fashion.

## 2024-03-14 ENCOUNTER — Encounter: Payer: Self-pay | Admitting: Radiology

## 2024-06-13 ENCOUNTER — Emergency Department (HOSPITAL_COMMUNITY)

## 2024-06-13 ENCOUNTER — Inpatient Hospital Stay (HOSPITAL_COMMUNITY)
Admission: EM | Admit: 2024-06-13 | Discharge: 2024-06-17 | Disposition: A | Discharge status: Skilled Nursing Facility | Source: Home / Self Care | Attending: Internal Medicine | Admitting: Internal Medicine

## 2024-06-13 DIAGNOSIS — E876 Hypokalemia: Secondary | ICD-10-CM | POA: Diagnosis present

## 2024-06-13 DIAGNOSIS — R221 Localized swelling, mass and lump, neck: Secondary | ICD-10-CM

## 2024-06-13 DIAGNOSIS — I509 Heart failure, unspecified: Secondary | ICD-10-CM

## 2024-06-13 DIAGNOSIS — E039 Hypothyroidism, unspecified: Secondary | ICD-10-CM

## 2024-06-13 DIAGNOSIS — L03221 Cellulitis of neck: Secondary | ICD-10-CM

## 2024-06-13 DIAGNOSIS — F0390 Unspecified dementia without behavioral disturbance: Secondary | ICD-10-CM | POA: Diagnosis present

## 2024-06-13 DIAGNOSIS — E1169 Type 2 diabetes mellitus with other specified complication: Secondary | ICD-10-CM | POA: Diagnosis present

## 2024-06-13 DIAGNOSIS — I480 Paroxysmal atrial fibrillation: Secondary | ICD-10-CM

## 2024-06-13 DIAGNOSIS — K1121 Acute sialoadenitis: Secondary | ICD-10-CM | POA: Diagnosis present

## 2024-06-13 DIAGNOSIS — I10 Essential (primary) hypertension: Secondary | ICD-10-CM | POA: Diagnosis present

## 2024-06-13 DIAGNOSIS — B9689 Other specified bacterial agents as the cause of diseases classified elsewhere: Secondary | ICD-10-CM | POA: Diagnosis present

## 2024-06-13 DIAGNOSIS — J101 Influenza due to other identified influenza virus with other respiratory manifestations: Secondary | ICD-10-CM | POA: Diagnosis present

## 2024-06-13 DIAGNOSIS — J45901 Unspecified asthma with (acute) exacerbation: Secondary | ICD-10-CM | POA: Diagnosis present

## 2024-06-13 DIAGNOSIS — J111 Influenza due to unidentified influenza virus with other respiratory manifestations: Principal | ICD-10-CM

## 2024-06-13 LAB — CBC WITH DIFFERENTIAL/PLATELET
Abs Immature Granulocytes: 0.01 10*3/uL (ref 0.00–0.07)
Basophils Absolute: 0 10*3/uL (ref 0.0–0.1)
Basophils Relative: 0 %
Eosinophils Absolute: 0 10*3/uL (ref 0.0–0.5)
Eosinophils Relative: 1 %
HCT: 30.4 % — ABNORMAL LOW (ref 36.0–46.0)
Hemoglobin: 9 g/dL — ABNORMAL LOW (ref 12.0–15.0)
Immature Granulocytes: 0 %
Lymphocytes Relative: 41 %
Lymphs Abs: 2.8 10*3/uL (ref 0.7–4.0)
MCH: 26.8 pg (ref 26.0–34.0)
MCHC: 29.6 g/dL — ABNORMAL LOW (ref 30.0–36.0)
MCV: 90.5 fL (ref 80.0–100.0)
Monocytes Absolute: 0.5 10*3/uL (ref 0.1–1.0)
Monocytes Relative: 7 %
Neutro Abs: 3.6 10*3/uL (ref 1.7–7.7)
Neutrophils Relative %: 51 %
Platelets: 204 10*3/uL (ref 150–400)
RBC: 3.36 MIL/uL — ABNORMAL LOW (ref 3.87–5.11)
RDW: 14.5 % (ref 11.5–15.5)
WBC: 7 10*3/uL (ref 4.0–10.5)
nRBC: 0 % (ref 0.0–0.2)

## 2024-06-13 LAB — RESP PANEL BY RT-PCR (RSV, FLU A&B, COVID)  RVPGX2
Influenza A by PCR: POSITIVE — AB
Influenza B by PCR: NEGATIVE
Resp Syncytial Virus by PCR: NEGATIVE
SARS Coronavirus 2 by RT PCR: NEGATIVE

## 2024-06-13 LAB — BASIC METABOLIC PANEL WITH GFR
Anion gap: 9 (ref 5–15)
BUN: 22 mg/dL (ref 8–23)
CO2: 25 mmol/L (ref 22–32)
Calcium: 8.8 mg/dL — ABNORMAL LOW (ref 8.9–10.3)
Chloride: 101 mmol/L (ref 98–111)
Creatinine, Ser: 0.97 mg/dL (ref 0.44–1.00)
GFR, Estimated: 56 mL/min — ABNORMAL LOW
Glucose, Bld: 111 mg/dL — ABNORMAL HIGH (ref 70–99)
Potassium: 4.3 mmol/L (ref 3.5–5.1)
Sodium: 135 mmol/L (ref 135–145)

## 2024-06-13 LAB — PRO BRAIN NATRIURETIC PEPTIDE: Pro Brain Natriuretic Peptide: 6104 pg/mL — ABNORMAL HIGH

## 2024-06-13 MED ORDER — DEXAMETHASONE SOD PHOSPHATE PF 10 MG/ML IJ SOLN
6.0000 mg | Freq: Once | INTRAMUSCULAR | Status: AC
  Administered 2024-06-13: 6 mg via INTRAVENOUS
  Filled 2024-06-13: qty 1

## 2024-06-13 MED ORDER — IOHEXOL 300 MG/ML  SOLN
75.0000 mL | Freq: Once | INTRAMUSCULAR | Status: AC | PRN
  Administered 2024-06-13: 75 mL via INTRAVENOUS

## 2024-06-13 MED ORDER — OSELTAMIVIR PHOSPHATE 75 MG PO CAPS
75.0000 mg | ORAL_CAPSULE | Freq: Once | ORAL | Status: AC
  Administered 2024-06-13: 75 mg via ORAL
  Filled 2024-06-13: qty 1

## 2024-06-13 MED ORDER — SODIUM CHLORIDE 0.9 % IV SOLN
100.0000 mg | Freq: Once | INTRAVENOUS | Status: AC
  Administered 2024-06-13: 100 mg via INTRAVENOUS
  Filled 2024-06-13: qty 100

## 2024-06-13 NOTE — ED Triage Notes (Signed)
 Patient BIB EMS from [facility] with complaints of cough. Started 3 days ago, swelling to left side of neck.  Rhonci in all lung fields, dark brown sputum per EMS. Hx of Dementia but answers most questions appropriately. Family with patient. Patient is on oxygen at night.

## 2024-06-14 ENCOUNTER — Inpatient Hospital Stay (HOSPITAL_COMMUNITY)

## 2024-06-14 DIAGNOSIS — K1121 Acute sialoadenitis: Secondary | ICD-10-CM | POA: Diagnosis present

## 2024-06-14 DIAGNOSIS — I5031 Acute diastolic (congestive) heart failure: Secondary | ICD-10-CM

## 2024-06-14 DIAGNOSIS — J45901 Unspecified asthma with (acute) exacerbation: Secondary | ICD-10-CM | POA: Diagnosis present

## 2024-06-14 DIAGNOSIS — R221 Localized swelling, mass and lump, neck: Secondary | ICD-10-CM

## 2024-06-14 DIAGNOSIS — I509 Heart failure, unspecified: Secondary | ICD-10-CM

## 2024-06-14 DIAGNOSIS — J101 Influenza due to other identified influenza virus with other respiratory manifestations: Secondary | ICD-10-CM | POA: Diagnosis present

## 2024-06-14 LAB — RETICULOCYTES
Immature Retic Fract: 10.5 % (ref 2.3–15.9)
RBC.: 3.47 MIL/uL — ABNORMAL LOW (ref 3.87–5.11)
Retic Count, Absolute: 28.5 10*3/uL (ref 19.0–186.0)
Retic Ct Pct: 0.8 % (ref 0.4–3.1)

## 2024-06-14 LAB — COMPREHENSIVE METABOLIC PANEL WITH GFR
ALT: 12 U/L (ref 0–44)
AST: 24 U/L (ref 15–41)
Albumin: 3.7 g/dL (ref 3.5–5.0)
Alkaline Phosphatase: 66 U/L (ref 38–126)
Anion gap: 8 (ref 5–15)
BUN: 17 mg/dL (ref 8–23)
CO2: 26 mmol/L (ref 22–32)
Calcium: 9.1 mg/dL (ref 8.9–10.3)
Chloride: 100 mmol/L (ref 98–111)
Creatinine, Ser: 0.84 mg/dL (ref 0.44–1.00)
GFR, Estimated: >60 mL/min
Glucose, Bld: 172 mg/dL — ABNORMAL HIGH (ref 70–99)
Potassium: 4.4 mmol/L (ref 3.5–5.1)
Sodium: 134 mmol/L — ABNORMAL LOW (ref 135–145)
Total Bilirubin: 0.5 mg/dL (ref 0.0–1.2)
Total Protein: 8.6 g/dL — ABNORMAL HIGH (ref 6.5–8.1)

## 2024-06-14 LAB — CBC
HCT: 31.3 % — ABNORMAL LOW (ref 36.0–46.0)
Hemoglobin: 9.3 g/dL — ABNORMAL LOW (ref 12.0–15.0)
MCH: 26.3 pg (ref 26.0–34.0)
MCHC: 29.7 g/dL — ABNORMAL LOW (ref 30.0–36.0)
MCV: 88.7 fL (ref 80.0–100.0)
Platelets: 216 10*3/uL (ref 150–400)
RBC: 3.53 MIL/uL — ABNORMAL LOW (ref 3.87–5.11)
RDW: 14.6 % (ref 11.5–15.5)
WBC: 5.3 10*3/uL (ref 4.0–10.5)
nRBC: 0 % (ref 0.0–0.2)

## 2024-06-14 LAB — ECHOCARDIOGRAM COMPLETE
AR max vel: 1.72 cm2
AV Area VTI: 1.58 cm2
AV Area mean vel: 1.5 cm2
AV Mean grad: 5 mmHg
AV Peak grad: 7.2 mmHg
Ao pk vel: 1.35 m/s
Area-P 1/2: 5 cm2
Calc EF: 60.2 %
Height: 64 in
MV VTI: 1.56 cm2
S' Lateral: 2.9 cm
Single Plane A2C EF: 64.5 %
Single Plane A4C EF: 56.6 %
Weight: 3742.53 [oz_av]

## 2024-06-14 LAB — FOLATE: Folate: 9.7 ng/mL

## 2024-06-14 LAB — IRON AND TIBC
Iron: 14 ug/dL — ABNORMAL LOW (ref 28–170)
Saturation Ratios: 4 % — ABNORMAL LOW (ref 10.4–31.8)
TIBC: 314 ug/dL (ref 250–450)
UIBC: 300 ug/dL

## 2024-06-14 LAB — HEMOGLOBIN A1C
Hgb A1c MFr Bld: 6.4 % — ABNORMAL HIGH (ref 4.8–5.6)
Mean Plasma Glucose: 136.98 mg/dL

## 2024-06-14 LAB — FERRITIN: Ferritin: 112 ng/mL (ref 11–307)

## 2024-06-14 LAB — GLUCOSE, CAPILLARY
Glucose-Capillary: 142 mg/dL — ABNORMAL HIGH (ref 70–99)
Glucose-Capillary: 194 mg/dL — ABNORMAL HIGH (ref 70–99)

## 2024-06-14 LAB — CBG MONITORING, ED
Glucose-Capillary: 161 mg/dL — ABNORMAL HIGH (ref 70–99)
Glucose-Capillary: 162 mg/dL — ABNORMAL HIGH (ref 70–99)

## 2024-06-14 LAB — VITAMIN B12: Vitamin B-12: 683 pg/mL (ref 180–914)

## 2024-06-14 MED ORDER — LEVOTHYROXINE SODIUM 50 MCG PO TABS
50.0000 ug | ORAL_TABLET | Freq: Every day | ORAL | Status: DC
  Administered 2024-06-14 – 2024-06-17 (×4): 50 ug via ORAL
  Filled 2024-06-14 (×4): qty 1

## 2024-06-14 MED ORDER — METOPROLOL TARTRATE 5 MG/5ML IV SOLN: 5.0000 mg | Freq: Four times a day (QID) | INTRAVENOUS | Status: DC | PRN

## 2024-06-14 MED ORDER — INSULIN ASPART 100 UNIT/ML IJ SOLN
0.0000 [IU] | INTRAMUSCULAR | Status: DC
  Administered 2024-06-14 (×2): 2 [IU] via SUBCUTANEOUS
  Administered 2024-06-14: 1 [IU] via SUBCUTANEOUS
  Administered 2024-06-14: 2 [IU] via SUBCUTANEOUS
  Administered 2024-06-15: 1 [IU] via SUBCUTANEOUS
  Administered 2024-06-15 (×2): 2 [IU] via SUBCUTANEOUS
  Administered 2024-06-16: 1 [IU] via SUBCUTANEOUS
  Administered 2024-06-16: 2 [IU] via SUBCUTANEOUS
  Administered 2024-06-16 (×2): 1 [IU] via SUBCUTANEOUS
  Administered 2024-06-16: 2 [IU] via SUBCUTANEOUS
  Administered 2024-06-17 (×3): 1 [IU] via SUBCUTANEOUS
  Filled 2024-06-14 (×2): qty 1
  Filled 2024-06-14: qty 2
  Filled 2024-06-14: qty 1
  Filled 2024-06-14 (×2): qty 2
  Filled 2024-06-14: qty 1
  Filled 2024-06-14: qty 2
  Filled 2024-06-14 (×3): qty 1
  Filled 2024-06-14 (×4): qty 2

## 2024-06-14 MED ORDER — ACETAMINOPHEN 325 MG PO TABS: 650.0000 mg | ORAL_TABLET | Freq: Four times a day (QID) | ORAL | Status: DC | PRN

## 2024-06-14 MED ORDER — IPRATROPIUM-ALBUTEROL 0.5-2.5 (3) MG/3ML IN SOLN
3.0000 mL | Freq: Four times a day (QID) | RESPIRATORY_TRACT | Status: DC
  Administered 2024-06-14 – 2024-06-17 (×12): 3 mL via RESPIRATORY_TRACT
  Filled 2024-06-14 (×11): qty 3

## 2024-06-14 MED ORDER — CITALOPRAM HYDROBROMIDE 20 MG PO TABS
20.0000 mg | ORAL_TABLET | Freq: Every day | ORAL | Status: DC
  Administered 2024-06-14 – 2024-06-17 (×4): 20 mg via ORAL
  Filled 2024-06-14 (×3): qty 1
  Filled 2024-06-14: qty 2

## 2024-06-14 MED ORDER — BENZTROPINE MESYLATE 1 MG PO TABS
1.0000 mg | ORAL_TABLET | Freq: Every day | ORAL | Status: DC
  Administered 2024-06-14 – 2024-06-17 (×4): 1 mg via ORAL
  Filled 2024-06-14: qty 2
  Filled 2024-06-14 (×3): qty 1

## 2024-06-14 MED ORDER — ACETAMINOPHEN 500 MG PO TABS
1000.0000 mg | ORAL_TABLET | Freq: Three times a day (TID) | ORAL | Status: DC
  Administered 2024-06-14 – 2024-06-17 (×10): 1000 mg via ORAL
  Filled 2024-06-14 (×10): qty 2

## 2024-06-14 MED ORDER — SODIUM CHLORIDE 0.9 % IV SOLN
3.0000 g | Freq: Four times a day (QID) | INTRAVENOUS | Status: DC
  Administered 2024-06-14 – 2024-06-16 (×11): 3 g via INTRAVENOUS
  Filled 2024-06-14 (×11): qty 8

## 2024-06-14 MED ORDER — BUDESONIDE 0.25 MG/2ML IN SUSP
0.2500 mg | Freq: Two times a day (BID) | RESPIRATORY_TRACT | Status: DC
  Administered 2024-06-14 – 2024-06-17 (×7): 0.25 mg via RESPIRATORY_TRACT
  Filled 2024-06-14 (×8): qty 2

## 2024-06-14 MED ORDER — ACETAMINOPHEN 650 MG RE SUPP: 650.0000 mg | Freq: Four times a day (QID) | RECTAL | Status: DC | PRN

## 2024-06-14 MED ORDER — ALBUTEROL SULFATE (2.5 MG/3ML) 0.083% IN NEBU: 2.5000 mg | INHALATION_SOLUTION | RESPIRATORY_TRACT | Status: DC | PRN

## 2024-06-14 MED ORDER — FUROSEMIDE 10 MG/ML IJ SOLN
40.0000 mg | Freq: Once | INTRAMUSCULAR | Status: DC
  Filled 2024-06-14: qty 4

## 2024-06-14 MED ORDER — TRAMADOL HCL 50 MG PO TABS
50.0000 mg | ORAL_TABLET | Freq: Three times a day (TID) | ORAL | Status: DC | PRN
  Administered 2024-06-15: 50 mg via ORAL
  Filled 2024-06-14 (×2): qty 1

## 2024-06-14 MED ORDER — OSELTAMIVIR PHOSPHATE 30 MG PO CAPS
30.0000 mg | ORAL_CAPSULE | Freq: Two times a day (BID) | ORAL | Status: DC
  Administered 2024-06-14 – 2024-06-17 (×7): 30 mg via ORAL
  Filled 2024-06-14 (×8): qty 1

## 2024-06-14 MED ORDER — TRAMADOL-ACETAMINOPHEN 37.5-325 MG PO TABS: 1.0000 | ORAL_TABLET | Freq: Three times a day (TID) | ORAL | Status: DC | PRN

## 2024-06-14 MED ORDER — DEXAMETHASONE SOD PHOSPHATE PF 10 MG/ML IJ SOLN
6.0000 mg | INTRAMUSCULAR | Status: DC
  Administered 2024-06-14: 6 mg via INTRAVENOUS
  Filled 2024-06-14: qty 1

## 2024-06-14 MED ORDER — QUETIAPINE FUMARATE 100 MG PO TABS
100.0000 mg | ORAL_TABLET | Freq: Every day | ORAL | Status: DC
  Administered 2024-06-14 – 2024-06-16 (×3): 100 mg via ORAL
  Filled 2024-06-14 (×3): qty 1

## 2024-06-14 MED ORDER — FUROSEMIDE 10 MG/ML IJ SOLN
40.0000 mg | Freq: Two times a day (BID) | INTRAMUSCULAR | Status: DC
  Administered 2024-06-14 – 2024-06-15 (×3): 40 mg via INTRAVENOUS
  Filled 2024-06-14 (×3): qty 4

## 2024-06-14 NOTE — Progress Notes (Addendum)
"   Same-day note 89 year old female from [facility] nursing home with multiple comorbidities admitted with swelling of the left side of the neck/face with CT showing lower left facial swelling with subcutaneous induration without abscess or drainable fluid collection without identifiable infectious source.  ED physician discussed with Dr. Roark from ENT recommended IV Decadron  and antibiotics for cellulitis and admit the patient to [facility].  Patient is in [place] ER waiting to be transferred to [facility].  Continue Unasyn  and Decadron . Her outpatient medications have been verified with the pharmacy and I will restart them CT NECK Moderate lower left facial swelling with subcutaneous induration without abscess or drainable fluid collection, and without an identifiable infectious source. Chest x-ray Small bilateral pleural effusions.Minimal bibasilar atelectasis.Mild cardiomegaly. End-stage degenerative changes of both shoulders, right worse than left. Patient seen in the ED daughter at bedside.  She appears comfortable on room air.  Elevated BNP started on Lasix  twice daily was on Lasix  prior to admission.  Monitor renal functions. "

## 2024-06-14 NOTE — Plan of Care (Signed)
   Problem: Education: Goal: Ability to describe self-care measures that may prevent or decrease complications (Diabetes Survival Skills Education) will improve Outcome: Progressing

## 2024-06-14 NOTE — Progress Notes (Signed)
 Heart Failure Navigator Progress Note  Assessed for Heart & Vascular TOC clinic readiness.  Patient does not meet criteria due to per MD note patient with history of Dementia. No HF TOC. .   Navigator will sign off at this time.   Randie Bustle, BSN, Scientist, clinical (histocompatibility and immunogenetics) Only

## 2024-06-15 DIAGNOSIS — E039 Hypothyroidism, unspecified: Secondary | ICD-10-CM

## 2024-06-15 DIAGNOSIS — I480 Paroxysmal atrial fibrillation: Secondary | ICD-10-CM

## 2024-06-15 DIAGNOSIS — E876 Hypokalemia: Secondary | ICD-10-CM | POA: Diagnosis present

## 2024-06-15 DIAGNOSIS — E1169 Type 2 diabetes mellitus with other specified complication: Secondary | ICD-10-CM | POA: Diagnosis present

## 2024-06-15 DIAGNOSIS — I10 Essential (primary) hypertension: Secondary | ICD-10-CM | POA: Diagnosis present

## 2024-06-15 LAB — COMPREHENSIVE METABOLIC PANEL WITH GFR
ALT: 12 U/L (ref 0–44)
AST: 21 U/L (ref 15–41)
Albumin: 3.5 g/dL (ref 3.5–5.0)
Alkaline Phosphatase: 57 U/L (ref 38–126)
Anion gap: 11 (ref 5–15)
BUN: 20 mg/dL (ref 8–23)
CO2: 28 mmol/L (ref 22–32)
Calcium: 8.9 mg/dL (ref 8.9–10.3)
Chloride: 98 mmol/L (ref 98–111)
Creatinine, Ser: 0.95 mg/dL (ref 0.44–1.00)
GFR, Estimated: 58 mL/min — ABNORMAL LOW
Glucose, Bld: 130 mg/dL — ABNORMAL HIGH (ref 70–99)
Potassium: 3.3 mmol/L — ABNORMAL LOW (ref 3.5–5.1)
Sodium: 137 mmol/L (ref 135–145)
Total Bilirubin: 0.6 mg/dL (ref 0.0–1.2)
Total Protein: 8 g/dL (ref 6.5–8.1)

## 2024-06-15 LAB — GLUCOSE, CAPILLARY
Glucose-Capillary: 116 mg/dL — ABNORMAL HIGH (ref 70–99)
Glucose-Capillary: 131 mg/dL — ABNORMAL HIGH (ref 70–99)
Glucose-Capillary: 142 mg/dL — ABNORMAL HIGH (ref 70–99)
Glucose-Capillary: 160 mg/dL — ABNORMAL HIGH (ref 70–99)
Glucose-Capillary: 187 mg/dL — ABNORMAL HIGH (ref 70–99)
Glucose-Capillary: 86 mg/dL (ref 70–99)

## 2024-06-15 LAB — CBC
HCT: 31 % — ABNORMAL LOW (ref 36.0–46.0)
Hemoglobin: 9.8 g/dL — ABNORMAL LOW (ref 12.0–15.0)
MCH: 26.8 pg (ref 26.0–34.0)
MCHC: 31.6 g/dL (ref 30.0–36.0)
MCV: 84.9 fL (ref 80.0–100.0)
Platelets: 231 10*3/uL (ref 150–400)
RBC: 3.65 MIL/uL — ABNORMAL LOW (ref 3.87–5.11)
RDW: 14.2 % (ref 11.5–15.5)
WBC: 9.8 10*3/uL (ref 4.0–10.5)
nRBC: 0 % (ref 0.0–0.2)

## 2024-06-15 MED ORDER — GUAIFENESIN-DM 100-10 MG/5ML PO SYRP
5.0000 mL | ORAL_SOLUTION | ORAL | Status: DC | PRN
  Administered 2024-06-15 – 2024-06-17 (×2): 5 mL via ORAL
  Filled 2024-06-15 (×2): qty 5

## 2024-06-15 MED ORDER — APIXABAN 5 MG PO TABS
5.0000 mg | ORAL_TABLET | Freq: Two times a day (BID) | ORAL | Status: DC
  Administered 2024-06-15 – 2024-06-17 (×4): 5 mg via ORAL
  Filled 2024-06-15 (×5): qty 1

## 2024-06-15 MED ORDER — POTASSIUM CHLORIDE CRYS ER 20 MEQ PO TBCR
40.0000 meq | EXTENDED_RELEASE_TABLET | ORAL | Status: AC
  Administered 2024-06-15 (×2): 40 meq via ORAL
  Filled 2024-06-15 (×2): qty 2

## 2024-06-15 MED ORDER — DOXYCYCLINE HYCLATE 100 MG PO TABS
100.0000 mg | ORAL_TABLET | Freq: Two times a day (BID) | ORAL | Status: DC
  Administered 2024-06-15 – 2024-06-17 (×5): 100 mg via ORAL
  Filled 2024-06-15 (×5): qty 1

## 2024-06-15 NOTE — TOC Initial Note (Signed)
 Transition of Care [facility]) - Initial/Assessment Note    Patient Details  Name: Bonnie Watkins MRN: [id number] Date of Birth: 1934/06/25  Transition of Care [facility]) CM/SW Contact:    Luise JAYSON Pan, LCSWA Phone Number: 06/15/2024, 11:42 AM  Clinical Narrative:  Per bedside RN, patients daughter would like patient to be transferred to another facility. Per, Therisa, she has already sent in a complaint to the state about patient. Therisa stated that she understands patient may have to discharge back when medically ready but would still like her mother transferred to another facility at some point. CSW explained that per MD, patient will be medically stable tomorrow for dc. CSW informed Therisa that she can follow up with the facility SW about a transfer. Therisa expressed her understanding. CSW informed Therisa to look at [employer] for other facilities.   CSW will continue to follow.     Expected Discharge Plan: Skilled Nursing Facility Barriers to Discharge: Continued Medical Work up   Patient Goals and CMS Choice Patient states their goals for this hospitalization and ongoing recovery are:: To return to ltc          Expected Discharge Plan and Services In-house Referral: Clinical Social Work     Living arrangements for the past 2 months: Skilled Nursing Facility                                      Prior Living Arrangements/Services Living arrangements for the past 2 months: Skilled Nursing Facility Lives with:: Facility Resident Patient language and need for interpreter reviewed:: Yes Do you feel safe going back to the place where you live?: Yes      Need for Family Participation in Patient Care: Yes (Comment) Care giver support system in place?: Yes (comment)   Criminal Activity/Legal Involvement Pertinent to Current Situation/Hospitalization: No - Comment as needed  Activities of Daily Living      Permission Sought/Granted Permission sought to share  information with : Facility Medical Sales Representative, Family Supports Permission granted to share information with : No (Family contact info in chart, from [facility])  Share Information with NAME: Celinda Therisa  Permission granted to share info w AGENCY: SNF  Permission granted to share info w Relationship: Daughter, Emergency Contact  Permission granted to share info w Contact Information: [phone]  Emotional Assessment Appearance:: Appears stated age Attitude/Demeanor/Rapport: Unable to Assess Affect (typically observed): Unable to Assess Orientation: : Oriented to Self, Oriented to Place Alcohol / Substance Use: Not Applicable Psych Involvement: No (comment)  Admission diagnosis:  Acute exacerbation of CHF (congestive heart failure) (HCC) [I50.9] Influenza [J11.1] Patient Active Problem List   Diagnosis Date Noted   Acute exacerbation of CHF (congestive heart failure) (HCC) 06/14/2024   Neck swelling 06/14/2024   Influenza A 06/14/2024   Asthma exacerbation 06/14/2024   Bilateral shoulder pain 08/27/2022   Secondary osteoarthritis of shoulders, bilateral 08/27/2022   Chest pain 09/15/2017   Primary localized osteoarthritis of right knee 07/21/2017   First degree AV block 11/23/2013   Diabetes mellitus without complication (HCC)    Schizophrenia (HCC)    Hypertension    Dementia (HCC)    Depression    ACE inhibitor-aggravated angioedema    Diverticulosis    PCP:  [facility] Pharmacy:  No Pharmacies Listed    Social Drivers of Health (SDOH) Social History: SDOH Screenings   Food Insecurity: No Food Insecurity (06/14/2024)  Housing:  Unknown (06/14/2024)  Transportation Needs: No Transportation Needs (06/14/2024)  Utilities: Not At Risk (06/14/2024)  Social Connections: Unknown (06/14/2024)  Tobacco Use: Medium Risk (12/11/2023)   SDOH Interventions:     Readmission Risk Interventions     No data to display

## 2024-06-15 NOTE — Assessment & Plan Note (Signed)
 Continue with oseltamivir   No signs of viral pneumonia

## 2024-06-15 NOTE — NC FL2 (Signed)
 " [place]  MEDICAID FL2 LEVEL OF CARE FORM     IDENTIFICATION  Patient Name: Bonnie Watkins Birthdate: 09/30/1934 Sex: female Admission Date (Current Location): 06/13/2024  [facility] and [place] Number:  Producer, Television/film/video and Address:  [facility] [place], [address], [place], [place] [address]      Provider Number: [id number]  Attending Physician Name and Address:  Noralee Elidia Sieving,*  Relative Name and Phone Number:  Celinda Kung  Daughter, Emergency Contact  [phone]    Current Level of Care: Hospital Recommended Level of Care: Skilled Nursing Facility Prior Approval Number:    Date Approved/Denied:   PASRR Number: [id number]  Discharge Plan: SNF    Current Diagnoses: Patient Active Problem List   Diagnosis Date Noted   Acute exacerbation of CHF (congestive heart failure) (HCC) 06/14/2024   Neck swelling 06/14/2024   Influenza A 06/14/2024   Asthma exacerbation 06/14/2024   Bilateral shoulder pain 08/27/2022   Secondary osteoarthritis of shoulders, bilateral 08/27/2022   Chest pain 09/15/2017   Primary localized osteoarthritis of right knee 07/21/2017   First degree AV block 11/23/2013   Diabetes mellitus without complication (HCC)    Schizophrenia (HCC)    Hypertension    Dementia (HCC)    Depression    ACE inhibitor-aggravated angioedema    Diverticulosis     Orientation RESPIRATION BLADDER Height & Weight     Self  Normal Incontinent, External catheter Weight: 191 lb 2.2 oz (86.7 kg) Height:  5' 7 (170.2 cm)  BEHAVIORAL SYMPTOMS/MOOD NEUROLOGICAL BOWEL NUTRITION STATUS      Continent Diet (Please see dc summary)  AMBULATORY STATUS COMMUNICATION OF NEEDS Skin     Verbally Normal                       Personal Care Assistance Level of Assistance  Bathing, Feeding, Dressing Bathing Assistance:  (Please see dc summary) Feeding assistance:  (Please see dc summary) Dressing Assistance:  (Please see dc  summary)     Functional Limitations Info  Sight, Hearing, Speech Sight Info: Adequate Hearing Info: Adequate Speech Info: Adequate    SPECIAL CARE FACTORS FREQUENCY                       Contractures Contractures Info: Not present    Additional Factors Info  Code Status, Allergies, Psychotropic, Insulin  Sliding Scale, Isolation Precautions Code Status Info: Full Allergies Info: Shellfish Allergy, Ace Inhibitors, Angiotensin Receptor Blockers, Dust Mite Extract, Lactose Intolerance (Gi), Lisinopril, Strawberry Extract, Robaxin (Methocarbamol) Psychotropic Info: SEROQUEL  Insulin  Sliding Scale Info: Please see dc summary Isolation Precautions Info: Droplet/con pre     Current Medications (06/15/2024):  This is the current hospital active medication list Current Facility-Administered Medications  Medication Dose Route Frequency Provider Last Rate Last Admin   acetaminophen  (TYLENOL ) tablet 650 mg  650 mg Oral Q6H PRN Alfornia Madison, MD       Or   acetaminophen  (TYLENOL ) suppository 650 mg  650 mg Rectal Q6H PRN Alfornia Madison, MD       acetaminophen  (TYLENOL ) tablet 1,000 mg  1,000 mg Oral TID Will Almarie MATSU, MD   1,000 mg at 06/15/24 1051   albuterol  (PROVENTIL ) (2.5 MG/3ML) 0.083% nebulizer solution 2.5 mg  2.5 mg Nebulization Q2H PRN Alfornia Madison, MD       Ampicillin -Sulbactam (UNASYN ) 3 g in sodium chloride  0.9 % 100 mL IVPB  3 g Intravenous Q6H Nicholaus Quarry, RPH 200  mL/hr at 06/15/24 1123 3 g at 06/15/24 1123   benztropine  (COGENTIN ) tablet 1 mg  1 mg Oral Daily Mathews, Elizabeth G, MD   1 mg at 06/15/24 1056   budesonide  (PULMICORT ) nebulizer solution 0.25 mg  0.25 mg Nebulization BID Rathore, Vasundhra, MD   0.25 mg at 06/15/24 9192   citalopram  (CELEXA ) tablet 20 mg  20 mg Oral Daily Mathews, Elizabeth G, MD   20 mg at 06/15/24 1051   dexamethasone  (DECADRON ) injection 6 mg  6 mg Intravenous Q24H Rathore, Vasundhra, MD   6 mg at 06/14/24 2124    doxycycline  (VIBRA -TABS) tablet 100 mg  100 mg Oral Q12H Arrien, Elidia Sieving, MD   100 mg at 06/15/24 1051   furosemide  (LASIX ) injection 40 mg  40 mg Intravenous BID Rathore, Vasundhra, MD   40 mg at 06/15/24 1051   insulin  aspart (novoLOG ) injection 0-9 Units  0-9 Units Subcutaneous Q4H Alfornia Madison, MD   1 Units at 06/15/24 0451   ipratropium-albuterol  (DUONEB) 0.5-2.5 (3) MG/3ML nebulizer solution 3 mL  3 mL Nebulization Q6H Alfornia Madison, MD   3 mL at 06/15/24 9192   levothyroxine  (SYNTHROID ) tablet 50 mcg  50 mcg Oral Daily Mathews, Elizabeth G, MD   50 mcg at 06/15/24 0451   metoprolol  tartrate (LOPRESSOR ) injection 5 mg  5 mg Intravenous Q6H PRN Alfornia Madison, MD       oseltamivir  (TAMIFLU ) capsule 30 mg  30 mg Oral BID Rathore, Vasundhra, MD   30 mg at 06/15/24 1056   potassium chloride  SA (KLOR-CON  M) CR tablet 40 mEq  40 mEq Oral Q4H Arrien, Mauricio Daniel, MD   40 mEq at 06/15/24 1051   QUEtiapine  (SEROQUEL ) tablet 100 mg  100 mg Oral QHS Will Almarie MATSU, MD   100 mg at 06/14/24 2124   traMADol  (ULTRAM ) tablet 50 mg  50 mg Oral Q8H PRN Mathews, Elizabeth G, MD         Discharge Medications: Please see discharge summary for a list of discharge medications.  Relevant Imaging Results:  Relevant Lab Results:   Additional Information SSN: [id number]  Luise JAYSON Pan, LCSWA     "

## 2024-06-15 NOTE — Assessment & Plan Note (Signed)
 Continue bronchodilator therapy and inhaled steroids

## 2024-06-15 NOTE — Assessment & Plan Note (Signed)
 Continue glucose cover and monitoring with insulin  sliding scale Hold on systemic steroids

## 2024-06-15 NOTE — Assessment & Plan Note (Signed)
 Renal function with serum cr at 0,95 with K at 3,3 and serum bicarbonate at 20  Na 137  Plan to add Kcl po 40 meq x2 Follow up renal function and electrolytes

## 2024-06-15 NOTE — Assessment & Plan Note (Signed)
 No agitation, her daughter is at the bedside  Continue citalopram , quetiapine 

## 2024-06-15 NOTE — Hospital Course (Addendum)
 89 year old female from [facility] nursing [facility] with multiple comorbidities admitted with swelling of the left side of the neck/face with CT showing lower left facial swelling with subcutaneous induration without abscess or drainable fluid collection without identifiable infectious source.  ED physician discussed with Dr. Roark from ENT recommended IV Decadron  and antibiotics for cellulitis and admit the patient to [facility].  Patient is in [place] ER waiting to be transferred to [facility].  Continue Unasyn  and Decadron . Her outpatient medications have been verified with the pharmacy and I will restart them CT NECK Moderate lower left facial swelling with subcutaneous induration without abscess or drainable fluid collection, and without an identifiable infectious source. Chest x-ray Small bilateral pleural effusions.Minimal bibasilar atelectasis.Mild cardiomegaly. End-stage degenerative changes of both shoulders, right worse than left. Patient seen in the ED daughter at bedside.  She appears comfortable on room air.   Elevated BNP started on Lasix  twice daily was on Lasix  prior to admission.  Monitor renal functions.  EKG 65 bpm, normal axis, normal intervals, qtc 453, atrial fibrillation rhythm with no significant ST segment or T wave changes.

## 2024-06-15 NOTE — Plan of Care (Signed)

## 2024-06-15 NOTE — Assessment & Plan Note (Signed)
 Continue levothyroxine 

## 2024-06-15 NOTE — Assessment & Plan Note (Signed)
 Echocardiogram with preserved LV systolic function with EF 55 to 60% RV systolic function preserved, LA with mild to moderate dilatation, no significant valvular dysfunction.   Improved volume status  Will add metoprolol  for rate control atrial fibrillation Hold on guideline directed medical therapy due to patient frailty

## 2024-06-15 NOTE — Assessment & Plan Note (Signed)
 Elevated risk of thrombosis, patient in wheelchair.  Will follow up on telemetry Add anticoagulation with apixaban 

## 2024-06-15 NOTE — Assessment & Plan Note (Signed)
 Continue blood pressure monitoring  Continue metoprolol  50 mg succinate bid .

## 2024-06-15 NOTE — Assessment & Plan Note (Addendum)
 Wbc 3,65, cultures with no growth Clinically improved  Plan to continue IV Unasyn  and add po doxycycline  discontinue dexamethasone   Follow up clinical response in 24 hrs

## 2024-06-15 NOTE — Progress Notes (Signed)
 " Progress Note   Patient: Bonnie Watkins FMW:[id number] DOB: July 28, 1934 DOA: 06/13/2024     1 DOS: the patient was seen and examined on 06/15/2024   Brief hospital course: 89 year old female from [facility] nursing home with multiple comorbidities admitted with swelling of the left side of the neck/face with CT showing lower left facial swelling with subcutaneous induration without abscess or drainable fluid collection without identifiable infectious source.  ED physician discussed with Dr. Roark from ENT recommended IV Decadron  and antibiotics for cellulitis and admit the patient to [facility].  Patient is in [place] ER waiting to be transferred to [facility].  Continue Unasyn  and Decadron . Her outpatient medications have been verified with the pharmacy and I will restart them CT NECK Moderate lower left facial swelling with subcutaneous induration without abscess or drainable fluid collection, and without an identifiable infectious source. Chest x-ray Small bilateral pleural effusions.Minimal bibasilar atelectasis.Mild cardiomegaly. End-stage degenerative changes of both shoulders, right worse than left. Patient seen in the ED daughter at bedside.  She appears comfortable on room air.   Elevated BNP started on Lasix  twice daily was on Lasix  prior to admission.  Monitor renal functions.  Assessment and Plan: * Acute bacterial sialadenitis Wbc 3,65, cultures with no growth Clinically improved  Plan to continue IV Unasyn  and add po doxycycline  discontinue dexamethasone   Follow up clinical response in 24 hrs   Influenza A Continue with oseltamivir    Acute exacerbation of CHF (congestive heart failure) (HCC) Echocardiogram with preserved LV systolic function with EF 55 to 60% RV systolic function preserved, LA with mild to moderate dilatation, no significant valvular dysfunction.   Improved volume status   Essential hypertension Continue blood pressure monitoring   Paroxysmal atrial  fibrillation (HCC) Elevated risk of thrombosis, patient in wheelchair.  Will follow up on telemetry Add anticoagulation with apixaban    Hypokalemia Renal function with serum cr at 0,95 with K at 3,3 and serum bicarbonate at 20  Na 137  Plan to add Kcl po 40 meq x2 Follow up renal function and electrolytes   Type 2 diabetes mellitus with hyperlipidemia (HCC) Continue glucose cover and monitoring with insulin  sliding scale Hold on systemic steroids  Asthma exacerbation Continue bronchodilator therapy and inhaled steroids   Dementia (HCC) No agitation, her daughter is at the bedside  Continue citalopram , quetiapine   Hypothyroidism Continue levothyroxine         Subjective: Patient is feeling better, submandibular erythema and pain has improved, no dyspnea or dysphagia   Physical Exam: Vitals:   06/15/24 0211 06/15/24 0454 06/15/24 0727 06/15/24 0809  BP:   (!) 157/81   Pulse:   80   Resp:   19   Temp:  97.6 F (36.4 C) (!) 97.4 F (36.3 C)   TempSrc:  Oral Oral   SpO2: 97%  98% 99%  Weight:  86.7 kg    Height:       Neurology awake and alert ENT with mild pallor, mild erythema, no stridor  Cardiovascular with S1 and S2 present and regular with no gallops or rubs Respiratory with no rales or wheezing Abdomen with no distention  No lower extremity edema  Data Reviewed:    Family Communication: I spoke with patient's daughter at the bedside, we talked in detail about patient's condition, plan of care and prognosis and all questions were addressed.   Disposition: Status is: Inpatient Remains inpatient appropriate because: IV antibiotic therapy   Planned Discharge Destination: Home     Author: Elidia Sieving  Ivory Maduro, MD 06/15/2024 10:37 AM  For on call review [email or web address].  "

## 2024-06-16 ENCOUNTER — Telehealth (HOSPITAL_COMMUNITY): Payer: Self-pay | Admitting: Pharmacy Technician

## 2024-06-16 ENCOUNTER — Other Ambulatory Visit (HOSPITAL_COMMUNITY): Payer: Self-pay

## 2024-06-16 LAB — BASIC METABOLIC PANEL WITH GFR
Anion gap: 12 (ref 5–15)
BUN: 18 mg/dL (ref 8–23)
CO2: 26 mmol/L (ref 22–32)
Calcium: 8.3 mg/dL — ABNORMAL LOW (ref 8.9–10.3)
Chloride: 98 mmol/L (ref 98–111)
Creatinine, Ser: 0.97 mg/dL (ref 0.44–1.00)
GFR, Estimated: 57 mL/min — ABNORMAL LOW
Glucose, Bld: 138 mg/dL — ABNORMAL HIGH (ref 70–99)
Potassium: 3.5 mmol/L (ref 3.5–5.1)
Sodium: 135 mmol/L (ref 135–145)

## 2024-06-16 LAB — GLUCOSE, CAPILLARY
Glucose-Capillary: 116 mg/dL — ABNORMAL HIGH (ref 70–99)
Glucose-Capillary: 124 mg/dL — ABNORMAL HIGH (ref 70–99)
Glucose-Capillary: 134 mg/dL — ABNORMAL HIGH (ref 70–99)
Glucose-Capillary: 138 mg/dL — ABNORMAL HIGH (ref 70–99)
Glucose-Capillary: 147 mg/dL — ABNORMAL HIGH (ref 70–99)
Glucose-Capillary: 155 mg/dL — ABNORMAL HIGH (ref 70–99)

## 2024-06-16 LAB — TSH: TSH: 0.752 u[IU]/mL (ref 0.350–4.500)

## 2024-06-16 MED ORDER — AMOXICILLIN-POT CLAVULANATE 875-125 MG PO TABS
1.0000 | ORAL_TABLET | Freq: Two times a day (BID) | ORAL | Status: DC
  Administered 2024-06-16 – 2024-06-17 (×2): 1 via ORAL
  Filled 2024-06-16 (×4): qty 1

## 2024-06-16 MED ORDER — METOPROLOL TARTRATE 12.5 MG HALF TABLET
12.5000 mg | ORAL_TABLET | Freq: Two times a day (BID) | ORAL | Status: DC
  Administered 2024-06-16 – 2024-06-17 (×3): 12.5 mg via ORAL
  Filled 2024-06-16 (×3): qty 1

## 2024-06-16 MED ORDER — POTASSIUM CHLORIDE CRYS ER 20 MEQ PO TBCR
40.0000 meq | EXTENDED_RELEASE_TABLET | Freq: Once | ORAL | Status: AC
  Administered 2024-06-16: 40 meq via ORAL
  Filled 2024-06-16: qty 2

## 2024-06-16 NOTE — Telephone Encounter (Signed)
 Patient Product/process development scientist completed.    The patient is insured through [facility]. Patient has Medicare and is not eligible for a copay card, but may be able to apply for patient assistance or Medicare RX Payment Plan (Patient Must reach out to their plan, if eligible for payment plan), if available.    Ran test claim for Eliquis 5 mg and the current 30 day co-pay is $0.00.   This test claim was processed through [facility]- copay amounts may vary at other pharmacies due to pharmacy/plan contracts, or as the patient moves through the different stages of their insurance plan.     Reyes Sharps, CPHT Pharmacy Technician Patient Advocate Specialist Lead [facility] Patient Advocate Team Direct Number: [phone]  Fax: [phone]

## 2024-06-16 NOTE — TOC Progression Note (Signed)
 Transition of Care [facility]) - Progression Note    Patient Details  Name: Bonnie Watkins MRN: [id number] Date of Birth: 27-Apr-1935  Transition of Care [facility]) CM/SW Contact  Luise JAYSON Pan, [place] Phone Number: 06/16/2024, 1:44 PM  Clinical Narrative:   CSW returned call from [place]. Therisa asked for CSWs permission to add her sister, Noemi, to the call. CSW stated yes. Family and CSW discussed families concerns of patient returning to [facility]. CSW stated that CSW has faxed patient out to other facilities but has received no acceptances at this time. CSW reviewed the denials with family. CSW explained that CSW will continue to work on this but if patient is medically stable before CSW can locate a facility, patient will have to return to [facility] and the facility can assist with looking at other facilities. Therisa and Noemi expressed their understanding. Noemi asked for a list of facilities CSW has reached out to and provided her email [email or web address]. CSW emailed Noemi the facilities.   CSW will continue to follow.    Expected Discharge Plan: Skilled Nursing Facility Barriers to Discharge: Continued Medical Work up               Expected Discharge Plan and Services In-house Referral: Clinical Social Work     Living arrangements for the past 2 months: Skilled Nursing Facility                                       Social Drivers of Health (SDOH) Interventions SDOH Screenings   Food Insecurity: No Food Insecurity (06/14/2024)  Housing: Unknown (06/14/2024)  Transportation Needs: No Transportation Needs (06/14/2024)  Utilities: Not At Risk (06/14/2024)  Social Connections: Unknown (06/14/2024)  Tobacco Use: Medium Risk (12/11/2023)    Readmission Risk Interventions     No data to display

## 2024-06-16 NOTE — Progress Notes (Signed)
 " Progress Note   Patient: Bonnie Watkins FMW:[id number] DOB: 03/19/1935 DOA: 06/13/2024     2 DOS: the patient was seen and examined on 06/16/2024   Brief hospital course: 89 year old female from [facility] nursing home with multiple comorbidities admitted with swelling of the left side of the neck/face with CT showing lower left facial swelling with subcutaneous induration without abscess or drainable fluid collection without identifiable infectious source.  ED physician discussed with Dr. Roark from ENT recommended IV Decadron  and antibiotics for cellulitis and admit the patient to [facility].  Patient is in [place] ER waiting to be transferred to [facility].  Continue Unasyn  and Decadron . Her outpatient medications have been verified with the pharmacy and I will restart them CT NECK Moderate lower left facial swelling with subcutaneous induration without abscess or drainable fluid collection, and without an identifiable infectious source. Chest x-ray Small bilateral pleural effusions.Minimal bibasilar atelectasis.Mild cardiomegaly. End-stage degenerative changes of both shoulders, right worse than left. Patient seen in the ED daughter at bedside.  She appears comfortable on room air.   Elevated BNP started on Lasix  twice daily was on Lasix  prior to admission.  Monitor renal functions.  EKG 65 bpm, normal axis, normal intervals, qtc 453, atrial fibrillation rhythm with no significant ST segment or T wave changes.   Assessment and Plan: * Acute bacterial sialadenitis No leukocytosis or fever, rash has resolved.   Patient initially on IV Unasyn , will change to Augmentin , continue with po doxycycline  Holding further dexamethasone    Influenza A Continue with oseltamivir   No signs of viral pneumonia   Acute exacerbation of CHF (congestive heart failure) (HCC) Echocardiogram with preserved LV systolic function with EF 55 to 60% RV systolic function preserved, LA with mild to moderate  dilatation, no significant valvular dysfunction.   Improved volume status  Will add metoprolol  for rate control atrial fibrillation Hold on guideline directed medical therapy due to patient frailty   Essential hypertension Continue blood pressure monitoring  Added low dose metprolol  Paroxysmal atrial fibrillation (HCC) Elevated risk of thrombosis, patient in wheelchair.  Telemetry with persistent atrial fibrillation Herat rate up to 100,  Will add metoprolol  low dose and continue  anticoagulation with apixaban    Hypokalemia Follow up renal function with serum cr at 0,97 with K at 3,5 and serum bicarbonate ata 26  Na 135  Continue follow up renal function and electrolytes   Type 2 diabetes mellitus with hyperlipidemia (HCC) Continue glucose cover and monitoring with insulin  sliding scale Hold on systemic steroids  Asthma exacerbation Continue bronchodilator therapy and inhaled steroids   Dementia (HCC) No agitation, her daughter is at the bedside  Continue citalopram , quetiapine   Hypothyroidism TSH within normal limits  Continue levothyroxine         Subjective: Patient is feeling better, no chest pain and no palpitations, she is not ambulatory at baseline   Physical Exam: Vitals:   06/16/24 0437 06/16/24 0713 06/16/24 0748 06/16/24 1057  BP: 132/63 (!) 140/80  (!) 123/59  Pulse: 81 82 86 81  Resp: 16 17  17   Temp: 97.9 F (36.6 C) 97.9 F (36.6 C)  98.8 F (37.1 C)  TempSrc: Oral Oral  Oral  SpO2:  99%  97%  Weight: 87.3 kg     Height:       Neurology awake and alert, deconditioned, positive baseline cognitive impairment  ENT with mild pallor, no neck erythema or edema, no increased local temperature Cardiovascular with S1 and S2 present, irregularly irregular with no  gallops or rubs, No JVD Respiratory with poor inspiratory effort with no wheezing or rhonchi Abdomen with no distention  No lower extremity edema  Data Reviewed:    Family  Communication: I spoke with patient's daughter at the bedside, we talked in detail about patient's condition, plan of care and prognosis and all questions were addressed.   Disposition: Status is: Inpatient Remains inpatient appropriate because: atrial fibrillation management   Planned Discharge Destination: Skilled nursing facility     Author: Elidia Toribio Furnace, MD 06/16/2024 1:28 PM  For on call review [email or web address].  "

## 2024-06-16 NOTE — Discharge Instructions (Signed)

## 2024-06-17 LAB — CBC
HCT: 30.2 % — ABNORMAL LOW (ref 36.0–46.0)
Hemoglobin: 9.5 g/dL — ABNORMAL LOW (ref 12.0–15.0)
MCH: 27 pg (ref 26.0–34.0)
MCHC: 31.5 g/dL (ref 30.0–36.0)
MCV: 85.8 fL (ref 80.0–100.0)
Platelets: 250 10*3/uL (ref 150–400)
RBC: 3.52 MIL/uL — ABNORMAL LOW (ref 3.87–5.11)
RDW: 14.6 % (ref 11.5–15.5)
WBC: 10.3 10*3/uL (ref 4.0–10.5)
nRBC: 0 % (ref 0.0–0.2)

## 2024-06-17 LAB — BASIC METABOLIC PANEL WITH GFR
Anion gap: 9 (ref 5–15)
BUN: 18 mg/dL (ref 8–23)
CO2: 26 mmol/L (ref 22–32)
Calcium: 8 mg/dL — ABNORMAL LOW (ref 8.9–10.3)
Chloride: 102 mmol/L (ref 98–111)
Creatinine, Ser: 1.12 mg/dL — ABNORMAL HIGH (ref 0.44–1.00)
GFR, Estimated: 47 mL/min — ABNORMAL LOW
Glucose, Bld: 117 mg/dL — ABNORMAL HIGH (ref 70–99)
Potassium: 3.9 mmol/L (ref 3.5–5.1)
Sodium: 136 mmol/L (ref 135–145)

## 2024-06-17 LAB — GLUCOSE, CAPILLARY
Glucose-Capillary: 121 mg/dL — ABNORMAL HIGH (ref 70–99)
Glucose-Capillary: 145 mg/dL — ABNORMAL HIGH (ref 70–99)
Glucose-Capillary: 125 mg/dL — ABNORMAL HIGH (ref 70–99)

## 2024-06-17 MED ORDER — AMLODIPINE BESYLATE 10 MG PO TABS: 10.0000 mg | ORAL_TABLET | Freq: Every day | ORAL | Status: AC

## 2024-06-17 MED ORDER — AMOXICILLIN-POT CLAVULANATE 875-125 MG PO TABS: 1.0000 | ORAL_TABLET | Freq: Two times a day (BID) | ORAL | 0 refills | Status: AC

## 2024-06-17 MED ORDER — IPRATROPIUM-ALBUTEROL 0.5-2.5 (3) MG/3ML IN SOLN: RESPIRATORY_TRACT | Status: AC

## 2024-06-17 MED ORDER — ACETAMINOPHEN 500 MG PO TABS: 1000.0000 mg | ORAL_TABLET | Freq: Three times a day (TID) | ORAL | Status: AC

## 2024-06-17 MED ORDER — DOXYCYCLINE HYCLATE 100 MG PO TABS: 100.0000 mg | ORAL_TABLET | Freq: Two times a day (BID) | ORAL | 0 refills | Status: AC

## 2024-06-17 MED ORDER — OSELTAMIVIR PHOSPHATE 30 MG PO CAPS: 30.0000 mg | ORAL_CAPSULE | Freq: Two times a day (BID) | ORAL | 0 refills | Status: AC

## 2024-06-17 MED ORDER — BENZONATATE 100 MG PO CAPS: 100.0000 mg | ORAL_CAPSULE | Freq: Three times a day (TID) | ORAL | Status: AC | PRN

## 2024-06-17 MED ORDER — LIDOCAINE 4 % EX PTCH: 1.0000 | MEDICATED_PATCH | CUTANEOUS | 0 refills | Status: AC

## 2024-06-17 MED ORDER — APIXABAN 5 MG PO TABS: 5.0000 mg | ORAL_TABLET | Freq: Two times a day (BID) | ORAL | Status: AC

## 2024-06-17 MED ORDER — TRAMADOL-ACETAMINOPHEN 37.5-325 MG PO TABS: 1.0000 | ORAL_TABLET | Freq: Four times a day (QID) | ORAL | 0 refills | Status: AC

## 2024-06-17 MED ORDER — IPRATROPIUM-ALBUTEROL 0.5-2.5 (3) MG/3ML IN SOLN: 3.0000 mL | Freq: Two times a day (BID) | RESPIRATORY_TRACT | Status: DC

## 2024-06-17 NOTE — TOC Progression Note (Addendum)
 Transition of Care [facility]) - Progression Note    Patient Details  Name: Bonnie Watkins MRN: [id number] Date of Birth: 09-Oct-1934  [facility] CM/SW Contact  Luise JAYSON Pan, [place] Phone Number: 06/17/2024, 11:55 AM  Clinical Narrative:   [facility] potentially interested in patient for ltc. Facility is running a development worker, international aid. CSW followed up with Therisa and Noemi and informed them of [facility]'s potential interest but informed that that they have not officially accepted patient. CSW explained that patient can discharge back to [facility] and the facility can follow up. Family expressed their understanding.   2:47 PM [facility] informed Csw that they would be able to accept patient Monday due to patient having the flu. CSW will notify Jame.  Expected Discharge Plan: Skilled Nursing Facility Barriers to Discharge: Continued Medical Work up               Expected Discharge Plan and Services In-house Referral: Clinical Social Work     Living arrangements for the past 2 months: Skilled Nursing Facility                                       Social Drivers of Health (SDOH) Interventions SDOH Screenings   Food Insecurity: No Food Insecurity (06/14/2024)  Housing: Unknown (06/14/2024)  Transportation Needs: No Transportation Needs (06/14/2024)  Utilities: Not At Risk (06/14/2024)  Social Connections: Unknown (06/14/2024)  Tobacco Use: Medium Risk (12/11/2023)    Readmission Risk Interventions     No data to display

## 2024-06-17 NOTE — Discharge Summary (Signed)
 Physician Discharge Summary  Bonnie Watkins FMW:[id number] DOB: [birth year] DOA: 06/13/2024  PCP: [facility]  Admit date: 06/13/2024 Discharge date: 06/17/2024  Time spent: 60 minutes  Recommendations for Outpatient Follow-up:  Follow-up with MD at skilled nursing facility.  Patient will need a basic metabolic profile done in 1 week to follow-up on electrolytes and renal function.   Discharge Diagnoses:  Principal Problem:   Acute bacterial sialadenitis Active Problems:   Influenza A   Acute exacerbation of CHF (congestive heart failure) (HCC)   Essential hypertension   Paroxysmal atrial fibrillation (HCC)   Hypokalemia   Type 2 diabetes mellitus with hyperlipidemia (HCC)   Asthma exacerbation   Dementia (HCC)   Hypothyroidism   Discharge Condition: Stable and improved.  Diet recommendation: Heart healthy  Filed Weights   06/15/24 0454 06/16/24 0437 06/17/24 0320  Weight: 86.7 kg 87.3 kg 88.4 kg    History of present illness:  HPI per Dr.Rathore Bonnie Watkins is a 89 y.o. female with medical history significant of ACE inhibitor induced angioedema, dementia, depression, insulin -dependent type 2 diabetes, diverticulosis, GERD, hypertension, asthma, osteoporosis, schizophrenia, history of PACs/atrial tachycardia presented to the ED from her nursing facility for evaluation of shortness of breath and neck swelling.  Vital signs stable on arrival. Labs showing no leukocytosis, hemoglobin 9.0 (previously 12-13 on labs done over 5 years ago), MCV 90.5, glucose 111, influenza A PCR positive, proBNP 6104. Chest x-ray showing small bilateral pleural effusions, minimal bibasilar atelectasis, minimal cardiomegaly, and end-stage degenerative changes of both shoulders (right worse than left). CT soft tissue neck showing moderate lower left facial swelling with subcutaneous induration without abscess or drainable fluid collection and without an identifiable  infectious source. EKG showing A-fib and no STEMI. ED physician discussed the case with ENT (Dr. Roark) who reviewed the patient's CT scan and recommended treating with IV Decadron  at this time and also antibiotics for cellulitis. Recommending admitting to [facility] and ENT will consult. Patient was given IV Decadron  6 mg, Tamiflu , and doxycycline .  [facility] called to admit.   History provided by the patient and her daughter at bedside.  Daughter is reporting patient having chronic shortness of breath due to history of asthma and she uses supplemental oxygen at night although unclear how many liters.  Daughter states 4 days ago patient had hoarse voice and was evaluated by a physician at her facility who felt that she had laryngitis.  Daughter could not go back to the facility over the weekend due to snow but when she went to check on the patient yesterday, she noticed that her neck was swollen.  No fevers reported.  Patient denies any shortness of breath at this time.  Denies chest pain.  Denies hematemesis, hematochezia, or melena.  Daughter does not know which medications patient is receiving at her facility.  Hospital Course:  * Acute bacterial sialadenitis No leukocytosis or fever, rash has resolved.  -On admission patient seen in consultation by ENT who recommended IV Decadron  and antibiotics, massage heat and hydration and it was felt patient does not appear to have a stone and should resolve without intervention.  Patient initially on IV Unasyn , and subsequently transitioned to Augmentin  as well as maintained on oral doxycycline .   - Patient noted to have received a dose of dexamethasone  which was subsequently discontinued - Patient improved clinically was subsequently transition to Augmentin  and oral doxycycline . - Patient will discharge on 7 more days of antibiotics to complete a 10-day course  of treatment.   Influenza A Patient received oseltamivir  during the hospitalization and being discharged  on 1 more day to complete course of treatment.   Acute exacerbation of CHF (congestive heart failure) (HCC) Echocardiogram with preserved LV systolic function with EF 55 to 60% RV systolic function preserved, LA with mild to moderate dilatation, no significant valvular dysfunction.    Improved volume status  Metoprolol  was resumed for patient's rate control on A-fib and patient be discharged back on home regimen Toprol -XL.  - Patient's home regimen Lasix  will be resumed on discharge. Held on guideline directed medical therapy due to patient frailty    Essential hypertension -On admission patient's antihypertensive medications were initially held.   - Patient subsequently started on low-dose metoprolol .   - Patient was discharged back on home regimen of Toprol -XL.   - Patient's home regimen Norvasc  to be resumed 3 days postdischarge.   - Outpatient follow-up.   Paroxysmal atrial fibrillation (HCC) Elevated risk of thrombosis, patient in wheelchair.  Telemetry with persistent atrial fibrillation Patient has beta-blocker initially held on admission patient started on metoprolol  low-dose and dose uptitrated for better heart rate control.  - Home regimen Toprol -XL will be resumed on discharge. - Due to patient's increased risk of thrombosis patient started on apixaban  during the hospitalization which will be discharged on. -Outpatient follow-up.   Hypokalemia Repleted during the hospitalization.   - Outpatient follow-up.     Type 2 diabetes mellitus with hyperlipidemia (HCC) Hemoglobin A1c noted at 6.4.   -Patient maintained on sliding scale insulin  during the hospitalization.  - Outpatient follow-up.   Asthma exacerbation Patient maintained on bronchodilator therapy and inhaled steroids. - Outpatient follow-up.   Dementia (HCC) No agitation noted during the hospitalization, daughter noted at bedside. Patient maintained on home regimen citalopram , quetiapine . - Outpatient  follow-up.   Hypothyroidism TSH within normal limits  Patient maintained on home regimen levothyroxine . - Outpatient follow-up.      Procedures: CT soft tissue neck 06/13/2024 Chest x-ray 06/13/2024 2D echo 06/14/2024   Consultations: ENT: Dr. Roark 06/14/2024  Discharge Exam: Vitals:   06/17/24 0751 06/17/24 1050  BP: 138/79 (!) 148/69  Pulse: 72 80  Resp: 16 17  Temp: 98.4 F (36.9 C)   SpO2: 93% 97%    General: NAD Cardiovascular: Irregularly irregular.  No murmurs rubs or gallops.  No JVD.  No pitting lower extremity edema. Respiratory: Some coarse diffuse breath sounds.  No wheezing.  No significant crackles noted.  Speaking in full sentences.  No use of accessory muscles of respiration.  Discharge Instructions   Discharge Instructions     Diet - low sodium heart healthy   Complete by: As directed    Increase activity slowly   Complete by: As directed       Allergies as of 06/17/2024       Reactions   Shellfish Allergy Anaphylaxis   ALL SEAFOOD ALLERGY   Ace Inhibitors Other (See Comments)   Allergy not listed on MAR    Angiotensin Receptor Blockers Other (See Comments)   Allergy not listed on MAR    Dust Mite Extract Other (See Comments)   Reaction not listed on MAR    Lactose Intolerance (gi) Other (See Comments)   Reaction not listed on MAR    Lisinopril Other (See Comments)   Angioedema. Allergy not listed on MAR    Strawberry Extract Hives   Robaxin [methocarbamol] Rash        Medication List  PAUSE taking these medications    Lantus  100 UNIT/ML injection Wait to take this until your doctor or other care provider tells you to start again. Generic drug: insulin  glargine INJECT 10 UNITS SUBCUTANEOUSLY EACH MORNING.       STOP taking these medications    guaiFENesin  600 MG 12 hr tablet Commonly known as: MUCINEX        TAKE these medications    benztropine  1 MG tablet Commonly known as: COGENTIN  Take 1 mg by mouth daily. The  timing of this medication is very important.   acetaminophen  500 MG tablet Commonly known as: TYLENOL  Take 2 tablets (1,000 mg total) by mouth 3 (three) times daily for 7 days.   alendronate  70 MG tablet Commonly known as: FOSAMAX  TAKE 1 TAB EACH WEEK 30 MIN PRIOR TO BREAKFAST WITH LARGE GLASS OF WATER. REMAIN UPRIGHT. What changed: See the new instructions.   amLODipine  10 MG tablet Commonly known as: NORVASC  Take 1 tablet (10 mg total) by mouth daily. Start taking on: June 20, 2024 What changed: These instructions start on June 20, 2024. If you are unsure what to do until then, ask your doctor or other care provider.   amoxicillin -clavulanate 875-125 MG tablet Commonly known as: AUGMENTIN  Take 1 tablet by mouth every 12 (twelve) hours for 6 days.   apixaban  5 MG Tabs tablet Commonly known as: ELIQUIS  Take 1 tablet (5 mg total) by mouth 2 (two) times daily.   benzonatate  100 MG capsule Commonly known as: TESSALON  Take 1 capsule (100 mg total) by mouth 3 (three) times daily as needed for cough. What changed:  when to take this reasons to take this   citalopram  20 MG tablet Commonly known as: CELEXA  Take 20 mg by mouth daily.   cyanocobalamin 1000 MCG tablet Commonly known as: VITAMIN B12 Take 1,000 mcg by mouth daily.   diclofenac Sodium 1 % Gel Commonly known as: VOLTAREN Apply 2 g topically in the morning, at noon, and at bedtime. Apply to shoulders, ankles, and knees   doxycycline  100 MG tablet Commonly known as: VIBRA -TABS Take 1 tablet (100 mg total) by mouth every 12 (twelve) hours for 6 days.   fluticasone-salmeterol 100-50 MCG/ACT Aepb Commonly known as: ADVAIR Inhale 1 puff into the lungs 2 (two) times daily.   furosemide  20 MG tablet Commonly known as: LASIX  Take 20 mg by mouth every Monday, Wednesday, and Friday.   gabapentin 100 MG capsule Commonly known as: NEURONTIN Take 100 mg by mouth 3 (three) times daily.   Glucerna Liqd Take 120  mLs by mouth as needed (low BS).   ipratropium-albuterol  0.5-2.5 (3) MG/3ML Soln Commonly known as: DUONEB Inhale 3 mLs into the lungs 2 (two) times daily for 4 days, THEN 3 mLs every 6 (six) hours as needed for up to 4 days (sob/wheezing). Start taking on: June 17, 2024 What changed: See the new instructions.   levothyroxine  50 MCG tablet Commonly known as: SYNTHROID  TAKE ONE TABLET BY MOUTH ONCE DAILY. What changed: when to take this   lidocaine  4 % Place 1 patch onto the skin daily. Apply to right shoulder   Linzess  72 MCG capsule Generic drug: linaclotide  TAKE (1) CAPSULE BY MOUTH DAILY BEFORE BREAKFAST. What changed: See the new instructions.   magnesium oxide 400 (240 Mg) MG tablet Commonly known as: MAG-OX Take 400 mg by mouth daily.   metFORMIN  1000 MG tablet Commonly known as: GLUCOPHAGE  TAKE (1) TABLET BY MOUTH TWICE DAILY. What changed: See the new instructions.  metoprolol  succinate 50 MG 24 hr tablet Commonly known as: TOPROL -XL Take 50 mg by mouth daily.   omeprazole 20 MG capsule Commonly known as: PRILOSEC Take 20 mg by mouth daily.   oseltamivir  30 MG capsule Commonly known as: TAMIFLU  Take 1 capsule (30 mg total) by mouth 2 (two) times daily for 1 day. What changed:  medication strength how much to take when to take this   perphenazine  2 MG tablet Commonly known as: TRILAFON  Take 2 mg by mouth daily.   Polyethyl Glyc-Propyl Glyc PF 0.4-0.3 % Soln Place 1 drop into both eyes in the morning and at bedtime.   Psyllium Fiber 0.52 g Caps TAKE (1) CAPSULE BY MOUTH THREE TIMES A DAY WITH FULL 8OZ OF WATER. What changed: See the new instructions.   QUEtiapine  100 MG tablet Commonly known as: SEROQUEL  Take 1 tablet (100 mg total) by mouth at bedtime.   Sodium Fluoride 1.1 % Pste Place 1 Application onto teeth every evening.   traMADol -acetaminophen  37.5-325 MG tablet Commonly known as: ULTRACET  Take 1 tablet by mouth in the morning, at  noon, in the evening, and at bedtime.       Allergies[1]  Contact information for follow-up providers     MD AT SNF Follow up.               Contact information for after-discharge care     Destination     [facility]   Service: Skilled Nursing Contact information: [address] [place]  [address] [phone]                      The results of significant diagnostics from this hospitalization (including imaging, microbiology, ancillary and laboratory) are listed below for reference.    Significant Diagnostic Studies: ECHOCARDIOGRAM COMPLETE Result Date: 06/14/2024    ECHOCARDIOGRAM REPORT   Patient Name:   SHANE MELBY Date of Exam: 06/14/2024 Medical Rec #:  [id number]                 Height:       64.0 in Accession #:    [id number]                Weight:       233.9 lb Date of Birth:  04-Oct-1934                 BSA:          2.091 m Patient Age:    89 years                  BP:           163/82 mmHg Patient Gender: F                         HR:           121 bpm. Exam Location:  Inpatient Procedure: 2D Echo, Cardiac Doppler and Color Doppler (Both Spectral and Color            Flow Doppler were utilized during procedure). Indications:    CHF - acute diastolic  History:        Patient has prior history of Echocardiogram examinations, most                 recent 09/16/2017. Risk Factors:Diabetes and Hypertension.  Sonographer:    Odella Brewster Referring Phys: [id number] EDITHA RAM  Sonographer Comments:  Technically difficult study due to poor echo windows. Image acquisition challenging due to patient body habitus and Image acquisition challenging due to respiratory motion. IMPRESSIONS  1. Left ventricular ejection fraction, by estimation, is 55 to 60%. The left ventricle has normal function. The left ventricle has no regional wall motion abnormalities. Left ventricular diastolic function could not be evaluated.  2.  Right ventricular systolic function is normal. The right ventricular size is normal. There is moderately elevated pulmonary artery systolic pressure. The estimated right ventricular systolic pressure is 58.3 mmHg.  3. Left atrial size was mild to moderately dilated.  4. The mitral valve is normal in structure. Mild mitral valve regurgitation. No evidence of mitral stenosis.  5. The aortic valve is tricuspid. Aortic valve regurgitation is not visualized. Aortic valve sclerosis/calcification is present, without any evidence of aortic stenosis.  6. The inferior vena cava is dilated in size with <50% respiratory variability, suggesting right atrial pressure of 15 mmHg. FINDINGS  Left Ventricle: Left ventricular ejection fraction, by estimation, is 55 to 60%. The left ventricle has normal function. The left ventricle has no regional wall motion abnormalities. The left ventricular internal cavity size was normal in size. There is  no left ventricular hypertrophy. Left ventricular diastolic function could not be evaluated due to atrial fibrillation. Left ventricular diastolic function could not be evaluated. Right Ventricle: The right ventricular size is normal. No increase in right ventricular wall thickness. Right ventricular systolic function is normal. There is moderately elevated pulmonary artery systolic pressure. The tricuspid regurgitant velocity is 3.29 m/s, and with an assumed right atrial pressure of 15 mmHg, the estimated right ventricular systolic pressure is 58.3 mmHg. Left Atrium: Left atrial size was mild to moderately dilated. Right Atrium: Right atrial size was normal in size. Pericardium: There is no evidence of pericardial effusion. Mitral Valve: The mitral valve is normal in structure. Mild mitral valve regurgitation. No evidence of mitral valve stenosis. MV peak gradient, 9.9 mmHg. The mean mitral valve gradient is 5.0 mmHg. Tricuspid Valve: The tricuspid valve is normal in structure. Tricuspid valve  regurgitation is mild . No evidence of tricuspid stenosis. Aortic Valve: The aortic valve is tricuspid. Aortic valve regurgitation is not visualized. Aortic valve sclerosis/calcification is present, without any evidence of aortic stenosis. Aortic valve mean gradient measures 5.0 mmHg. Aortic valve peak gradient measures 7.2 mmHg. Aortic valve area, by VTI measures 1.58 cm. Pulmonic Valve: The pulmonic valve was normal in structure. Pulmonic valve regurgitation is trivial. No evidence of pulmonic stenosis. Aorta: The aortic root, ascending aorta and aortic arch are all structurally normal, with no evidence of dilitation or obstruction. Venous: The inferior vena cava is dilated in size with less than 50% respiratory variability, suggesting right atrial pressure of 15 mmHg. IAS/Shunts: No atrial level shunt detected by color flow Doppler.  LEFT VENTRICLE PLAX 2D LVIDd:         4.70 cm     Diastology LVIDs:         2.90 cm     LV e' medial:    7.83 cm/s LV PW:         1.20 cm     LV E/e' medial:  13.3 LV IVS:        1.10 cm     LV e' lateral:   9.46 cm/s LVOT diam:     1.80 cm     LV E/e' lateral: 11.0 LV SV:         35 LV SV Index:  17 LVOT Area:     2.54 cm LV IVRT:       53 msec  LV Volumes (MOD) LV vol d, MOD A2C: 87.4 ml LV vol d, MOD A4C: 84.8 ml LV vol s, MOD A2C: 31.0 ml LV vol s, MOD A4C: 36.8 ml LV SV MOD A2C:     56.4 ml LV SV MOD A4C:     84.8 ml LV SV MOD BP:      51.8 ml RIGHT VENTRICLE            IVC RV S prime:     9.68 cm/s  IVC diam: 2.20 cm TAPSE (M-mode): 1.6 cm                            PULMONARY VEINS                            Diastolic Velocity: 64.30 cm/s                            S/D Velocity:       0.60                            Systolic Velocity:  38.55 cm/s LEFT ATRIUM           Index LA diam:      4.70 cm 2.25 cm/m LA Vol (A2C): 77.5 ml 37.06 ml/m LA Vol (A4C): 82.4 ml 39.41 ml/m  AORTIC VALVE                     PULMONIC VALVE AV Area (Vmax):    1.72 cm      PV Vmax:       1.45  m/s AV Area (Vmean):   1.50 cm      PV Peak grad:  8.4 mmHg AV Area (VTI):     1.58 cm AV Vmax:           134.50 cm/s AV Vmean:          104.500 cm/s AV VTI:            0.221 m AV Peak Grad:      7.2 mmHg AV Mean Grad:      5.0 mmHg LVOT Vmax:         91.05 cm/s LVOT Vmean:        61.600 cm/s LVOT VTI:          0.138 m LVOT/AV VTI ratio: 0.62  AORTA Ao Root diam: 3.20 cm Ao Asc diam:  3.30 cm MITRAL VALVE                TRICUSPID VALVE MV Area (PHT): 5.00 cm     TR Peak grad:   43.3 mmHg MV Area VTI:   1.56 cm     TR Vmax:        329.00 cm/s MV Peak grad:  9.9 mmHg MV Mean grad:  5.0 mmHg     SHUNTS MV Vmax:       1.57 m/s     Systemic VTI:  0.14 m MV Vmean:      108.0 cm/s   Systemic Diam: 1.80 cm MV E velocity: 104.00 cm/s Morene Brownie Electronically signed by Morene Brownie Signature Date/Time: 06/14/2024/10:17:52 AM    Final  CT Soft Tissue Neck W Contrast Result Date: 06/13/2024 EXAM: CT NECK WITH CONTRAST 06/13/2024 09:56:07 PM TECHNIQUE: CT of the neck was performed with the administration of 75 mL of iohexol  (OMNIPAQUE ) 300 MG/ML solution. Multiplanar reformatted images are provided for review. Automated exposure control, iterative reconstruction, and/or weight based adjustment of the mA/kV was utilized to reduce the radiation dose to as low as reasonably achievable. COMPARISON: None available. CLINICAL HISTORY: Soft tissue infection suspected, neck, xray done. Soft tissue infection suspected in the neck; X-ray done. FINDINGS: LIMITATIONS/ARTIFACTS: The examination is degraded by motion. AERODIGESTIVE TRACT: No discrete mass. No edema. SALIVARY GLANDS: The parotid and submandibular glands are unremarkable. THYROID : Unremarkable. LYMPH NODES: No suspicious cervical lymphadenopathy. SOFT TISSUES: Moderate lower left facial swelling with subcutaneous induration. No abscess or drainable fluid collection. No identifiable infectious source. BONES: No abnormality. OTHER: Visualized sinuses and mastoid air  cells are well aerated. Visualized lungs are clear. IMPRESSION: 1. Moderate lower left facial swelling with subcutaneous induration without abscess or drainable fluid collection, and without an identifiable infectious source. 2. Examination degraded by motion. Electronically signed by: Franky Stanford MD 06/13/2024 10:27 PM EST RP Workstation: [id number]   DG Chest 2 View Result Date: 06/13/2024 EXAM: 2 VIEW(S) XRAY OF THE CHEST 06/13/2024 08:09:00 PM COMPARISON: None available. CLINICAL HISTORY: cough. FINDINGS: LUNGS AND PLEURA: Minimal atelectasis in the lung bases. Small bilateral pleural effusions. No focal pulmonary opacity. No pneumothorax. HEART AND MEDIASTINUM: The heart is mildly enlarged. BONES AND SOFT TISSUES: End stage degenerative changes of both shoulders, right worse than left. IMPRESSION: 1. Small bilateral pleural effusions. 2. Minimal bibasilar atelectasis. 3. Mild cardiomegaly. 4. End-stage degenerative changes of both shoulders, right worse than left. Electronically signed by: Greig Pique MD 06/13/2024 08:23 PM EST RP Workstation: [id number]    Microbiology: Recent Results (from the past 240 hours)  Resp panel by RT-PCR (RSV, Flu A&B, Covid) Anterior Nasal Swab     Status: Abnormal   Collection Time: 06/13/24  8:36 PM   Specimen: Anterior Nasal Swab  Result Value Ref Range Status   SARS Coronavirus 2 by RT PCR NEGATIVE NEGATIVE Final    Comment: (NOTE) SARS-CoV-2 target nucleic acids are NOT DETECTED.  The SARS-CoV-2 RNA is generally detectable in upper respiratory specimens during the acute phase of infection. The lowest concentration of SARS-CoV-2 viral copies this assay can detect is 138 copies/mL. A negative result does not preclude SARS-Cov-2 infection and should not be used as the sole basis for treatment or other patient management decisions. A negative result may occur with  improper specimen collection/handling, submission of specimen other than nasopharyngeal swab,  presence of viral mutation(s) within the areas targeted by this assay, and inadequate number of viral copies(<138 copies/mL). A negative result must be combined with clinical observations, patient history, and epidemiological information. The expected result is Negative.  Fact Sheet for Patients:  bloggercourse.com  Fact Sheet for Healthcare Providers:  seriousbroker.it  This test is no t yet approved or cleared by the [place]  FDA and  has been authorized for detection and/or diagnosis of SARS-CoV-2 by FDA under an Emergency Use Authorization (EUA). This EUA will remain  in effect (meaning this test can be used) for the duration of the COVID-19 declaration under Section 564(b)(1) of the Act, 21 U.S.C.section 360bbb-3(b)(1), unless the authorization is terminated  or revoked sooner.       Influenza A by PCR POSITIVE (A) NEGATIVE Final   Influenza B by PCR NEGATIVE NEGATIVE Final    Comment: (  NOTE) The Xpert Xpress SARS-CoV-2/FLU/RSV plus assay is intended as an aid in the diagnosis of influenza from Nasopharyngeal swab specimens and should not be used as a sole basis for treatment. Nasal washings and aspirates are unacceptable for Xpert Xpress SARS-CoV-2/FLU/RSV testing.  Fact Sheet for Patients: bloggercourse.com  Fact Sheet for Healthcare Providers: seriousbroker.it  This test is not yet approved or cleared by the [place]  FDA and has been authorized for detection and/or diagnosis of SARS-CoV-2 by FDA under an Emergency Use Authorization (EUA). This EUA will remain in effect (meaning this test can be used) for the duration of the COVID-19 declaration under Section 564(b)(1) of the Act, 21 U.S.C. section 360bbb-3(b)(1), unless the authorization is terminated or revoked.     Resp Syncytial Virus by PCR NEGATIVE NEGATIVE Final    Comment: (NOTE) Fact Sheet for  Patients: bloggercourse.com  Fact Sheet for Healthcare Providers: seriousbroker.it  This test is not yet approved or cleared by the [place]  FDA and has been authorized for detection and/or diagnosis of SARS-CoV-2 by FDA under an Emergency Use Authorization (EUA). This EUA will remain in effect (meaning this test can be used) for the duration of the COVID-19 declaration under Section 564(b)(1) of the Act, 21 U.S.C. section 360bbb-3(b)(1), unless the authorization is terminated or revoked.  Performed at [facility], [address], [place], [place] [address]      Labs: Basic Metabolic Panel: Recent Labs  Lab 06/13/24 2103 06/14/24 0608 06/15/24 0310 06/16/24 0212 06/17/24 0259  NA 135 134* 137 135 136  K 4.3 4.4 3.3* 3.5 3.9  CL 101 100 98 98 102  CO2 25 26 28 26 26   GLUCOSE 111* 172* 130* 138* 117*  BUN 22 17 20 18 18   CREATININE 0.97 0.84 0.95 0.97 1.12*  CALCIUM  8.8* 9.1 8.9 8.3* 8.0*   Liver Function Tests: Recent Labs  Lab 06/14/24 0608 06/15/24 0310  AST 24 21  ALT 12 12  ALKPHOS 66 57  BILITOT 0.5 0.6  PROT 8.6* 8.0  ALBUMIN 3.7 3.5   No results for input(s): LIPASE, AMYLASE in the last 168 hours. No results for input(s): AMMONIA in the last 168 hours. CBC: Recent Labs  Lab 06/13/24 2103 06/14/24 0608 06/15/24 0310 06/17/24 0259  WBC 7.0 5.3 9.8 10.3  NEUTROABS 3.6  --   --   --   HGB 9.0* 9.3* 9.8* 9.5*  HCT 30.4* 31.3* 31.0* 30.2*  MCV 90.5 88.7 84.9 85.8  PLT 204 216 231 250   Cardiac Enzymes: No results for input(s): CKTOTAL, CKMB, CKMBINDEX, TROPONINI in the last 168 hours. BNP: BNP (last 3 results) No results for input(s): BNP in the last 8760 hours.  ProBNP (last 3 results) Recent Labs    06/13/24 2103  PROBNP 6,104.0*    CBG: Recent Labs  Lab 06/16/24 1551 06/16/24 2103 06/17/24 0323 06/17/24 0748 06/17/24 1142  GLUCAP 116* 138*  121* 145* 125*       Signed:  Toribio Hummer MD.  [facility] 06/17/2024, 12:54 PM       [1]  Allergies Allergen Reactions   Shellfish Allergy Anaphylaxis    ALL SEAFOOD ALLERGY   Ace Inhibitors Other (See Comments)    Allergy not listed on MAR    Angiotensin Receptor Blockers Other (See Comments)    Allergy not listed on MAR    Dust Mite Extract Other (See Comments)    Reaction not listed on MAR    Lactose Intolerance (Gi) Other (See Comments)  Reaction not listed on MAR    Lisinopril Other (See Comments)    Angioedema. Allergy not listed on MAR    Strawberry Extract Hives   Robaxin [Methocarbamol] Rash

## 2024-06-17 NOTE — TOC Transition Note (Signed)
 Transition of Care [facility]) - Discharge Note   Patient Details  Name: Bonnie Watkins MRN: [id number] Date of Birth: 20-Jul-1934  [facility] CM/SW Contact:  Luise JAYSON Pan, LCSWA Phone Number: 06/17/2024, 1:56 PM   Clinical Narrative:   Patient will DC to: [facility] Anticipated DC date: 06/17/24  Family notified: Therisa (dtr) Transport by: [facility]   Per MD patient ready for DC to [facility]. RN to call report prior to discharge [phone]; room 507). RN, patient, patient's family, and facility notified of DC. Discharge Summary and FL2 sent to facility. DC packet on chart. Ambulance transport requested for patient 1:57 PM.   CSW will sign off for now as social work intervention is no longer needed. Please consult us  again if new needs arise.      Final next level of care: Skilled Nursing Facility Barriers to Discharge: Barriers Resolved   Patient Goals and CMS Choice Patient states their goals for this hospitalization and ongoing recovery are:: To return to ltc          Discharge Placement              Patient chooses bed at: [facility] Patient to be transferred to facility by: [facility] Name of family member notified: Therisa (dtr)    Discharge Plan and Services Additional resources added to the After Visit Summary for   In-house Referral: Clinical Social Work                                   Social Drivers of Health (SDOH) Interventions SDOH Screenings   Food Insecurity: No Food Insecurity (06/14/2024)  Housing: Unknown (06/14/2024)  Transportation Needs: No Transportation Needs (06/14/2024)  Utilities: Not At Risk (06/14/2024)  Social Connections: Unknown (06/14/2024)  Tobacco Use: Medium Risk (12/11/2023)     Readmission Risk Interventions     No data to display
